# Patient Record
Sex: Male | Born: 1945 | Race: Black or African American | Hispanic: No | Marital: Single | Smoking: Light tobacco smoker
Health system: Southern US, Community
[De-identification: ages and names within clinical notes are randomized; demographics above are authoritative.]

## PROBLEM LIST (undated history)

## (undated) DIAGNOSIS — J449 Chronic obstructive pulmonary disease, unspecified: Secondary | ICD-10-CM

## (undated) DIAGNOSIS — I10 Essential (primary) hypertension: Secondary | ICD-10-CM

## (undated) DIAGNOSIS — N400 Enlarged prostate without lower urinary tract symptoms: Secondary | ICD-10-CM

## (undated) DIAGNOSIS — M1990 Unspecified osteoarthritis, unspecified site: Secondary | ICD-10-CM

## (undated) DIAGNOSIS — Z86010 Personal history of colonic polyps: Secondary | ICD-10-CM

## (undated) DIAGNOSIS — E119 Type 2 diabetes mellitus without complications: Secondary | ICD-10-CM

## (undated) DIAGNOSIS — E785 Hyperlipidemia, unspecified: Secondary | ICD-10-CM

## (undated) HISTORY — DX: Chronic obstructive pulmonary disease, unspecified: J44.9

## (undated) HISTORY — DX: Personal history of colonic polyps: Z86.010

## (undated) HISTORY — DX: Type 2 diabetes mellitus without complications: E11.9

## (undated) HISTORY — DX: Benign prostatic hyperplasia without lower urinary tract symptoms: N40.0

## (undated) HISTORY — DX: Unspecified osteoarthritis, unspecified site: M19.90

## (undated) HISTORY — DX: Hyperlipidemia, unspecified: E78.5

---

## 1994-08-13 HISTORY — PX: OTHER SURGICAL HISTORY: SHX169

## 2010-07-27 ENCOUNTER — Ambulatory Visit: Payer: Self-pay | Admitting: Internal Medicine

## 2010-07-27 DIAGNOSIS — I10 Essential (primary) hypertension: Secondary | ICD-10-CM | POA: Insufficient documentation

## 2010-07-27 DIAGNOSIS — J439 Emphysema, unspecified: Secondary | ICD-10-CM

## 2010-07-27 DIAGNOSIS — M1990 Unspecified osteoarthritis, unspecified site: Secondary | ICD-10-CM | POA: Insufficient documentation

## 2010-07-27 DIAGNOSIS — N400 Enlarged prostate without lower urinary tract symptoms: Secondary | ICD-10-CM

## 2010-07-27 DIAGNOSIS — E785 Hyperlipidemia, unspecified: Secondary | ICD-10-CM

## 2010-07-27 LAB — CONVERTED CEMR LAB
ALT: 15 units/L (ref 0–53)
BUN: 11 mg/dL (ref 6–23)
Basophils Absolute: 0.1 10*3/uL (ref 0.0–0.1)
Basophils Relative: 0.4 % (ref 0.0–3.0)
CO2: 28 meq/L (ref 19–32)
Calcium: 9.4 mg/dL (ref 8.4–10.5)
Cholesterol: 173 mg/dL (ref 0–200)
Creatinine, Ser: 1.1 mg/dL (ref 0.4–1.5)
Eosinophils Relative: 2.5 % (ref 0.0–5.0)
Glucose, Bld: 92 mg/dL (ref 70–99)
HDL: 34.8 mg/dL — ABNORMAL LOW (ref >39.00)
Lymphs Abs: 1.9 10*3/uL (ref 0.7–4.0)
MCV: 82.1 fL (ref 78.0–100.0)
Neutrophils Relative %: 75.5 % (ref 43.0–77.0)
RBC: 4.89 M/uL (ref 4.22–5.81)
Sodium: 141 meq/L (ref 135–145)
TSH: 0.63 microintl units/mL (ref 0.35–5.50)
Total Bilirubin: 0.6 mg/dL (ref 0.3–1.2)
Triglycerides: 66 mg/dL (ref 0.0–149.0)
WBC: 12.1 10*3/uL — ABNORMAL HIGH (ref 4.5–10.5)

## 2010-07-28 ENCOUNTER — Encounter: Payer: Self-pay | Admitting: Internal Medicine

## 2010-08-01 ENCOUNTER — Telehealth: Payer: Self-pay | Admitting: Internal Medicine

## 2010-08-01 ENCOUNTER — Ambulatory Visit: Payer: Self-pay | Admitting: Internal Medicine

## 2010-08-01 DIAGNOSIS — R911 Solitary pulmonary nodule: Secondary | ICD-10-CM | POA: Insufficient documentation

## 2010-08-24 ENCOUNTER — Ambulatory Visit
Admission: RE | Admit: 2010-08-24 | Discharge: 2010-08-24 | Payer: Self-pay | Source: Home / Self Care | Attending: Internal Medicine | Admitting: Internal Medicine

## 2010-08-24 DIAGNOSIS — F17200 Nicotine dependence, unspecified, uncomplicated: Secondary | ICD-10-CM | POA: Insufficient documentation

## 2010-08-29 ENCOUNTER — Ambulatory Visit: Payer: Self-pay | Admitting: Cardiology

## 2010-08-30 ENCOUNTER — Telehealth: Payer: Self-pay | Admitting: Internal Medicine

## 2010-09-06 ENCOUNTER — Other Ambulatory Visit: Payer: Self-pay | Admitting: Thoracic Surgery

## 2010-09-06 ENCOUNTER — Ambulatory Visit
Admission: RE | Admit: 2010-09-06 | Discharge: 2010-09-06 | Payer: Self-pay | Source: Home / Self Care | Attending: Thoracic Surgery | Admitting: Thoracic Surgery

## 2010-09-06 DIAGNOSIS — R918 Other nonspecific abnormal finding of lung field: Secondary | ICD-10-CM

## 2010-09-06 NOTE — Letter (Signed)
September 06, 2010  Sanda Linger, MD [address] [place] [address] [place] [address]  Re:  Jesse Garrison, KHAWAJA                  DOB:  Nov 16, 1945  Dear Dr. Yetta Barre:  I appreciate the opportunity of seeing the patient.  This 65 year old African American male has been a long time smoker who has recently just stopped smoking.  He had worked as a Naval architect.  He has had no hemoptysis, fever, chills, excessive sputum but his chest x-ray followed CT scan showed a right upper lobe lesion that was 18 x 15 cm in size. He also has severe emphysema on the right upper lobe as well as in the left upper lobe with a bullous type emphysema.  He gets short of breath when walking up a flight of stairs.  His medications include Ventolin HFA, terazosin, NicoDerm, Advair and Diovan HCTZ.  He is allergic to lisinopril.  He has hypertension, chronic obstructive pulmonary disease, osteoarthritis and BPH.  Family history is positive for diabetes.  SOCIAL HISTORY:  He is a Naval architect and is single.  He has four children.  He is retired.  Quit smoking 2 weeks ago, has 40-50 pack-year history.  Does not drink alcohol.  REVIEW OF SYSTEMS:  CONSTITUTIONAL:  He is 260 pounds.  He is 5 feet 9 inches. GENERAL:  His weight has been stable. CARDIAC:  He gets shortness of breath with exertion.  No angina or atrial fibrillation. PULMONARY:  No hemoptysis. GI:  No nausea, vomiting, constipation, diarrhea. GU:  No kidney disease, dysuria or frequent urination. VASCULAR:  No claudication, DVT, TIAs. NEUROLOGICAL:  No dizziness, headaches, blackouts, seizures. MUSCULOSKELETAL:  No arthritis. PSYCHIATRIC:  No depression or nervousness. EYES/ENT:  No changes in eyesight or hearing. HEMATOLOGICAL:  No problems with bleeding, clotting disorders, anemia.  PHYSICAL EXAMINATION:  He is a slightly obese African American male in no acute distress.  His blood pressure was 147/71, pulse 96, respirations 20, saturations were 93%.   Head, Eyes, Ears, Nose and Throat:  Unremarkable.  Neck:  Supple without thyromegaly.  There is no supraclavicular or axillary adenopathy.  Chest:  Distant breath sounds and bilateral wheezes.  Heart:  Regular sinus rhythm.  No murmurs. Abdomen:  Soft.  There is no hepatosplenomegaly.  Extremities:  Pulses are 2+.  There is no clubbing or edema.  Neurological:  He is oriented x3.  Sensory and motor intact.  Cranial nerves intact.  Hopefully, the patient has an early lung cancer.  We will plan to get PET scan on him, brain scan and pulmonary functions with DLCO.  At that time we will decide whether a biopsy is needed or not either by bronchoscopy or needle biopsy and then determine what his best treatment would be.  I appreciate the opportunity of seeing the patient.  Ines Bloomer, M.D. Electronically Signed  DPB/MEDQ  D:  09/06/2010  T:  09/06/2010  Job:  [id number]

## 2010-09-14 NOTE — Assessment & Plan Note (Signed)
Summary: New / [employer] / #./ cd   Vital Signs:  Patient profile:   65 year old male Height:      69 inches Weight:      251 pounds BMI:     37.20 O2 Sat:      93 % on Room air Temp:     98.5 degrees F oral Pulse rate:   78 / minute Pulse rhythm:   regular Resp:     16 per minute BP sitting:   166 / 80  (left arm) Cuff size:   large  Vitals Entered By: Rock Nephew CMA (July 27, 2010 3:53 PM)  Nutrition Counseling: Patient's BMI is greater than 25 and therefore counseled on weight management options.  O2 Flow:  Room air CC: New to establish  Does patient need assistance? Functional Status Self care Ambulation Normal   Primary Care Junella Domke:  Etta Grandchild MD  CC:  New to establish.  History of Present Illness: New to me he needs a new PCP, just moved to [place] fom [place]. He has a chronic NP cough and needs a nebulizer for albuterol/atrovent jet nebs.  He is having trouble with his right knee with chronic pain and now developing some instability.  Preventive Screening-Counseling & Management  Alcohol-Tobacco     Alcohol drinks/day: 0     Alcohol Counseling: not indicated; patient does not drink     Smoking Status: current     Smoking Cessation Counseling: yes     Smoke Cessation Stage: precontemplative     Packs/Day: 1.0     Year Started: 1961     Pack years: 68     Tobacco Counseling: to quit use of tobacco products  Caffeine-Diet-Exercise     Does Patient Exercise: no  Hep-HIV-STD-Contraception     Hepatitis Risk: no risk noted     HIV Risk: no risk noted     STD Risk: no risk noted      Sexual History:  currently monogamous.        Drug Use:  no.        Blood Transfusions:  no.    Medications Prior to Update: 1)  None  Current Medications (verified): 1)  Terazosin Hcl 5 Mg Caps (Terazosin Hcl) .... Take 1 Tablet By Mouth Once A Day 2)  Advair Diskus 250-50 Mcg/dose Aepb (Fluticasone-Salmeterol) .... One Puff Bid 3)  Diovan Hct 320-12.5 Mg Tabs  (Valsartan-Hydrochlorothiazide) .... One By Mouth Once Daily For High Blood Pressure 4)  Ventolin Hfa 108 (90 Base) Mcg/act Aers (Albuterol Sulfate) .Marland Kitchen.. 1-2 Puffs Qid As Needed For Wheezing  Allergies (verified): 1)  ! Lisinopril (Lisinopril)  Past History:  Past Medical History: COPD Hyperlipidemia Hypertension Osteoarthritis Benign prostatic hypertrophy  Past Surgical History: Denies surgical history  Family History: Family History Diabetes 1st degree relative  Social History: Occupation: truck Theme park manager Current Smoker Alcohol use-no Drug use-no Regular exercise-no Smoking Status:  current Drug Use:  no Does Patient Exercise:  no Packs/Day:  1.0 Hepatitis Risk:  no risk noted HIV Risk:  no risk noted STD Risk:  no risk noted Sexual History:  currently monogamous Blood Transfusions:  no  Review of Systems       The patient complains of weight gain, dyspnea on exertion, prolonged cough, and difficulty walking.  The patient denies anorexia, fever, weight loss, chest pain, syncope, peripheral edema, headaches, hemoptysis, abdominal pain, melena, hematochezia, severe indigestion/heartburn, hematuria, suspicious skin lesions, depression, unusual weight change, abnormal bleeding, enlarged lymph  nodes, and angioedema.   CV:  Complains of difficulty breathing at night and shortness of breath with exertion; denies chest pain or discomfort, difficulty breathing while lying down, fainting, fatigue, leg cramps with exertion, lightheadness, swelling of feet, swelling of hands, and weight gain. Resp:  Complains of cough, shortness of breath, and wheezing; denies chest discomfort, chest pain with inspiration, coughing up blood, excessive snoring, hypersomnolence, morning headaches, pleuritic, and sputum productive. GU:  Complains of urinary hesitancy; denies discharge, dysuria, hematuria, incontinence, nocturia, and urinary frequency.  Physical Exam  General:  alert,  well-developed, well-nourished, well-hydrated, appropriate dress, cooperative to examination, good hygiene, and overweight-appearing.   Head:  normocephalic, atraumatic, no abnormalities observed, and no abnormalities palpated.   Eyes:  vision grossly intact and pupils equal.   Mouth:  Oral mucosa and oropharynx without lesions or exudates.  Teeth in good repair. Neck:  supple, full ROM, no masses, no thyromegaly, no thyroid nodules or tenderness, no JVD, normal carotid upstroke, no carotid bruits, no cervical lymphadenopathy, and no neck tenderness.   Lungs:  He has diffuse bilateral exp rhonchi that clear with cough but good air movement, he is dyspneic with any activity. no intercostal retractions, no accessory muscle use, no dullness, no fremitus, no crackles, and no wheezes.   Heart:  normal rate, regular rhythm, no murmur, no gallop, no rub, and no JVD.   Abdomen:  soft, non-tender, normal bowel sounds, no distention, no masses, no guarding, no rigidity, no rebound tenderness, no abdominal hernia, no inguinal hernia, no hepatomegaly, and no splenomegaly.   Msk:  right knee has crepitance with ROM and obvious signs of DJD but no warmth or effusion and no instability Pulses:  R and L carotid,radial,femoral,dorsalis pedis and posterior tibial pulses are full and equal bilaterally Extremities:  No clubbing, cyanosis, edema, or deformity noted with normal full range of motion of all joints.   Neurologic:  alert & oriented X3, cranial nerves II-XII intact, strength normal in all extremities, sensation intact to light touch, sensation intact to pinprick, DTRs symmetrical and normal, finger-to-nose normal, heel-to-shin normal, and abnormal gait due to valgus deformity in right knee.   Skin:  turgor normal, color normal, no rashes, no suspicious lesions, no ecchymoses, no petechiae, no purpura, no ulcerations, and no edema.   Cervical Nodes:  no anterior cervical adenopathy and no posterior cervical  adenopathy.   Axillary Nodes:  no R axillary adenopathy and no L axillary adenopathy.   Inguinal Nodes:  no R inguinal adenopathy and no L inguinal adenopathy.   Psych:  Oriented X3, memory intact for recent and remote, normally interactive, good eye contact, not anxious appearing, not depressed appearing, not agitated, not suicidal, and not homicidal.     Impression & Recommendations:  Problem # 1:  COUGH (ICD-786.2) Assessment New stop the ACEI Orders: T-2 View CXR (71020TC) Venipuncture (40981) TLB-Lipid Panel (80061-LIPID) TLB-BMP (Basic Metabolic Panel-BMET) (80048-METABOL) TLB-Hepatic/Liver Function Pnl (80076-HEPATIC) TLB-TSH (Thyroid Stimulating Hormone) (84443-TSH) TLB-CBC Platelet - w/Differential (85025-CBCD) TLB-PSA (Prostate Specific Antigen) (84153-PSA)  Problem # 2:  OSTEOARTHRITIS (ICD-715.90) Assessment: New he may need right TKR Orders: Venipuncture (19147) TLB-Lipid Panel (80061-LIPID) TLB-BMP (Basic Metabolic Panel-BMET) (80048-METABOL) TLB-Hepatic/Liver Function Pnl (80076-HEPATIC) TLB-TSH (Thyroid Stimulating Hormone) (84443-TSH) TLB-CBC Platelet - w/Differential (85025-CBCD) TLB-PSA (Prostate Specific Antigen) (84153-PSA) Orthopedic Surgeon Referral (Ortho Surgeon)  Problem # 3:  BENIGN PROSTATIC HYPERTROPHY (ICD-600.00) Assessment: Unchanged  His updated medication list for this problem includes:    Terazosin Hcl 5 Mg Caps (Terazosin hcl) .Marland Kitchen... Take 1 tablet  by mouth once a day  Orders: Venipuncture (16109) TLB-Lipid Panel (80061-LIPID) TLB-BMP (Basic Metabolic Panel-BMET) (80048-METABOL) TLB-Hepatic/Liver Function Pnl (80076-HEPATIC) TLB-TSH (Thyroid Stimulating Hormone) (84443-TSH) TLB-CBC Platelet - w/Differential (85025-CBCD) TLB-PSA (Prostate Specific Antigen) (84153-PSA)  Problem # 4:  HYPERTENSION (ICD-401.9) Assessment: Deteriorated  The following medications were removed from the medication list:    Lisinopril 20 Mg Tabs  (Lisinopril) .Marland Kitchen... Take 1 tablet by mouth once a day    Hydrochlorothiazide 25 Mg Tabs (Hydrochlorothiazide) .Marland Kitchen... 1/2 once daily His updated medication list for this problem includes:    Terazosin Hcl 5 Mg Caps (Terazosin hcl) .Marland Kitchen... Take 1 tablet by mouth once a day    Diovan Hct 320-12.5 Mg Tabs (Valsartan-hydrochlorothiazide) ..... One by mouth once daily for high blood pressure  Orders: Venipuncture (60454) TLB-Lipid Panel (80061-LIPID) TLB-BMP (Basic Metabolic Panel-BMET) (80048-METABOL) TLB-Hepatic/Liver Function Pnl (80076-HEPATIC) TLB-TSH (Thyroid Stimulating Hormone) (84443-TSH) TLB-CBC Platelet - w/Differential (85025-CBCD) TLB-PSA (Prostate Specific Antigen) (84153-PSA)  BP today: 166/80  Problem # 5:  HYPERLIPIDEMIA (ICD-272.4) Assessment: Unchanged  Orders: Venipuncture (09811) TLB-Lipid Panel (80061-LIPID) TLB-BMP (Basic Metabolic Panel-BMET) (80048-METABOL) TLB-Hepatic/Liver Function Pnl (80076-HEPATIC) TLB-TSH (Thyroid Stimulating Hormone) (84443-TSH) TLB-CBC Platelet - w/Differential (85025-CBCD) TLB-PSA (Prostate Specific Antigen) (84153-PSA)  Complete Medication List: 1)  Terazosin Hcl 5 Mg Caps (Terazosin hcl) .... Take 1 tablet by mouth once a day 2)  Advair Diskus 250-50 Mcg/dose Aepb (Fluticasone-salmeterol) .... One puff bid 3)  Diovan Hct 320-12.5 Mg Tabs (Valsartan-hydrochlorothiazide) .... One by mouth once daily for high blood pressure 4)  Ventolin Hfa 108 (90 Base) Mcg/act Aers (Albuterol sulfate) .Marland Kitchen.. 1-2 puffs qid as needed for wheezing  Other Orders: Home Health Referral [facility]) Tdap => 21yrs IM (91478) Flu Vaccine 41yrs + MEDICARE PATIENTS (G9562) Administration Flu vaccine - MCR (Z3086)  Colorectal Screening:  Current Recommendations:    Colonoscopy recommended: patient refused  PSA Screening:    Reviewed PSA screening recommendations: PSA ordered  Immunization & Chemoprophylaxis:    Tetanus vaccine: Tdap  (07/27/2010)     Influenza vaccine: Fluvax MCR  (07/27/2010)  Patient Instructions: 1)  Please schedule a follow-up appointment in 1 month. 2)  Tobacco is very bad for your health and your loved ones! You Should stop smoking!. 3)  Stop Smoking Tips: Choose a Quit date. Cut down before the Quit date. decide what you will do as a substitute when you feel the urge to smoke(gum,toothpick,exercise). 4)  It is important that you exercise regularly at least 20 minutes 5 times a week. If you develop chest pain, have severe difficulty breathing, or feel very tired , stop exercising immediately and seek medical attention. 5)  You need to lose weight. Consider a lower calorie diet and regular exercise.  6)  Check your Blood Pressure regularly. If it is above 130/80: you should make an appointment. Prescriptions: ADVAIR DISKUS 250-50 MCG/DOSE AEPB (FLUTICASONE-SALMETEROL) One puff BID  #6 inhs x 0   Entered and Authorized by:   Etta Grandchild MD   Signed by:   Etta Grandchild MD on 07/27/2010   Method used:   Samples Given   RxID:   [id number] DIOVAN HCT 320-12.5 MG TABS (VALSARTAN-HYDROCHLOROTHIAZIDE) One by mouth once daily for high blood pressure  #112 x 0   Entered and Authorized by:   Etta Grandchild MD   Signed by:   Etta Grandchild MD on 07/27/2010   Method used:   Samples Given   RxID:   [id number]    Orders Added: 1)  Home Health  Referral [Home Health] 2)  T-2 View CXR [71020TC] 3)  Venipuncture [36415] 4)  TLB-Lipid Panel [80061-LIPID] 5)  TLB-BMP (Basic Metabolic Panel-BMET) [80048-METABOL] 6)  TLB-Hepatic/Liver Function Pnl [80076-HEPATIC] 7)  TLB-TSH (Thyroid Stimulating Hormone) [84443-TSH] 8)  TLB-CBC Platelet - w/Differential [85025-CBCD] 9)  TLB-PSA (Prostate Specific Antigen) [14782-NFA] 10)  Orthopedic Surgeon Referral [Ortho Surgeon] 11)  Tdap => 35yrs IM [90715] 12)  Flu Vaccine 25yrs + MEDICARE PATIENTS [Q2039] 13)  Administration Flu vaccine - MCR [G0008] 14)  New Patient  Level IV [21308]   Immunizations Administered:  Tetanus Vaccine:    Vaccine Type: Tdap    Site: right deltoid    Mfr: GlaxoSmithKline    Dose: 0.5 ml    Route: IM    Given by: Rock Nephew CMA    Exp. Date: 06/01/2012    Lot #: [id number]    VIS given: 06/30/08 version given July 27, 2010.   Immunizations Administered:  Tetanus Vaccine:    Vaccine Type: Tdap    Site: right deltoid    Mfr: GlaxoSmithKline    Dose: 0.5 ml    Route: IM    Given by: Rock Nephew CMA    Exp. Date: 06/01/2012    Lot #: [id number]    VIS given: 06/30/08 version given July 27, 2010.  Influenza Vaccine (to be given today)

## 2010-09-14 NOTE — Letter (Signed)
Summary: Results Follow-up Letter  [facility]  [address] [place], [place] [address]   Phone: [phone]  Fax: [phone]    07/28/2010  [address] [place], [place]  [address]  Dear Mr. JUSTEN,   The following are the results of your recent test(s):  Test     Result     CBC       WBC count is slightly elevated Liver/kidney   normal Prostate     normal Thyroid     normal   _________________________________________________________  Please call for an appointment soon _________________________________________________________ _________________________________________________________ _________________________________________________________  Sincerely,  Sanda Linger MD [facility]

## 2010-09-14 NOTE — Letter (Signed)
Summary: Lipid Letter  [facility]  [address] [place], [place] [address]   Phone: [phone]  Fax: [phone]    07/28/2010  Ka Flammer [address] [place], [place]  [address]  Dear Reuel Boom:  We have carefully reviewed your last lipid profile from 07/27/2010 and the results are noted below with a summary of recommendations for lipid management.    Cholesterol:       173     Goal: <200   HDL "good" Cholesterol:   01.02     Goal: >40   LDL "bad" Cholesterol:   125     Goal: <130   Triglycerides:       66.0     Goal: <150        TLC Diet (Therapeutic Lifestyle Change): Saturated Fats & Transfatty acids should be kept < 7% of total calories ***Reduce Saturated Fats Polyunstaurated Fat can be up to 10% of total calories Monounsaturated Fat Fat can be up to 20% of total calories Total Fat should be no greater than 25-35% of total calories Carbohydrates should be 50-60% of total calories Protein should be approximately 15% of total calories Fiber should be at least 20-30 grams a day ***Increased fiber may help lower LDL Total Cholesterol should be < 200mg /day Consider adding plant stanol/sterols to diet (example: Benacol spread) ***A higher intake of unsaturated fat may reduce Triglycerides and Increase HDL    Adjunctive Measures (may lower LIPIDS and reduce risk of Heart Attack) include: Aerobic Exercise (20-30 minutes 3-4 times a week) Limit Alcohol Consumption Weight Reduction Aspirin 75-81 mg a day by mouth (if not allergic or contraindicated) Dietary Fiber 20-30 grams a day by mouth     Current Medications: 1)    Terazosin Hcl 5 Mg Caps (Terazosin hcl) .... Take 1 tablet by mouth once a day 2)    Advair Diskus 250-50 Mcg/dose Aepb (Fluticasone-salmeterol) .... One puff bid 3)    Diovan Hct 320-12.5 Mg Tabs (Valsartan-hydrochlorothiazide) .... One by mouth once daily for high blood pressure 4)    Ventolin Hfa 108 (90 Base) Mcg/act Aers (Albuterol  sulfate) .Marland Kitchen.. 1-2 puffs qid as needed for wheezing  If you have any questions, please call. We appreciate being able to work with you.   Sincerely,    [facility] Etta Grandchild MD

## 2010-09-14 NOTE — Progress Notes (Signed)
  Phone Note Outgoing Call   Summary of Call: LA - please let him know that he needs to see a chest surgeon Initial call taken by: Etta Grandchild MD,  August 30, 2010 7:30 AM     Appended Document:  Patient notified per MD. I advised to come in for follow up to discuss however pt stated that he is starting a new job and doesnt have copay. I advised of report and pt will wait until they call with referral info.Alvy Beal A, CMA

## 2010-09-14 NOTE — Assessment & Plan Note (Signed)
Summary: 1 MOS F/U / #/ CD   Vital Signs:  Patient profile:   65 year old male Height:      69 inches Weight:      245 pounds O2 Sat:      94 % on Room air Temp:     98.7 degrees F oral Pulse rate:   80 / minute Pulse rhythm:   regular Resp:     16 per minute BP sitting:   120 / 58  (left arm)  O2 Flow:  Room air  Primary Care Provider:  Etta Grandchild MD   History of Present Illness: He returns for f/up and tells me that he is feeling at his baseline level. He has not received his nebulizer yet b/c [employer] did not approve the provider that we referred him to. His CT scan is scheduled for today.  Hypertension History:      He denies headache, chest pain, palpitations, dyspnea with exertion, orthopnea, PND, peripheral edema, visual symptoms, neurologic problems, syncope, and side effects from treatment.  He notes no problems with any antihypertensive medication side effects.        Positive major cardiovascular risk factors include male age 1 years old or older, hyperlipidemia, hypertension, and current tobacco user.  Negative major cardiovascular risk factors include no history of diabetes.        Further assessment for target organ damage reveals no history of ASHD, cardiac end-organ damage (CHF/LVH), stroke/TIA, peripheral vascular disease, renal insufficiency, or hypertensive retinopathy.     Preventive Screening-Counseling & Management  Alcohol-Tobacco     Smoking Cessation Counseling: yes  Allergies: 1)  ! Lisinopril (Lisinopril)  Past History:  Past Medical History: Last updated: 07/27/2010 COPD Hyperlipidemia Hypertension Osteoarthritis Benign prostatic hypertrophy  Past Surgical History: Last updated: 07/27/2010 Denies surgical history  Family History: Last updated: 07/27/2010 Family History Diabetes 1st degree relative  Social History: Last updated: 07/27/2010 Occupation: truck driver Single Current Smoker Alcohol use-no Drug use-no Regular  exercise-no  Risk Factors: Alcohol Use: 0 (07/27/2010) Exercise: no (07/27/2010)  Risk Factors: Smoking Status: current (07/27/2010) Packs/Day: 1.0 (07/27/2010)  Family History: Reviewed history from 07/27/2010 and no changes required. Family History Diabetes 1st degree relative  Social History: Reviewed history from 07/27/2010 and no changes required. 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  The patient denies anorexia, fever, weight loss, chest pain, syncope, peripheral edema, prolonged cough, headaches, hemoptysis, abdominal pain, hematuria, suspicious skin lesions, unusual weight change, enlarged lymph nodes, and angioedema.   Resp:  Complains of shortness of breath and wheezing; denies chest discomfort, chest pain with inspiration, cough, coughing up blood, excessive snoring, and sputum productive. GU:  Complains of urinary hesitancy; denies dysuria, hematuria, incontinence, nocturia, and urinary frequency.  Physical Exam  General:  alert, well-developed, well-nourished, well-hydrated, appropriate dress, cooperative to examination, good hygiene, and overweight-appearing.   Head:  normocephalic, atraumatic, no abnormalities observed, and no abnormalities palpated.   Neck:  supple, full ROM, no masses, no thyromegaly, no thyroid nodules or tenderness, no JVD, normal carotid upstroke, no carotid bruits, no cervical lymphadenopathy, and no neck tenderness.   Lungs:  He has diffuse bilateral exp rhonchi that clear with cough but good air movement, he is dyspneic with any activity. no intercostal retractions, no accessory muscle use, no dullness, no fremitus, no crackles, and no wheezes.   Heart:  normal rate, regular rhythm, no murmur, no gallop, no rub, and no JVD.   Abdomen:  soft, non-tender, normal bowel sounds, no distention, no masses,  no guarding, no rigidity, no rebound tenderness, no abdominal hernia, no inguinal hernia, no hepatomegaly, and no splenomegaly.   Msk:  right knee has crepitance with ROM and obvious signs of DJD but no warmth or effusion and no instability Pulses:  R and L carotid,radial,femoral,dorsalis pedis and posterior tibial pulses are full and equal bilaterally Extremities:  No clubbing, cyanosis, edema, or deformity noted with normal full range of motion of all joints.   Neurologic:  alert & oriented X3, cranial nerves II-XII intact, strength normal in all extremities, sensation intact to light touch, sensation intact to pinprick, DTRs symmetrical and normal, finger-to-nose normal, heel-to-shin normal, and abnormal gait due to valgus deformity in right knee.   Skin:  turgor normal, color normal, no rashes, no suspicious lesions, no ecchymoses, no petechiae, no purpura, no ulcerations, and no edema.   Cervical Nodes:  no anterior cervical adenopathy and no posterior cervical adenopathy.   Psych:  Oriented X3, memory intact for recent and remote, normally interactive, good eye contact, not anxious appearing, not depressed appearing, not agitated, not suicidal, and not homicidal.     Impression & Recommendations:  Problem # 1:  TOBACCO USE (ICD-305.1) Assessment New  His updated medication list for this problem includes:    Nicoderm Cq 21 Mg/24hr Pt24 (Nicotine) .Marland Kitchen... Apply one once daily for 2 weeks    Nicoderm Cq 14 Mg/24hr Pt24 (Nicotine) .Marland Kitchen... Apply one once daily for 14 days    Nicoderm Cq 7 Mg/24hr Pt24 (Nicotine) .Marland Kitchen... Apply one once daily for 14 days  Encouraged smoking cessation and discussed different methods for smoking cessation.   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 Take 1 tablet by mouth once a day  Problem # 4:  HYPERTENSION (ICD-401.9) Assessment: Improved  His updated medication list for this problem includes:    Terazosin Hcl 5 Mg Caps (Terazosin hcl) .Marland Kitchen... Take 1 tablet by mouth once a day    Diovan Hct 320-12.5 Mg Tabs (Valsartan-hydrochlorothiazide) ..... One by mouth once daily for high blood pressure  Orders: Tobacco use cessation intermediate 3-10 minutes (99406)  BP today: 120/58 Prior BP: 166/80 (07/27/2010)  10 Yr Risk Heart Disease: 33 %  Labs Reviewed: K+: 4.3 (07/27/2010) Creat: : 1.1 (07/27/2010)   Chol: 173 (07/27/2010)   HDL: 34.80 (07/27/2010)   LDL: 125 (07/27/2010)   TG: 66.0 (07/27/2010)  Problem # 5:  COPD (ICD-496) Assessment: Unchanged  His updated medication list for this problem includes:    Advair Diskus 250-50 Mcg/dose Aepb (Fluticasone-salmeterol) ..... One puff bid    Ventolin Hfa 108 (90 Base) Mcg/act Aers (Albuterol sulfate) .Marland Kitchen... 1-2 puffs qid as needed for wheezing  Orders: Tobacco use cessation intermediate 3-10 minutes (99406) Home Health Referral (Home Health)  Complete Medication List: 1)  Terazosin Hcl 5 Mg Caps (Terazosin hcl) .... Take 1 tablet by mouth once a day 2)  Advair Diskus 250-50 Mcg/dose Aepb (Fluticasone-salmeterol) .... One puff bid 3)  Diovan Hct 320-12.5 Mg Tabs (Valsartan-hydrochlorothiazide) .... One by mouth once daily for high blood pressure 4)  Ventolin Hfa 108 (  90 Base) Mcg/act Aers (Albuterol sulfate) .Marland Kitchen.. 1-2 puffs qid as needed for wheezing 5)  Nicoderm Cq 21 Mg/24hr Pt24 (Nicotine) .... Apply one once daily for 2 weeks 6)  Nicoderm Cq 14 Mg/24hr Pt24 (Nicotine) .... Apply one once daily for 14 days 7)  Nicoderm Cq 7 Mg/24hr Pt24 (Nicotine) .... Apply one once daily for 14 days  Hypertension Assessment/Plan:      The patient's hypertensive risk group is category B: At  least one risk factor (excluding diabetes) with no target organ damage.  His calculated 10 year risk of coronary heart disease is 33 %.  Today's blood pressure is 120/58.  His blood pressure goal is < 140/90.  Patient Instructions: 1)  Please schedule a follow-up appointment in 2 months. 2)  Tobacco is very bad for your health and your loved ones! You Should stop smoking!. 3)  Stop Smoking Tips: Choose a Quit date. Cut down before the Quit date. decide what you will do as a substitute when you feel the urge to smoke(gum,toothpick,exercise). 4)  It is important that you exercise regularly at least 20 minutes 5 times a week. If you develop chest pain, have severe difficulty breathing, or feel very tired , stop exercising immediately and seek medical attention. 5)  You need to lose weight. Consider a lower calorie diet and regular exercise.  6)  Check your Blood Pressure regularly. If it is above 140/90: you should make an appointment. Prescriptions: VENTOLIN HFA 108 (90 BASE) MCG/ACT AERS (ALBUTEROL SULFATE) 1-2 puffs QID as needed for wheezing  #1 inh x 11   Entered and Authorized by:   Etta Grandchild MD   Signed by:   Etta Grandchild MD on 08/24/2010   Method used:   Electronically to        [facility]  [address] [id number] (retail)       [address]       [place], [place]  [address]       Ph: [phone] or [phone]       Fax: [phone]   RxID:   [id number] TERAZOSIN HCL 5 MG CAPS (TERAZOSIN HCL) Take 1 tablet by mouth once a day  #30 x 11   Entered and Authorized by:   Etta Grandchild MD   Signed by:   Etta Grandchild MD on 08/24/2010   Method used:   Electronically to        [facility]  [address] [id number] (ret[facility])       [address]       [place], [place]  [address]       Ph: [phone] or [phone]       Fax: [phone]   RxID:   [id number] NICODERM CQ 7 MG/24HR PT24 (NICOTINE) Apply one once daily for 14 days  #14 x 0   Entered  and Authorized by:   Etta Grandchild MD   Signed by:   Etta Grandchild MD on 08/24/2010   Method used:   [facility]ronically to        [facility]  [address] [id number] ([facility]       [address]       [place], [place]  [address]       Ph: [phone] or [phone]       Fax: [phone]   RxID:   [id number] NICODERM CQ 14 MG/24HR  PT24 (NICOTINE) apply one once daily for 14 days  #14 x 0   Entered and Authorized by:   Etta Grandchild MD   Signed by:   Etta Grandchild MD on 08/24/2010   Method used:   [facility]ronically to        [facility]  [address] [id number] ([facility]       [address]       [place], [place]  [address]       Ph: [phone] or [phone]       Fax: [phone]   RxID:   [id number] NICODERM CQ 21 MG/24HR PT24 (NICOTINE) apply one once daily for 2 weeks  #14 x 0   Entered and Authorized by:   Etta Grandchild MD   Signed by:   Etta Grandchild MD on 08/24/2010   Method used:   [facility]ronically to        [facility]  [address] [id number]       [address]       [place], [place]  [address]       Ph: [phone] or [phone]       Fax: [phone]   RxID:   [id number]    Orders Added: 1)  Tobacco use cessation intermediate 3-10 minutes [99406] 2)  Home Health Referral [facility]] 3)  Radiology Referral [Radiology] 4)  Est. Patient Level IV [00867]

## 2010-09-14 NOTE — Progress Notes (Signed)
     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

## 2010-09-15 ENCOUNTER — Encounter (HOSPITAL_COMMUNITY): Payer: Self-pay

## 2010-09-15 ENCOUNTER — Encounter (HOSPITAL_COMMUNITY)
Admission: RE | Admit: 2010-09-15 | Discharge: 2010-09-15 | Disposition: A | Discharge status: Home / Self Care | Payer: Medicare PPO | Source: Ambulatory Visit | Attending: Thoracic Surgery | Admitting: Thoracic Surgery

## 2010-09-15 ENCOUNTER — Ambulatory Visit (HOSPITAL_COMMUNITY)
Admission: RE | Admit: 2010-09-15 | Discharge: 2010-09-15 | Disposition: A | Discharge status: Home / Self Care | Payer: Medicare PPO | Source: Ambulatory Visit | Attending: Thoracic Surgery | Admitting: Thoracic Surgery

## 2010-09-15 DIAGNOSIS — R222 Localized swelling, mass and lump, trunk: Secondary | ICD-10-CM | POA: Insufficient documentation

## 2010-09-15 DIAGNOSIS — J449 Chronic obstructive pulmonary disease, unspecified: Secondary | ICD-10-CM | POA: Insufficient documentation

## 2010-09-15 DIAGNOSIS — R0989 Other specified symptoms and signs involving the circulatory and respiratory systems: Secondary | ICD-10-CM | POA: Insufficient documentation

## 2010-09-15 DIAGNOSIS — J988 Other specified respiratory disorders: Secondary | ICD-10-CM | POA: Insufficient documentation

## 2010-09-15 DIAGNOSIS — I10 Essential (primary) hypertension: Secondary | ICD-10-CM | POA: Insufficient documentation

## 2010-09-15 DIAGNOSIS — R0609 Other forms of dyspnea: Secondary | ICD-10-CM | POA: Insufficient documentation

## 2010-09-15 DIAGNOSIS — J984 Other disorders of lung: Secondary | ICD-10-CM | POA: Insufficient documentation

## 2010-09-15 DIAGNOSIS — D381 Neoplasm of uncertain behavior of trachea, bronchus and lung: Secondary | ICD-10-CM | POA: Insufficient documentation

## 2010-09-15 DIAGNOSIS — Z79899 Other long term (current) drug therapy: Secondary | ICD-10-CM | POA: Insufficient documentation

## 2010-09-15 DIAGNOSIS — R062 Wheezing: Secondary | ICD-10-CM | POA: Insufficient documentation

## 2010-09-15 DIAGNOSIS — F17200 Nicotine dependence, unspecified, uncomplicated: Secondary | ICD-10-CM | POA: Insufficient documentation

## 2010-09-15 DIAGNOSIS — R918 Other nonspecific abnormal finding of lung field: Secondary | ICD-10-CM

## 2010-09-15 DIAGNOSIS — J4489 Other specified chronic obstructive pulmonary disease: Secondary | ICD-10-CM | POA: Insufficient documentation

## 2010-09-15 DIAGNOSIS — E7800 Pure hypercholesterolemia, unspecified: Secondary | ICD-10-CM | POA: Insufficient documentation

## 2010-09-15 HISTORY — DX: Essential (primary) hypertension: I10

## 2010-09-15 MED ORDER — IOHEXOL 300 MG/ML  SOLN
100.0000 mL | Freq: Once | INTRAMUSCULAR | Status: AC | PRN
  Administered 2010-09-15: 100 mL via INTRAVENOUS

## 2010-09-15 MED ORDER — FLUDEOXYGLUCOSE F - 18 (FDG) INJECTION: 17.1000 | Freq: Once | INTRAVENOUS | Status: DC | PRN

## 2010-09-19 ENCOUNTER — Ambulatory Visit (INDEPENDENT_AMBULATORY_CARE_PROVIDER_SITE_OTHER): Payer: Medicare PPO | Admitting: Thoracic Surgery

## 2010-09-19 ENCOUNTER — Encounter: Payer: Self-pay | Admitting: Internal Medicine

## 2010-09-19 DIAGNOSIS — D381 Neoplasm of uncertain behavior of trachea, bronchus and lung: Secondary | ICD-10-CM

## 2010-09-22 NOTE — Letter (Signed)
September 19, 2010  Sanda Linger, MD [address] [place] [address] [place] [address]  Re:  SHAARAV, RIPPLE                  DOB:  Feb 20, 1946  Dear Dr. Yetta Barre:  I saw the patient back after his PET scan, brain scan and pulmonary function tests.  His brain scan was negative.  His pulmonary function tests showed an FEV-1 of 1.07 which is 37% of predicted with a diffusion capacity of 36% of predicted, but he has obviously severe obstructive pulmonary disease with decreased diffusion capacity.  His PET scan, however, did not show any uptake in the lesion which could go along, this could be some type of either a carcinoid or a hamartoma or possibly a low-grade cancer.  Given his poor pulmonary function tests and that he has only quit smoking for 1 month, I think it would be safe to watch him and I will see him back in 3 months and at that time if there is any change in this nodule, then we will proceed with some type of biopsy probably we will use electromagnetic navigation bronchoscopy.  His blood pressure was 141/69, pulse 80, respirations 18, sats were 91%.  Ines Bloomer, M.D. Electronically Signed  DPB/MEDQ  D:  09/19/2010  T:  09/20/2010  Job:  [id number]

## 2010-10-10 NOTE — Letter (Signed)
Summary: [facility]  [facility]   Imported By: Lennie Odor 10/04/2010 12:00:39  _____________________________________________________________________  External Attachment:    Type:   Image     Comment:   External Document

## 2010-11-17 ENCOUNTER — Other Ambulatory Visit: Payer: Self-pay | Admitting: Thoracic Surgery

## 2010-11-17 DIAGNOSIS — R911 Solitary pulmonary nodule: Secondary | ICD-10-CM

## 2010-12-06 ENCOUNTER — Other Ambulatory Visit: Payer: Self-pay | Admitting: *Deleted

## 2010-12-06 MED ORDER — VALSARTAN-HYDROCHLOROTHIAZIDE 320-12.5 MG PO TABS: 1.0000 | ORAL_TABLET | Freq: Every day | ORAL | Status: DC

## 2010-12-20 ENCOUNTER — Ambulatory Visit: Payer: Medicare PPO | Admitting: Thoracic Surgery

## 2010-12-20 ENCOUNTER — Other Ambulatory Visit: Payer: Medicare PPO

## 2011-04-12 ENCOUNTER — Other Ambulatory Visit (INDEPENDENT_AMBULATORY_CARE_PROVIDER_SITE_OTHER): Payer: Medicare PPO

## 2011-04-12 ENCOUNTER — Encounter: Payer: Self-pay | Admitting: Internal Medicine

## 2011-04-12 ENCOUNTER — Ambulatory Visit (INDEPENDENT_AMBULATORY_CARE_PROVIDER_SITE_OTHER)
Admission: RE | Admit: 2011-04-12 | Discharge: 2011-04-12 | Disposition: A | Discharge status: Home / Self Care | Payer: Medicare PPO | Source: Ambulatory Visit | Attending: Internal Medicine | Admitting: Internal Medicine

## 2011-04-12 ENCOUNTER — Ambulatory Visit (INDEPENDENT_AMBULATORY_CARE_PROVIDER_SITE_OTHER): Payer: Medicare PPO | Admitting: Internal Medicine

## 2011-04-12 VITALS — BP 124/60 | HR 72 | Temp 98.2°F | Resp 16 | Wt 250.0 lb

## 2011-04-12 DIAGNOSIS — I10 Essential (primary) hypertension: Secondary | ICD-10-CM

## 2011-04-12 DIAGNOSIS — R222 Localized swelling, mass and lump, trunk: Secondary | ICD-10-CM

## 2011-04-12 DIAGNOSIS — J4489 Other specified chronic obstructive pulmonary disease: Secondary | ICD-10-CM

## 2011-04-12 DIAGNOSIS — R05 Cough: Secondary | ICD-10-CM

## 2011-04-12 DIAGNOSIS — J449 Chronic obstructive pulmonary disease, unspecified: Secondary | ICD-10-CM

## 2011-04-12 DIAGNOSIS — R059 Cough, unspecified: Secondary | ICD-10-CM

## 2011-04-12 LAB — COMPREHENSIVE METABOLIC PANEL
AST: 19 U/L (ref 0–37)
Alkaline Phosphatase: 56 U/L (ref 39–117)
BUN: 20 mg/dL (ref 6–23)
Creatinine, Ser: 1.4 mg/dL (ref 0.4–1.5)
Potassium: 3.9 mEq/L (ref 3.5–5.1)
Total Bilirubin: 0.7 mg/dL (ref 0.3–1.2)

## 2011-04-12 LAB — CBC WITH DIFFERENTIAL/PLATELET
Basophils Relative: 0.3 % (ref 0.0–3.0)
Eosinophils Absolute: 0.3 10*3/uL (ref 0.0–0.7)
MCHC: 32.5 g/dL (ref 30.0–36.0)
MCV: 80.9 fl (ref 78.0–100.0)
Monocytes Absolute: 0.7 10*3/uL (ref 0.1–1.0)
Neutrophils Relative %: 74.8 % (ref 43.0–77.0)
RBC: 5.27 Mil/uL (ref 4.22–5.81)

## 2011-04-12 MED ORDER — ROFLUMILAST 500 MCG PO TABS: 500.0000 ug | ORAL_TABLET | Freq: Every day | ORAL | Status: DC

## 2011-04-12 MED ORDER — ALBUTEROL SULFATE HFA 108 (90 BASE) MCG/ACT IN AERS: 2.0000 | INHALATION_SPRAY | Freq: Four times a day (QID) | RESPIRATORY_TRACT | Status: DC

## 2011-04-12 NOTE — Progress Notes (Signed)
Subjective:    Patient ID: Jesse Garrison, male    DOB: Jun 02, 1946, 65 y.o.   MRN: [id number]  Cough This is a recurrent problem. The current episode started more than 1 year ago. The problem has been gradually worsening. The problem occurs every few hours. The cough is productive of sputum. Pertinent negatives include no chest pain, chills, ear congestion, ear pain, fever, headaches, heartburn, hemoptysis, myalgias, nasal congestion, postnasal drip, rash, rhinorrhea, sore throat, shortness of breath, sweats, weight loss or wheezing. The symptoms are aggravated by fumes. Risk factors for lung disease include smoking/tobacco exposure. He has tried steroid inhaler and a beta-agonist inhaler for the symptoms. The treatment provided mild relief. His past medical history is significant for COPD and emphysema. There is no history of asthma, bronchiectasis, bronchitis, environmental allergies or pneumonia.  Hypertension This is a chronic problem. The current episode started more than 1 year ago. The problem has been gradually improving since onset. The problem is controlled. Pertinent negatives include no anxiety, blurred vision, chest pain, headaches, malaise/fatigue, neck pain, orthopnea, palpitations, peripheral edema, PND, shortness of breath or sweats. There are no associated agents to hypertension. Past treatments include diuretics and angiotensin blockers. The current treatment provides significant improvement. Compliance problems include exercise and diet.       Review of Systems  Constitutional: Negative for fever, chills, weight loss, malaise/fatigue, diaphoresis, activity change, appetite change, fatigue and unexpected weight change.  HENT: Negative for ear pain, sore throat, rhinorrhea, trouble swallowing, neck pain, voice change and postnasal drip.   Eyes: Negative.  Negative for blurred vision.  Respiratory: Positive for cough. Negative for apnea, hemoptysis, choking, chest tightness, shortness  of breath, wheezing and stridor.   Cardiovascular: Negative for chest pain, palpitations, orthopnea, leg swelling and PND.  Gastrointestinal: Negative for heartburn, nausea, vomiting, abdominal pain, diarrhea, constipation, blood in stool, abdominal distention, anal bleeding and rectal pain.  Genitourinary: Negative for dysuria, urgency, frequency, hematuria, flank pain, decreased urine volume, enuresis and difficulty urinating.  Musculoskeletal: Negative for myalgias, back pain, joint swelling, arthralgias and gait problem.  Skin: Negative for color change, pallor, rash and wound.  Neurological: Negative for dizziness, tremors, seizures, syncope, facial asymmetry, speech difficulty, weakness, light-headedness, numbness and headaches.  Hematological: Negative for environmental allergies and adenopathy. Does not bruise/bleed easily.  Psychiatric/Behavioral: Negative.        Objective:   Physical Exam  Vitals reviewed. Constitutional: He is oriented to person, place, and time. He appears well-developed and well-nourished. No distress.  HENT:  Right Ear: External ear normal.  Mouth/Throat: Oropharynx is clear and moist. No oropharyngeal exudate.  Eyes: Conjunctivae are normal. Right eye exhibits no discharge. Left eye exhibits no discharge. No scleral icterus.  Neck: Normal range of motion. Neck supple. No JVD present. No tracheal deviation present. No thyromegaly present.  Cardiovascular: Normal rate, regular rhythm, normal heart sounds and intact distal pulses.  Exam reveals no gallop and no friction rub.   No murmur heard. Pulmonary/Chest: Effort normal. No accessory muscle usage or stridor. Not tachypneic. No respiratory distress. He has no decreased breath sounds. He has wheezes in the right middle field and the left middle field. He has rhonchi in the right middle field and the left middle field. He has no rales. He exhibits no tenderness.  Abdominal: Soft. Bowel sounds are normal. He  exhibits no distension. There is no tenderness. There is no rebound and no guarding.  Musculoskeletal: Normal range of motion. He exhibits no edema and no tenderness.  Lymphadenopathy:  He has no cervical adenopathy.  Neurological: He is alert and oriented to person, place, and time. He has normal reflexes. He displays normal reflexes. He exhibits normal muscle tone.  Skin: Skin is warm and dry. No rash noted. He is not diaphoretic. No erythema. No pallor.  Psychiatric: He has a normal mood and affect. His behavior is normal. Judgment and thought content normal.      Lab Results  Component Value Date   WBC 12.1* 07/27/2010   HGB 13.4 07/27/2010   HCT 40.1 07/27/2010   PLT 180.0 07/27/2010   CHOL 173 07/27/2010   TRIG 66.0 07/27/2010   HDL 34.80* 07/27/2010   ALT 15 07/27/2010   AST 18 07/27/2010   NA 141 07/27/2010   K 4.3 07/27/2010   CL 106 07/27/2010   CREATININE 1.1 07/27/2010   BUN 11 07/27/2010   CO2 28 07/27/2010   TSH 0.63 07/27/2010   PSA 2.97 07/27/2010      Assessment & Plan:

## 2011-04-12 NOTE — Assessment & Plan Note (Signed)
His BP is well controlled, I will check his lytes and renal function today 

## 2011-04-12 NOTE — Assessment & Plan Note (Signed)
I will check a cxr today to look for pna

## 2011-04-12 NOTE — Assessment & Plan Note (Signed)
His symptoms continue to worsen in part due to non-compliance with the advair to due the cost and in part due to the continued abuse of cigarettes. I gave him samples of advair diskus today and asked him to stop smoking

## 2011-04-12 NOTE — Patient Instructions (Signed)
Chronic Obstructive Pulmonary Disease (COPD) Chronic obstructive pulmonary disease (COPD) is a condition in which airflow from the lungs is restricted. The lungs can never return to normal, but there are measures you can take which will improve them and make you feel better. CAUSES  Smoking.   Breathing in irritants (pollution, cigarette smoke, strong odors, aerosol sprays, paint fumes).   History of lung infections.  TREATMENT  Treatment focuses on making you comfortable (supportive care).  HOME CARE INSTRUCTIONS  If you smoke, stop smoking. The carbon monoxide buildup in the blood robs you of your already short oxygen supply.   Take medicines (antibiotics) that kill germs as directed.   Avoid antihistamines and cough syrups. They dry up your system and slow down the elimination of secretions. This decreases respiratory capacity and may lead to infections.   Drink enough water and fluids to keep your urine clear or pale yellow. This loosens secretions.   Use humidifiers at home and at your bedside if they do not make breathing difficult.   Receive all protective vaccines your caregiver suggests, especially pneumococcal and influenza.   Use home oxygen as suggested.  SEEK MEDICAL CARE IF:  You develop pus-like mucus (sputum).   You have an oral temperature above 100.5.   Breathing is more labored or exercise becomes difficult to do.   You are running out of the medicine you take for your breathing.  SEEK IMMEDIATE MEDICAL CARE IF:  You have a rapid heart rate.   You have agitation, confusion, tremors, or are in a stupor (family members may need to observe this).   It becomes difficult to breathe.   You develop chest pain.  You have an oral temperature above 100.5Hypertension (High Blood Pressure) As your heart beats, it forces blood through your arteries. This force is your blood pressure. If the pressure is too high, it is called hypertension (HTN) or high blood  pressure. HTN is dangerous because you may have it and not know it. High blood pressure may mean that your heart has to work harder to pump blood. Your arteries may be narrow or stiff. The extra work puts you at risk for heart disease, stroke, and other problems.  Blood pressure consists of two numbers, a higher number over a lower, 110/72, for example. It is stated as "110 over 72." The ideal is below 120 for the top number (systolic) and under 80 for the bottom (diastolic). Write down your blood pressure today. You should pay close attention to your blood pressure if you have certain conditions such as: Heart failure. Prior heart attack.  Diabetes  Chronic kidney disease.  Prior stroke.  Multiple risk factors for heart disease.   To see if you have HTN, your blood pressure should be measured while you are seated with your arm held at the level of the heart. It should be measured at least twice. A one-time elevated blood pressure reading (especially in the Emergency Department) does not mean that you need treatment. There may be conditions in which the blood pressure is different between your right and left arms. It is important to see your caregiver soon for a recheck. Most people have essential hypertension which means that there is not a specific cause. This type of high blood pressure may be lowered by changing lifestyle factors such as: Stress. Smoking.  Lack of exercise.  Excessive weight. Drug/tobacco/alcohol use.  Eating less salt.   Most people do not have symptoms from high blood pressure until it  has caused damage to the body. Effective treatment can often prevent, delay or reduce that damage. TREATMENT Treatment for high blood pressure, when a cause has been identified, is directed at the cause. There are a large number of medications to treat HTN. These fall into several categories, and your caregiver will help you select the medicines that are best for you. Medications may have side  effects. You should review side effects with your caregiver. If your blood pressure stays high after you have made lifestyle changes or started on medicines,  Your medication(s) may need to be changed.  Other problems may need to be addressed.  Be certain you understand your prescriptions, and know how and when to take your medicine.  Be sure to follow up with your caregiver within the time frame advised (usually within two weeks) to have your blood pressure rechecked and to review your medications.  If you are taking more than one medicine to lower your blood pressure, make sure you know how and at what times they should be taken. Taking two medicines at the same time can result in blood pressure that is too low.  SEEK IMMEDIATE MEDICAL CARE IF YOU DEVELOP: A severe headache, blurred or changing vision, or confusion.  Unusual weakness or numbness, or a faint feeling.  Severe chest or abdominal pain, vomiting, or breathing problems.  MAKE SURE YOU:  Understand these instructions.  Will watch your condition.  Will get help right away if you are not doing well or get worse.  Document Released: 07/30/2005 Document Re-Released: 01/17/2010  [facility] Patient Information 2011 [place], [place]., not controlled by medicine.  MAKE SURE YOU:   Understand these instructions.   Will watch your condition.   Will get help right away if you are not doing well or get worse.  Document Released: 05/09/2005 Document Re-Released: 10/24/2009 [facility] Patient Information 2011 [place], [place].

## 2011-04-12 NOTE — Assessment & Plan Note (Signed)
He is due for a f/up cxr and CT scan

## 2011-06-19 ENCOUNTER — Telehealth: Payer: Self-pay

## 2011-06-19 MED ORDER — VALSARTAN-HYDROCHLOROTHIAZIDE 320-12.5 MG PO TABS: 1.0000 | ORAL_TABLET | Freq: Every day | ORAL | Status: DC

## 2011-06-19 NOTE — Telephone Encounter (Signed)
refill 

## 2011-07-19 ENCOUNTER — Ambulatory Visit (INDEPENDENT_AMBULATORY_CARE_PROVIDER_SITE_OTHER)
Admission: RE | Admit: 2011-07-19 | Discharge: 2011-07-19 | Disposition: A | Discharge status: Home / Self Care | Payer: Medicare PPO | Source: Ambulatory Visit | Attending: Internal Medicine | Admitting: Internal Medicine

## 2011-07-19 ENCOUNTER — Encounter: Payer: Self-pay | Admitting: Internal Medicine

## 2011-07-19 ENCOUNTER — Other Ambulatory Visit (INDEPENDENT_AMBULATORY_CARE_PROVIDER_SITE_OTHER): Payer: Medicare PPO

## 2011-07-19 ENCOUNTER — Ambulatory Visit (INDEPENDENT_AMBULATORY_CARE_PROVIDER_SITE_OTHER): Payer: Medicare PPO | Admitting: Internal Medicine

## 2011-07-19 VITALS — BP 130/64 | HR 76 | Temp 97.1°F | Resp 16 | Wt 248.0 lb

## 2011-07-19 DIAGNOSIS — E785 Hyperlipidemia, unspecified: Secondary | ICD-10-CM

## 2011-07-19 DIAGNOSIS — E118 Type 2 diabetes mellitus with unspecified complications: Secondary | ICD-10-CM | POA: Insufficient documentation

## 2011-07-19 DIAGNOSIS — R7309 Other abnormal glucose: Secondary | ICD-10-CM

## 2011-07-19 DIAGNOSIS — J449 Chronic obstructive pulmonary disease, unspecified: Secondary | ICD-10-CM

## 2011-07-19 DIAGNOSIS — R222 Localized swelling, mass and lump, trunk: Secondary | ICD-10-CM

## 2011-07-19 DIAGNOSIS — R739 Hyperglycemia, unspecified: Secondary | ICD-10-CM

## 2011-07-19 DIAGNOSIS — I10 Essential (primary) hypertension: Secondary | ICD-10-CM

## 2011-07-19 DIAGNOSIS — B353 Tinea pedis: Secondary | ICD-10-CM | POA: Insufficient documentation

## 2011-07-19 DIAGNOSIS — Z23 Encounter for immunization: Secondary | ICD-10-CM

## 2011-07-19 LAB — LIPID PANEL
Cholesterol: 160 mg/dL (ref 0–200)
HDL: 42.7 mg/dL (ref >39.00)
LDL Cholesterol: 109 mg/dL — ABNORMAL HIGH (ref 0–99)
Triglycerides: 43 mg/dL (ref 0.0–149.0)
VLDL: 8.6 mg/dL (ref 0.0–40.0)

## 2011-07-19 LAB — COMPREHENSIVE METABOLIC PANEL
AST: 19 U/L (ref 0–37)
Albumin: 3.7 g/dL (ref 3.5–5.2)
Alkaline Phosphatase: 56 U/L (ref 39–117)
BUN: 15 mg/dL (ref 6–23)
Potassium: 4.4 mEq/L (ref 3.5–5.1)
Sodium: 139 mEq/L (ref 135–145)
Total Bilirubin: 0.5 mg/dL (ref 0.3–1.2)
Total Protein: 6.9 g/dL (ref 6.0–8.3)

## 2011-07-19 LAB — CBC WITH DIFFERENTIAL/PLATELET
Basophils Absolute: 0 10*3/uL (ref 0.0–0.1)
Eosinophils Absolute: 0.3 10*3/uL (ref 0.0–0.7)
Lymphocytes Relative: 18.2 % (ref 12.0–46.0)
MCHC: 32.7 g/dL (ref 30.0–36.0)
Monocytes Absolute: 0.7 10*3/uL (ref 0.1–1.0)
Neutrophils Relative %: 71.1 % (ref 43.0–77.0)
RDW: 15.9 % — ABNORMAL HIGH (ref 11.5–14.6)

## 2011-07-19 MED ORDER — CICLOPIROX 0.77 % EX GEL: 1.0000 | Freq: Two times a day (BID) | CUTANEOUS | Status: AC

## 2011-07-19 MED ORDER — ROFLUMILAST 500 MCG PO TABS: 500.0000 ug | ORAL_TABLET | Freq: Every day | ORAL | Status: DC

## 2011-07-19 NOTE — Progress Notes (Signed)
Subjective:    Patient ID: Jesse Garrison, male    DOB: August 27, 1945, 65 y.o.   MRN: [id number]  Hypertension This is a chronic problem. The current episode started more than 1 year ago. The problem has been gradually improving since onset. The problem is controlled. Pertinent negatives include no anxiety, blurred vision, chest pain, headaches, malaise/fatigue, neck pain, orthopnea, palpitations, peripheral edema, PND, shortness of breath or sweats. There are no associated agents to hypertension. Past treatments include alpha 1 blockers, angiotensin blockers and diuretics. The current treatment provides significant improvement. Compliance problems include exercise and diet.       Review of Systems  Constitutional: Negative for fever, chills, malaise/fatigue, diaphoresis, activity change, appetite change, fatigue and unexpected weight change.  HENT: Negative for sore throat, facial swelling, trouble swallowing, neck pain and voice change.   Eyes: Negative.  Negative for blurred vision.  Respiratory: Negative for apnea, cough, choking, chest tightness, shortness of breath, wheezing and stridor.   Cardiovascular: Negative for chest pain, palpitations, orthopnea, leg swelling and PND.  Gastrointestinal: Negative for nausea, vomiting, abdominal pain, diarrhea and constipation.  Genitourinary: Negative for dysuria, urgency, frequency, hematuria, flank pain, decreased urine volume, enuresis and difficulty urinating.  Musculoskeletal: Negative for myalgias, back pain, joint swelling, arthralgias and gait problem.  Skin: Positive for rash (scaling and itching on feet). Negative for color change, pallor and wound.  Neurological: Negative for dizziness, tremors, seizures, syncope, facial asymmetry, speech difficulty, weakness, light-headedness, numbness and headaches.  Hematological: Negative for adenopathy. Does not bruise/bleed easily.  Psychiatric/Behavioral: Negative.        Objective:   Physical  Exam  Vitals reviewed. Constitutional: He is oriented to person, place, and time. He appears well-developed and well-nourished. No distress.  HENT:  Head: Normocephalic and atraumatic.  Mouth/Throat: Oropharynx is clear and moist. No oropharyngeal exudate.  Eyes: Conjunctivae are normal. Right eye exhibits no discharge. Left eye exhibits no discharge. No scleral icterus.  Neck: Normal range of motion. Neck supple. No JVD present. No tracheal deviation present. No thyromegaly present.  Cardiovascular: Normal rate, regular rhythm, normal heart sounds and intact distal pulses.  Exam reveals no gallop and no friction rub.   No murmur heard. Pulmonary/Chest: Effort normal and breath sounds normal. No stridor. No respiratory distress. He has no wheezes. He has no rales. He exhibits no tenderness.  Abdominal: Soft. Bowel sounds are normal. He exhibits no distension and no mass. There is no tenderness. There is no rebound and no guarding.  Musculoskeletal: Normal range of motion. He exhibits no edema and no tenderness.  Lymphadenopathy:    He has no cervical adenopathy.  Neurological: He is oriented to person, place, and time.  Skin: Skin is warm and dry. Rash noted. He is not diaphoretic. No erythema. No pallor.       On the plantar side and in the web spaces of both feet there is scale and slight erythema but no fissuring, vesicles, or pustules  Psychiatric: His behavior is normal. Judgment and thought content normal.      Lab Results  Component Value Date   WBC 10.1 04/12/2011   HGB 13.9 04/12/2011   HCT 42.6 04/12/2011   PLT 171.0 04/12/2011   GLUCOSE 149* 04/12/2011   CHOL 173 07/27/2010   TRIG 66.0 07/27/2010   HDL 34.80* 07/27/2010   LDLCALC 125* 07/27/2010   ALT 16 04/12/2011   AST 19 04/12/2011   NA 140 04/12/2011   K 3.9 04/12/2011   CL 106 04/12/2011  CREATININE 1.4 04/12/2011   BUN 20 04/12/2011   CO2 26 04/12/2011   TSH 0.58 04/12/2011   PSA 2.97 07/27/2010      Assessment &  Plan:

## 2011-07-19 NOTE — Patient Instructions (Signed)

## 2011-07-19 NOTE — Assessment & Plan Note (Signed)
I will check his lipids again to see if they have improved

## 2011-07-19 NOTE — Assessment & Plan Note (Signed)
I will check his blood sugar again and will get an a1c done on him

## 2011-07-19 NOTE — Assessment & Plan Note (Signed)
His cough and SOB have improved since he quit smoking one month ago but he has not been compliant with daliresp so I have asked him to restart the daliresp to prevent any COPD flares this winter

## 2011-07-19 NOTE — Assessment & Plan Note (Signed)
Start loprox 

## 2011-07-19 NOTE — Progress Notes (Signed)
Addended by: Etta Grandchild on: 07/19/2011 12:44 PM   Modules accepted: Orders

## 2011-07-19 NOTE — Assessment & Plan Note (Signed)
His BP is well controlled, I will check his lytes and renal function today 

## 2011-07-19 NOTE — Assessment & Plan Note (Addendum)
He needs a f/up CXR.  Late note: this shows slight progression and it has been nearly one year since his last CT so I have asked him to see his CT surgeon again

## 2011-07-23 ENCOUNTER — Other Ambulatory Visit: Payer: Self-pay | Admitting: Internal Medicine

## 2011-07-23 DIAGNOSIS — R222 Localized swelling, mass and lump, trunk: Secondary | ICD-10-CM

## 2011-07-25 ENCOUNTER — Inpatient Hospital Stay: Admission: RE | Admit: 2011-07-25 | Payer: Medicare PPO | Source: Ambulatory Visit

## 2011-07-31 ENCOUNTER — Inpatient Hospital Stay: Admission: RE | Admit: 2011-07-31 | Payer: Medicare PPO | Source: Ambulatory Visit

## 2011-08-02 ENCOUNTER — Ambulatory Visit (INDEPENDENT_AMBULATORY_CARE_PROVIDER_SITE_OTHER)
Admission: RE | Admit: 2011-08-02 | Discharge: 2011-08-02 | Disposition: A | Discharge status: Home / Self Care | Payer: Medicare PPO | Source: Ambulatory Visit | Attending: Internal Medicine | Admitting: Internal Medicine

## 2011-08-02 DIAGNOSIS — R222 Localized swelling, mass and lump, trunk: Secondary | ICD-10-CM

## 2011-08-02 MED ORDER — IOHEXOL 300 MG/ML  SOLN
80.0000 mL | Freq: Once | INTRAMUSCULAR | Status: AC | PRN
  Administered 2011-08-02: 80 mL via INTRAVENOUS

## 2011-08-10 ENCOUNTER — Ambulatory Visit (INDEPENDENT_AMBULATORY_CARE_PROVIDER_SITE_OTHER): Payer: Medicare PPO | Admitting: Thoracic Surgery

## 2011-08-10 ENCOUNTER — Encounter: Payer: Self-pay | Admitting: Thoracic Surgery

## 2011-08-10 VITALS — BP 153/77 | HR 86 | Resp 20 | Ht 69.0 in | Wt 260.0 lb

## 2011-08-10 DIAGNOSIS — R222 Localized swelling, mass and lump, trunk: Secondary | ICD-10-CM

## 2011-08-10 NOTE — Progress Notes (Signed)
HPI patient returns today for followup of a right upper lobe nodule P. CT scan shows that the nodule is unchanged it measures 1.7 x 1.4. She's he continues to have severe emphysema. This since the PET scan was negative we will continue to follow this with a CT scan in 6 months. He says it is use an electronic cigarettes now   Current Outpatient Prescriptions  Medication Sig Dispense Refill  . albuterol (VENTOLIN HFA) 108 (90 BASE) MCG/ACT inhaler Inhale 2 puffs into the lungs 4 (four) times daily.  1 Inhaler  11  . Ciclopirox 0.77 % gel Apply 1 Act topically 2 (two) times daily.  100 g  2  . Fluticasone-Salmeterol (ADVAIR DISKUS) 250-50 MCG/DOSE AEPB Inhale 1 puff into the lungs 2 (two) times daily.        . roflumilast (DALIRESP) 500 MCG TABS tablet Take 1 tablet (500 mcg total) by mouth daily.  30 tablet  11  . terazosin (HYTRIN) 5 MG capsule Take 5 mg by mouth at bedtime.        . valsartan-hydrochlorothiazide (DIOVAN HCT) 320-12.5 MG per tablet Take 1 tablet by mouth daily.  90 tablet  3     Review of Systems: Unchanged Physical Exam lungs have distant breath sounds but are clear   Diagnostic Tests: CT scan shows the right upper lobe nodule is unchanged  Impression: Probable benign right upper lobe nodule   Plan: Return in 6 months with CT

## 2011-10-25 ENCOUNTER — Other Ambulatory Visit (INDEPENDENT_AMBULATORY_CARE_PROVIDER_SITE_OTHER): Payer: Medicare PPO

## 2011-10-25 ENCOUNTER — Ambulatory Visit (INDEPENDENT_AMBULATORY_CARE_PROVIDER_SITE_OTHER): Payer: Medicare PPO | Admitting: Internal Medicine

## 2011-10-25 ENCOUNTER — Ambulatory Visit (INDEPENDENT_AMBULATORY_CARE_PROVIDER_SITE_OTHER)
Admission: RE | Admit: 2011-10-25 | Discharge: 2011-10-25 | Disposition: A | Discharge status: Home / Self Care | Payer: Medicare PPO | Source: Ambulatory Visit | Attending: Internal Medicine | Admitting: Internal Medicine

## 2011-10-25 ENCOUNTER — Encounter: Payer: Self-pay | Admitting: Internal Medicine

## 2011-10-25 VITALS — BP 128/82 | HR 83 | Temp 97.8°F | Resp 16 | Wt 250.0 lb

## 2011-10-25 DIAGNOSIS — N400 Enlarged prostate without lower urinary tract symptoms: Secondary | ICD-10-CM

## 2011-10-25 DIAGNOSIS — E785 Hyperlipidemia, unspecified: Secondary | ICD-10-CM

## 2011-10-25 DIAGNOSIS — L259 Unspecified contact dermatitis, unspecified cause: Secondary | ICD-10-CM

## 2011-10-25 DIAGNOSIS — R222 Localized swelling, mass and lump, trunk: Secondary | ICD-10-CM

## 2011-10-25 DIAGNOSIS — M7989 Other specified soft tissue disorders: Secondary | ICD-10-CM

## 2011-10-25 DIAGNOSIS — B353 Tinea pedis: Secondary | ICD-10-CM

## 2011-10-25 DIAGNOSIS — I10 Essential (primary) hypertension: Secondary | ICD-10-CM

## 2011-10-25 DIAGNOSIS — L309 Dermatitis, unspecified: Secondary | ICD-10-CM

## 2011-10-25 DIAGNOSIS — IMO0001 Reserved for inherently not codable concepts without codable children: Secondary | ICD-10-CM

## 2011-10-25 LAB — CBC WITH DIFFERENTIAL/PLATELET
Basophils Absolute: 0 10*3/uL (ref 0.0–0.1)
Basophils Relative: 0.4 % (ref 0.0–3.0)
Eosinophils Absolute: 0.2 10*3/uL (ref 0.0–0.7)
Lymphocytes Relative: 17.4 % (ref 12.0–46.0)
MCHC: 31.6 g/dL (ref 30.0–36.0)
Monocytes Absolute: 0.6 10*3/uL (ref 0.1–1.0)
Neutrophils Relative %: 73.2 % (ref 43.0–77.0)
Platelets: 163 10*3/uL (ref 150.0–400.0)
RBC: 5.16 Mil/uL (ref 4.22–5.81)

## 2011-10-25 LAB — URINALYSIS, ROUTINE W REFLEX MICROSCOPIC
Bilirubin Urine: NEGATIVE
Hgb urine dipstick: NEGATIVE
Total Protein, Urine: NEGATIVE
Urine Glucose: NEGATIVE
pH: 5.5 (ref 5.0–8.0)

## 2011-10-25 LAB — HM DIABETES FOOT EXAM

## 2011-10-25 MED ORDER — CETIRIZINE HCL 10 MG PO TABS: 10.0000 mg | ORAL_TABLET | Freq: Every day | ORAL | Status: AC

## 2011-10-25 MED ORDER — TRIAMCINOLONE ACETONIDE 0.5 % EX CREA: TOPICAL_CREAM | Freq: Three times a day (TID) | CUTANEOUS | Status: DC

## 2011-10-25 MED ORDER — KETOCONAZOLE 2 % EX CREA: TOPICAL_CREAM | Freq: Two times a day (BID) | CUTANEOUS | Status: DC

## 2011-10-25 NOTE — Assessment & Plan Note (Signed)
PSA today

## 2011-10-25 NOTE — Assessment & Plan Note (Signed)
Recheck his CXR today

## 2011-10-25 NOTE — Assessment & Plan Note (Signed)
Start tac cream and zyrtec

## 2011-10-25 NOTE — Progress Notes (Signed)
Subjective:    Patient ID: Jesse Garrison, male    DOB: 23-Dec-1945, 66 y.o.   MRN: [id number]  Diabetes He presents for his follow-up diabetic visit. He has type 2 diabetes mellitus. His disease course has been stable. There are no hypoglycemic associated symptoms. Pertinent negatives for hypoglycemia include no dizziness, headaches, pallor, seizures, speech difficulty or tremors. Pertinent negatives for diabetes include no blurred vision, no chest pain, no fatigue, no foot paresthesias, no foot ulcerations, no polydipsia, no polyphagia, no polyuria, no visual change, no weakness and no weight loss. There are no hypoglycemic complications. Symptoms are stable. There are no diabetic complications. Current diabetic treatment includes diet. He is compliant with treatment all of the time. His weight is stable. He is following a generally healthy diet. Meal planning includes avoidance of concentrated sweets. He has not had a previous visit with a dietician. He never participates in exercise. There is no change in his home blood glucose trend. An ACE inhibitor/angiotensin II receptor blocker is being taken. He does not see a podiatrist.Eye exam is not current.  Hypertension Pertinent negatives include no blurred vision, chest pain, headaches or shortness of breath.  Hyperlipidemia Pertinent negatives include no chest pain, myalgias or shortness of breath.      Review of Systems  Constitutional: Negative for fever, chills, weight loss, diaphoresis, activity change, appetite change, fatigue and unexpected weight change.  HENT: Negative.   Eyes: Negative.  Negative for blurred vision.  Respiratory: Positive for cough (chronic, unchanged, NP cough). Negative for apnea, choking, chest tightness, shortness of breath, wheezing and stridor.   Cardiovascular: Negative for chest pain.  Gastrointestinal: Negative for nausea, vomiting, abdominal pain, diarrhea, constipation, blood in stool, abdominal distention and  anal bleeding.  Genitourinary: Negative for dysuria, urgency, polyuria, frequency, hematuria, flank pain, decreased urine volume, enuresis, difficulty urinating and genital sores.  Musculoskeletal: Positive for joint swelling (he feels like both arms are swelling over the lower biceps area). Negative for myalgias, back pain, arthralgias and gait problem.  Skin: Positive for rash (and itching over both biceps, rash and scaling on both feet). Negative for color change, pallor and wound.  Neurological: Negative for dizziness, tremors, seizures, syncope, facial asymmetry, speech difficulty, weakness, light-headedness, numbness and headaches.  Hematological: Negative for polydipsia, polyphagia and adenopathy. Does not bruise/bleed easily.  Psychiatric/Behavioral: Negative.        Objective:   Physical Exam  Vitals reviewed. Constitutional: He appears well-developed and well-nourished. No distress.  HENT:  Head: Normocephalic and atraumatic.  Mouth/Throat: Oropharynx is clear and moist. No oropharyngeal exudate.  Eyes: Conjunctivae are normal. Right eye exhibits no discharge. Left eye exhibits no discharge. No scleral icterus.  Neck: Normal range of motion. Neck supple. No JVD present. No tracheal deviation present. No thyromegaly present.  Cardiovascular: Normal rate, regular rhythm, normal heart sounds and intact distal pulses.  Exam reveals no gallop, no friction rub and no decreased pulses.   No murmur heard. Pulses:      Carotid pulses are 2+ on the right side, and 2+ on the left side.      Radial pulses are 2+ on the right side, and 2+ on the left side.       Femoral pulses are 2+ on the right side, and 2+ on the left side.      Popliteal pulses are 2+ on the right side, and 2+ on the left side.       Dorsalis pedis pulses are 2+ on the right side, and 2+  on the left side.       Posterior tibial pulses are 2+ on the right side, and 2+ on the left side.  Pulmonary/Chest: Effort normal and  breath sounds normal. No stridor. No respiratory distress. He has no wheezes. He has no rales. He exhibits no tenderness.  Abdominal: Soft. Bowel sounds are normal. He exhibits no distension and no mass. There is no tenderness. There is no rebound and no guarding.  Musculoskeletal: Normal range of motion. He exhibits no edema and no tenderness.       Right upper arm: Normal. He exhibits no tenderness, no bony tenderness, no swelling, no edema and no deformity.       Left upper arm: Normal. He exhibits no tenderness, no bony tenderness, no swelling, no edema and no deformity.  Lymphadenopathy:    He has no cervical adenopathy.  Skin: Skin is warm and dry. Abrasion (excoriated areas with dermagraphism) and rash noted. No bruising, no burn, no ecchymosis, no laceration, no lesion, no petechiae and no purpura noted. Rash is not macular, not papular, not maculopapular, not nodular, not pustular, not vesicular and not urticarial. He is not diaphoretic. No cyanosis or erythema. No pallor. Nails show no clubbing.          He has dermagraphism , xersosis, and scaling over both upper arms  Psychiatric: He has a normal mood and affect. His behavior is normal. Judgment and thought content normal.          Assessment & Plan:

## 2011-10-25 NOTE — Assessment & Plan Note (Signed)
Try ketoconazole cream 

## 2011-10-25 NOTE — Assessment & Plan Note (Signed)
With his history of lung mass I will check an u/s of both arms to look for SVC syndrome, dvt, compression of the blood flow

## 2011-10-25 NOTE — Assessment & Plan Note (Signed)
FLP today 

## 2011-10-25 NOTE — Assessment & Plan Note (Signed)
Check his a1c today 

## 2011-10-25 NOTE — Patient Instructions (Signed)
Eczema Atopic dermatitis, or eczema, is an inherited type of sensitive skin. Often people with eczema have a family history of allergies, asthma, or hay fever. It causes a red itchy rash and dry scaly skin. The itchiness may occur before the skin rash and may be very intense. It is not contagious. Eczema is generally worse during the cooler winter months and often improves with the warmth of summer. Eczema usually starts showing signs in infancy. Some children outgrow eczema, but it may last through adulthood. Flare-ups may be caused by:  Eating something or contact with something you are sensitive or allergic to.   Stress.  DIAGNOSIS  The diagnosis of eczema is usually based upon symptoms and medical history. TREATMENT  Eczema cannot be cured, but symptoms usually can be controlled with treatment or avoidance of allergens (things to which you are sensitive or allergic to).  Controlling the itching and scratching.   Use over-the-counter antihistamines as directed for itching. It is especially useful at night when the itching tends to be worse.   Use over-the-counter steroid creams as directed for itching.   Scratching makes the rash and itching worse and may cause impetigo (a skin infection) if fingernails are contaminated (dirty).   Keeping the skin well moisturized with creams every day. This will seal in moisture and help prevent dryness. Lotions containing alcohol and water can dry the skin and are not recommended.   Limiting exposure to allergens.   Recognizing situations that cause stress.   Developing a plan to manage stress.  HOME CARE INSTRUCTIONS   Take prescription and over-the-counter medicines as directed by your caregiver.   Do not use anything on the skin without checking with your caregiver.   Keep baths or showers short (5 minutes) in warm (not hot) water. Use mild cleansers for bathing. You may add non-perfumed bath oil to the bath water. It is best to avoid soap and  bubble bath.   Immediately after a bath or shower, when the skin is still damp, apply a moisturizing ointment to the entire body. This ointment should be a petroleum ointment. This will seal in moisture and help prevent dryness. The thicker the ointment the better. These should be unscented.   Keep fingernails cut short and wash hands often. If your child has eczema, it may be necessary to put soft gloves or mittens on your child at night.   Dress in clothes made of cotton or cotton blends. Dress lightly, as heat increases itching.   Avoid foods that may cause flare-ups. Common foods include cow's milk, peanut butter, eggs and wheat.   Keep a child with eczema away from anyone with fever blisters. The virus that causes fever blisters (herpes simplex) can cause a serious skin infection in children with eczema.  SEEK MEDICAL CARE IF:   Itching interferes with sleep.   The rash gets worse or is not better within one week following treatment.   The rash looks infected (pus or soft yellow scabs).   You or your child has an oral temperature above 102 F (38.9 C).   Your baby is older than 3 months with a rectal temperature of 100.5 F (38.1 C) or higher for more than 1 day.   The rash flares up after contact with someone who has fever blisters.  SEEK IMMEDIATE MEDICAL CARE IF:   Your baby is older than 3 months with a rectal temperature of 102 F (38.9 C) or higher.   Your baby is older than  3 months or younger with a rectal temperature of 100.4 F (38 C) or higher.  Document Released: 07/27/2000 Document Revised: 07/19/2011 Document Reviewed: 06/01/2009 [facility] Patient Information 2012 [place], [place].Diabetes, Type 2 Diabetes is a long-lasting (chronic) disease. In type 2 diabetes, the pancreas does not make enough insulin (a hormone), and the body does not respond normally to the insulin that is made. This type of diabetes was also previously called adult-onset diabetes. It usually  occurs after the age of 69, but it can occur at any age.  CAUSES  Type 2 diabetes happens because the pancreasis not making enough insulin or your body has trouble using the insulin that your pancreas does make properly. SYMPTOMS   Drinking more than usual.   Urinating more than usual.   Blurred vision.   Dry, itchy skin.   Frequent infections.   Feeling more tired than usual (fatigue).  DIAGNOSIS The diagnosis of type 2 diabetes is usually made by one of the following tests:  Fasting blood glucose test. You will not eat for at least 8 hours and then take a blood test.   Random blood glucose test. Your blood glucose (sugar) is checked at any time of the day regardless of when you ate.   Oral glucose tolerance test (OGTT). Your blood glucose is measured after you have not eaten (fasted) and then after you drink a glucose containing beverage.  TREATMENT   Healthy eating.   Exercise.   Medicine, if needed.   Monitoring blood glucose.   Seeing your caregiver regularly.  HOME CARE INSTRUCTIONS   Check your blood glucose at least once a day. More frequent monitoring may be necessary, depending on your medicines and on how well your diabetes is controlled. Your caregiver will advise you.   Take your medicine as directed by your caregiver.   Do not smoke.   Make wise food choices. Ask your caregiver for information. Weight loss can improve your diabetes.   Learn about low blood glucose (hypoglycemia) and how to treat it.   Get your eyes checked regularly.   Have a yearly physical exam. Have your blood pressure checked and your blood and urine tested.   Wear a pendant or bracelet saying that you have diabetes.   Check your feet every night for cuts, sores, blisters, and redness. Let your caregiver know if you have any problems.  SEEK MEDICAL CARE IF:   You have problems keeping your blood glucose in target range.   You have problems with your medicines.   You have  symptoms of an illness that do not improve after 24 hours.   You have a sore or wound that is not healing.   You notice a change in vision or a new problem with your vision.   You have a fever.  MAKE SURE YOU:  Understand these instructions.   Will watch your condition.   Will get help right away if you are not doing well or get worse.  Document Released: 07/30/2005 Document Revised: 07/19/2011 Document Reviewed: 01/15/2011 [facility] Patient Information 2012 [place], [place].

## 2011-10-26 ENCOUNTER — Encounter: Payer: Self-pay | Admitting: Internal Medicine

## 2011-10-26 LAB — COMPREHENSIVE METABOLIC PANEL
ALT: 17 U/L (ref 0–53)
AST: 20 U/L (ref 0–37)
Albumin: 3.9 g/dL (ref 3.5–5.2)
Alkaline Phosphatase: 55 U/L (ref 39–117)
BUN: 16 mg/dL (ref 6–23)
Calcium: 9.7 mg/dL (ref 8.4–10.5)
Chloride: 109 mEq/L (ref 96–112)
Potassium: 4.3 mEq/L (ref 3.5–5.1)
Sodium: 141 mEq/L (ref 135–145)
Total Protein: 6.8 g/dL (ref 6.0–8.3)

## 2011-10-26 LAB — LIPID PANEL
Cholesterol: 163 mg/dL (ref 0–200)
LDL Cholesterol: 110 mg/dL — ABNORMAL HIGH (ref 0–99)
Triglycerides: 61 mg/dL (ref 0.0–149.0)
VLDL: 12.2 mg/dL (ref 0.0–40.0)

## 2011-10-26 LAB — TSH: TSH: 0.83 u[IU]/mL (ref 0.35–5.50)

## 2011-11-02 ENCOUNTER — Encounter (INDEPENDENT_AMBULATORY_CARE_PROVIDER_SITE_OTHER): Payer: Medicare PPO

## 2011-11-02 DIAGNOSIS — M7989 Other specified soft tissue disorders: Secondary | ICD-10-CM

## 2011-12-18 ENCOUNTER — Ambulatory Visit (INDEPENDENT_AMBULATORY_CARE_PROVIDER_SITE_OTHER): Payer: Medicare PPO | Admitting: Internal Medicine

## 2011-12-18 ENCOUNTER — Encounter: Payer: Self-pay | Admitting: Internal Medicine

## 2011-12-18 VITALS — BP 138/64 | HR 86 | Temp 97.8°F | Resp 16

## 2011-12-18 DIAGNOSIS — R0683 Snoring: Secondary | ICD-10-CM

## 2011-12-18 DIAGNOSIS — J4489 Other specified chronic obstructive pulmonary disease: Secondary | ICD-10-CM

## 2011-12-18 DIAGNOSIS — R0609 Other forms of dyspnea: Secondary | ICD-10-CM

## 2011-12-18 DIAGNOSIS — J449 Chronic obstructive pulmonary disease, unspecified: Secondary | ICD-10-CM

## 2011-12-18 DIAGNOSIS — I10 Essential (primary) hypertension: Secondary | ICD-10-CM

## 2011-12-18 DIAGNOSIS — H269 Unspecified cataract: Secondary | ICD-10-CM

## 2011-12-18 DIAGNOSIS — IMO0001 Reserved for inherently not codable concepts without codable children: Secondary | ICD-10-CM

## 2011-12-18 NOTE — Assessment & Plan Note (Signed)
He needs to be evaluated by pulm

## 2011-12-18 NOTE — Assessment & Plan Note (Signed)
His recent a1c looks good 

## 2011-12-18 NOTE — Assessment & Plan Note (Signed)
His ophth can address this

## 2011-12-18 NOTE — Assessment & Plan Note (Signed)
He was referred for a sleep evaluation 

## 2011-12-18 NOTE — Patient Instructions (Signed)
Diabetes, Type 2 Diabetes is a long-lasting (chronic) disease. In type 2 diabetes, the pancreas does not make enough insulin (a hormone), and the body does not respond normally to the insulin that is made. This type of diabetes was also previously called adult-onset diabetes. It usually occurs after the age of 27, but it can occur at any age.  CAUSES  Type 2 diabetes happens because the pancreasis not making enough insulin or your body has trouble using the insulin that your pancreas does make properly. SYMPTOMS   Drinking more than usual.   Urinating more than usual.   Blurred vision.   Dry, itchy skin.   Frequent infections.   Feeling more tired than usual (fatigue).  DIAGNOSIS The diagnosis of type 2 diabetes is usually made by one of the following tests:  Fasting blood glucose test. You will not eat for at least 8 hours and then take a blood test.   Random blood glucose test. Your blood glucose (sugar) is checked at any time of the day regardless of when you ate.   Oral glucose tolerance test (OGTT). Your blood glucose is measured after you have not eaten (fasted) and then after you drink a glucose containing beverage.  TREATMENT   Healthy eating.   Exercise.   Medicine, if needed.   Monitoring blood glucose.   Seeing your caregiver regularly.  HOME CARE INSTRUCTIONS   Check your blood glucose at least once a day. More frequent monitoring may be necessary, depending on your medicines and on how well your diabetes is controlled. Your caregiver will advise you.   Take your medicine as directed by your caregiver.   Do not smoke.   Make wise food choices. Ask your caregiver for information. Weight loss can improve your diabetes.   Learn about low blood glucose (hypoglycemia) and how to treat it.   Get your eyes checked regularly.   Have a yearly physical exam. Have your blood pressure checked and your blood and urine tested.   Wear a pendant or bracelet saying  that you have diabetes.   Check your feet every night for cuts, sores, blisters, and redness. Let your caregiver know if you have any problems.  SEEK MEDICAL CARE IF:   You have problems keeping your blood glucose in target range.   You have problems with your medicines.   You have symptoms of an illness that do not improve after 24 hours.   You have a sore or wound that is not healing.   You notice a change in vision or a new problem with your vision.   You have a fever.  MAKE SURE YOU:  Understand these instructions.   Will watch your condition.   Will get help right away if you are not doing well or get worse.  Document Released: 07/30/2005 Document Revised: 07/19/2011 Document Reviewed: 01/15/2011 [facility] Patient Information 2012 [place], [place].Chronic Obstructive Pulmonary Disease Chronic obstructive pulmonary disease (COPD) is a condition in which airflow from the lungs is restricted. The lungs can never return to normal, but there are measures you can take which will improve them and make you feel better. CAUSES   Smoking.   Exposure to secondhand smoke.   Breathing in irritants (pollution, cigarette smoke, strong smells, aerosol sprays, paint fumes).   History of lung infections.  TREATMENT  Treatment focuses on making you comfortable (supportive care). Your caregiver may prescribe medications (inhaled or pills) to help improve your breathing. HOME CARE INSTRUCTIONS   If you smoke,  stop smoking.   Avoid exposure to smoke, chemicals, and fumes that aggravate your breathing.   Take antibiotic medicines as directed by your caregiver.   Avoid medicines that dry up your system and slow down the elimination of secretions (antihistamines and cough syrups). This decreases respiratory capacity and may lead to infections.   Drink enough water and fluids to keep your urine clear or pale yellow. This loosens secretions.   Use humidifiers at home and at your bedside if  they do not make breathing difficult.   Receive all protective vaccines your caregiver suggests, especially pneumococcal and influenza.   Use home oxygen as suggested.   Stay active. Exercise and physical activity will help maintain your ability to do things you want to do.   Eat a healthy diet.  SEEK MEDICAL CARE IF:   You develop pus-like mucus (sputum).   Breathing is more labored or exercise becomes difficult to do.   You are running out of the medicine you take for your breathing.  SEEK IMMEDIATE MEDICAL CARE IF:   You have a rapid heart rate.   You have agitation, confusion, tremors, or are in a stupor (family members may need to observe this).   It becomes difficult to breathe.   You develop chest pain.   You have a fever.  MAKE SURE YOU:   Understand these instructions.   Will watch your condition.   Will get help right away if you are not doing well or get worse.  Document Released: 05/09/2005 Document Revised: 07/19/2011 Document Reviewed: 09/29/2010 [facility] Patient Information 2012 [place], [place].

## 2011-12-18 NOTE — Progress Notes (Signed)
  Subjective:    Patient ID: Jesse Garrison, male    DOB: 03-29-1946, 66 y.o.   MRN: [id number]  HPI He recently had a DOT physical and comes in today with a form to be the completed. The examiner thinks the pt has a cataract and wants that addressed. He also thinks the pt is high risk for OSA and wants that to be evaluated as well as improving control of his COPD, specifically he has asked that the pt consider starting spiriva.   Review of Systems  Constitutional: Negative for fever, chills, diaphoresis, activity change, appetite change, fatigue and unexpected weight change.  HENT: Negative.   Eyes: Negative.   Respiratory: Positive for apnea (and snoring) and shortness of breath. Negative for cough, choking, chest tightness, wheezing and stridor.   Cardiovascular: Negative for chest pain, palpitations and leg swelling.  Gastrointestinal: Negative for nausea, vomiting, abdominal pain, diarrhea, constipation and anal bleeding.  Genitourinary: Negative.   Musculoskeletal: Negative for myalgias, back pain, joint swelling, arthralgias and gait problem.  Skin: Negative for color change, pallor, rash and wound.  Neurological: Negative for dizziness, tremors, seizures, syncope, facial asymmetry, speech difficulty, weakness, light-headedness, numbness and headaches.  Hematological: Negative for adenopathy. Does not bruise/bleed easily.  Psychiatric/Behavioral: Negative.        Objective:   Physical Exam  Vitals reviewed. Constitutional: He is oriented to person, place, and time. He appears well-developed and well-nourished. No distress.  HENT:  Head: Normocephalic and atraumatic.  Mouth/Throat: Oropharynx is clear and moist. No oropharyngeal exudate.  Eyes: Conjunctivae are normal. Right eye exhibits no discharge. Left eye exhibits no discharge. No scleral icterus.  Neck: Normal range of motion. Neck supple. No JVD present. No tracheal deviation present. No thyromegaly present.  Cardiovascular:  Normal rate, regular rhythm, normal heart sounds and intact distal pulses.  Exam reveals no gallop and no friction rub.   No murmur heard. Pulmonary/Chest: Effort normal. No accessory muscle usage or stridor. Not tachypneic. No respiratory distress. He has no decreased breath sounds. He has wheezes in the right middle field and the left middle field. He has rhonchi in the right middle field and the left middle field. He has no rales.  Abdominal: Soft. Bowel sounds are normal. He exhibits no distension and no mass. There is no tenderness. There is no rebound and no guarding.  Musculoskeletal: Normal range of motion. He exhibits no edema and no tenderness.  Lymphadenopathy:    He has no cervical adenopathy.  Neurological: He is oriented to person, place, and time.  Skin: Skin is warm and dry. No rash noted. He is not diaphoretic. No erythema. No pallor.  Psychiatric: He has a normal mood and affect. His behavior is normal. Judgment and thought content normal.      Lab Results  Component Value Date   WBC 9.6 10/25/2011   HGB 13.4 10/25/2011   HCT 42.2 10/25/2011   PLT 163.0 10/25/2011   GLUCOSE 90 10/25/2011   CHOL 163 10/25/2011   TRIG 61.0 10/25/2011   HDL 41.10 10/25/2011   LDLCALC 110* 10/25/2011   ALT 17 10/25/2011   AST 20 10/25/2011   NA 141 10/25/2011   K 4.3 10/25/2011   CL 109 10/25/2011   CREATININE 1.3 10/25/2011   BUN 16 10/25/2011   CO2 25 10/25/2011   TSH 0.83 10/25/2011   PSA 3.22 10/25/2011   HGBA1C 7.2* 10/25/2011      Assessment & Plan:

## 2011-12-18 NOTE — Assessment & Plan Note (Signed)
His BP is well controlled 

## 2011-12-19 ENCOUNTER — Other Ambulatory Visit: Payer: Self-pay | Admitting: Thoracic Surgery

## 2011-12-19 DIAGNOSIS — R222 Localized swelling, mass and lump, trunk: Secondary | ICD-10-CM

## 2011-12-21 ENCOUNTER — Institutional Professional Consult (permissible substitution): Payer: Medicare PPO | Admitting: Pulmonary Disease

## 2012-01-23 ENCOUNTER — Other Ambulatory Visit: Payer: Medicare PPO

## 2012-01-23 ENCOUNTER — Ambulatory Visit: Payer: Medicare PPO | Admitting: Thoracic Surgery

## 2012-04-29 ENCOUNTER — Other Ambulatory Visit: Payer: Medicare PPO

## 2012-04-29 ENCOUNTER — Ambulatory Visit: Payer: Medicare PPO | Admitting: Thoracic Surgery (Cardiothoracic Vascular Surgery)

## 2012-06-03 ENCOUNTER — Other Ambulatory Visit: Payer: Medicare PPO

## 2012-06-03 ENCOUNTER — Ambulatory Visit: Payer: Medicare PPO | Admitting: Thoracic Surgery (Cardiothoracic Vascular Surgery)

## 2012-06-11 ENCOUNTER — Encounter: Payer: Self-pay | Admitting: Internal Medicine

## 2012-06-11 ENCOUNTER — Ambulatory Visit (INDEPENDENT_AMBULATORY_CARE_PROVIDER_SITE_OTHER): Payer: Medicare PPO | Admitting: Internal Medicine

## 2012-06-11 ENCOUNTER — Other Ambulatory Visit (INDEPENDENT_AMBULATORY_CARE_PROVIDER_SITE_OTHER): Payer: Medicare PPO

## 2012-06-11 VITALS — BP 140/78 | HR 77 | Temp 97.4°F | Resp 16 | Wt 251.5 lb

## 2012-06-11 DIAGNOSIS — E785 Hyperlipidemia, unspecified: Secondary | ICD-10-CM

## 2012-06-11 DIAGNOSIS — IMO0001 Reserved for inherently not codable concepts without codable children: Secondary | ICD-10-CM

## 2012-06-11 DIAGNOSIS — Z23 Encounter for immunization: Secondary | ICD-10-CM

## 2012-06-11 DIAGNOSIS — J449 Chronic obstructive pulmonary disease, unspecified: Secondary | ICD-10-CM

## 2012-06-11 DIAGNOSIS — N400 Enlarged prostate without lower urinary tract symptoms: Secondary | ICD-10-CM

## 2012-06-11 DIAGNOSIS — Z0000 Encounter for general adult medical examination without abnormal findings: Secondary | ICD-10-CM

## 2012-06-11 DIAGNOSIS — I10 Essential (primary) hypertension: Secondary | ICD-10-CM

## 2012-06-11 DIAGNOSIS — H269 Unspecified cataract: Secondary | ICD-10-CM

## 2012-06-11 DIAGNOSIS — R222 Localized swelling, mass and lump, trunk: Secondary | ICD-10-CM

## 2012-06-11 LAB — HEMOGLOBIN A1C: Hgb A1c MFr Bld: 7.4 % — ABNORMAL HIGH (ref 4.6–6.5)

## 2012-06-11 LAB — COMPREHENSIVE METABOLIC PANEL
ALT: 21 U/L (ref 0–53)
BUN: 18 mg/dL (ref 6–23)
CO2: 26 mEq/L (ref 19–32)
Calcium: 9.7 mg/dL (ref 8.4–10.5)
Chloride: 104 mEq/L (ref 96–112)
Creatinine, Ser: 1.5 mg/dL (ref 0.4–1.5)
GFR: 51.58 mL/min — ABNORMAL LOW (ref >60.00)

## 2012-06-11 LAB — LIPID PANEL
Total CHOL/HDL Ratio: 5
Triglycerides: 91 mg/dL (ref 0.0–149.0)

## 2012-06-11 MED ORDER — AMLODIPINE-OLMESARTAN 5-40 MG PO TABS: 1.0000 | ORAL_TABLET | Freq: Every day | ORAL | Status: DC

## 2012-06-11 NOTE — Assessment & Plan Note (Signed)
I will check his a1c today and will treat if needed 

## 2012-06-11 NOTE — Assessment & Plan Note (Signed)
CT scan is scheduled for next week

## 2012-06-11 NOTE — Assessment & Plan Note (Signed)
Continue Advair Diskus

## 2012-06-11 NOTE — Assessment & Plan Note (Signed)
His BP is not well controlled due to non-compliance, I gave him samples of Azor today

## 2012-06-11 NOTE — Progress Notes (Signed)
Subjective:    Patient ID: Jesse Garrison, male    DOB: 12-06-1945, 66 y.o.   MRN: [id number]  Diabetes He presents for his follow-up diabetic visit. He has type 2 diabetes mellitus. His disease course has been stable. There are no hypoglycemic associated symptoms. Associated symptoms include polyuria. Pertinent negatives for diabetes include no blurred vision, no chest pain, no fatigue, no foot paresthesias, no foot ulcerations, no polydipsia, no polyphagia, no visual change, no weakness and no weight loss. There are no hypoglycemic complications. Symptoms are stable. There are no diabetic complications. Current diabetic treatment includes diet. He is compliant with treatment some of the time. His weight is stable. He is following a generally healthy diet. Meal planning includes avoidance of concentrated sweets. He has not had a previous visit with a dietician. He never participates in exercise. There is no change in his home blood glucose trend. An ACE inhibitor/angiotensin II receptor blocker is not being taken. He does not see a podiatrist.Eye exam is not current.      Review of Systems  Constitutional: Negative for fever, chills, weight loss, diaphoresis, activity change, appetite change, fatigue and unexpected weight change.  HENT: Negative.   Eyes: Negative.  Negative for blurred vision.  Respiratory: Positive for wheezing. Negative for apnea, cough, choking, chest tightness, shortness of breath and stridor.   Cardiovascular: Negative for chest pain, palpitations and leg swelling.  Gastrointestinal: Negative for nausea, vomiting, abdominal pain, diarrhea, constipation and blood in stool.  Genitourinary: Positive for polyuria and frequency. Negative for dysuria, urgency, hematuria, flank pain, decreased urine volume, discharge, penile swelling, scrotal swelling, enuresis, difficulty urinating, genital sores, penile pain and testicular pain.  Musculoskeletal: Negative for myalgias, back pain,  joint swelling, arthralgias and gait problem.  Skin: Negative.   Neurological: Negative.  Negative for weakness.  Hematological: Negative for polydipsia, polyphagia and adenopathy. Does not bruise/bleed easily.  Psychiatric/Behavioral: Negative.        Objective:   Physical Exam  Vitals reviewed. Constitutional: He is oriented to person, place, and time. He appears well-developed and well-nourished.  Non-toxic appearance. He does not have a sickly appearance. He does not appear ill. No distress.  HENT:  Head: Normocephalic and atraumatic.  Mouth/Throat: Oropharynx is clear and moist. No oropharyngeal exudate.  Eyes: Conjunctivae normal are normal. Right eye exhibits no discharge. Left eye exhibits no discharge. No scleral icterus.  Neck: Normal range of motion. Neck supple. No JVD present. No tracheal deviation present. No thyromegaly present.  Cardiovascular: Normal rate, regular rhythm, normal heart sounds and intact distal pulses.  Exam reveals no gallop.   No murmur heard. Pulmonary/Chest: Effort normal. No accessory muscle usage or stridor. Not tachypneic. No respiratory distress. He has no decreased breath sounds. He has no wheezes. He has rhonchi in the right middle field and the left middle field. He has no rales. He exhibits no tenderness.  Abdominal: Soft. Bowel sounds are normal. He exhibits no distension and no mass. There is no tenderness. There is no rebound and no guarding. Hernia confirmed negative in the right inguinal area and confirmed negative in the left inguinal area.  Genitourinary: Rectum normal, testes normal and penis normal. Rectal exam shows no external hemorrhoid, no internal hemorrhoid, no fissure, no mass, no tenderness and anal tone normal. Guaiac negative stool. Prostate is enlarged (1+ smooth symm BPH). Prostate is not tender. Right testis shows no mass, no swelling and no tenderness. Right testis is descended. Left testis shows no mass, no swelling and no  tenderness. Left testis is descended. Circumcised. No penile erythema or penile tenderness. No discharge found.  Musculoskeletal: Normal range of motion. He exhibits no edema and no tenderness.  Lymphadenopathy:    He has no cervical adenopathy.       Right: No inguinal adenopathy present.       Left: No inguinal adenopathy present.  Neurological: He is oriented to person, place, and time.  Skin: Skin is warm and dry. No rash noted. He is not diaphoretic. No erythema. No pallor.  Psychiatric: He has a normal mood and affect. His behavior is normal. Judgment and thought content normal.      Lab Results  Component Value Date   WBC 9.6 10/25/2011   HGB 13.4 10/25/2011   HCT 42.2 10/25/2011   PLT 163.0 10/25/2011   GLUCOSE 90 10/25/2011   CHOL 163 10/25/2011   TRIG 61.0 10/25/2011   HDL 41.10 10/25/2011   LDLCALC 110* 10/25/2011   ALT 17 10/25/2011   AST 20 10/25/2011   NA 141 10/25/2011   K 4.3 10/25/2011   CL 109 10/25/2011   CREATININE 1.3 10/25/2011   BUN 16 10/25/2011   CO2 25 10/25/2011   TSH 0.83 10/25/2011   PSA 3.22 10/25/2011   HGBA1C 7.2* 10/25/2011      Assessment & Plan:

## 2012-06-11 NOTE — Patient Instructions (Signed)
Health Maintenance, Males A healthy lifestyle and preventative care can promote health and wellness.  Maintain regular health, dental, and eye exams.  Eat a healthy diet. Foods like vegetables, fruits, whole grains, low-fat dairy products, and lean protein foods contain the nutrients you need without too many calories. Decrease your intake of foods high in solid fats, added sugars, and salt. Get information about a proper diet from your caregiver, if necessary.  Regular physical exercise is one of the most important things you can do for your health. Most adults should get at least 150 minutes of moderate-intensity exercise (any activity that increases your heart rate and causes you to sweat) each week. In addition, most adults need muscle-strengthening exercises on 2 or more days a week.   Maintain a healthy weight. The body mass index (BMI) is a screening tool to identify possible weight problems. It provides an estimate of body fat based on height and weight. Your caregiver can help determine your BMI, and can help you achieve or maintain a healthy weight. For adults 20 years and older:  A BMI below 18.5 is considered underweight.  A BMI of 18.5 to 24.9 is normal.  A BMI of 25 to 29.9 is considered overweight.  A BMI of 30 and above is considered obese.  Maintain normal blood lipids and cholesterol by exercising and minimizing your intake of saturated fat. Eat a balanced diet with plenty of fruits and vegetables. Blood tests for lipids and cholesterol should begin at age 20 and be repeated every 5 years. If your lipid or cholesterol levels are high, you are over 50, or you are a high risk for heart disease, you may need your cholesterol levels checked more frequently.Ongoing high lipid and cholesterol levels should be treated with medicines, if diet and exercise are not effective.  If you smoke, find out from your caregiver how to quit. If you do not use tobacco, do not start.  If you  choose to drink alcohol, do not exceed 2 drinks per day. One drink is considered to be 12 ounces (355 mL) of beer, 5 ounces (148 mL) of wine, or 1.5 ounces (44 mL) of liquor.  Avoid use of street drugs. Do not share needles with anyone. Ask for help if you need support or instructions about stopping the use of drugs.  High blood pressure causes heart disease and increases the risk of stroke. Blood pressure should be checked at least every 1 to 2 years. Ongoing high blood pressure should be treated with medicines if weight loss and exercise are not effective.  If you are 45 to 67 years old, ask your caregiver if you should take aspirin to prevent heart disease.  Diabetes screening involves taking a blood sample to check your fasting blood sugar level. This should be done once every 3 years, after age 45, if you are within normal weight and without risk factors for diabetes. Testing should be considered at a younger age or be carried out more frequently if you are overweight and have at least 1 risk factor for diabetes.  Colorectal cancer can be detected and often prevented. Most routine colorectal cancer screening begins at the age of 50 and continues through age 75. However, your caregiver may recommend screening at an earlier age if you have risk factors for colon cancer. On a yearly basis, your caregiver may provide home test kits to check for hidden blood in the stool. Use of a small camera at the end of a tube,   to directly examine the colon (sigmoidoscopy or colonoscopy), can detect the earliest forms of colorectal cancer. Talk to your caregiver about this at age 50, when routine screening begins. Direct examination of the colon should be repeated every 5 to 10 years through age 75, unless early forms of pre-cancerous polyps or small growths are found.  Hepatitis C blood testing is recommended for all people born from 1945 through 1965 and any individual with known risks for hepatitis C.  Healthy  men should no longer receive prostate-specific antigen (PSA) blood tests as part of routine cancer screening. Consult with your caregiver about prostate cancer screening.  Testicular cancer screening is not recommended for adolescents or adult males who have no symptoms. Screening includes self-exam, caregiver exam, and other screening tests. Consult with your caregiver about any symptoms you have or any concerns you have about testicular cancer.  Practice safe sex. Use condoms and avoid high-risk sexual practices to reduce the spread of sexually transmitted infections (STIs).  Use sunscreen with a sun protection factor (SPF) of 30 or greater. Apply sunscreen liberally and repeatedly throughout the day. You should seek shade when your shadow is shorter than you. Protect yourself by wearing long sleeves, pants, a wide-brimmed hat, and sunglasses year round, whenever you are outdoors.  Notify your caregiver of new moles or changes in moles, especially if there is a change in shape or color. Also notify your caregiver if a mole is larger than the size of a pencil eraser.  A one-time screening for abdominal aortic aneurysm (AAA) and surgical repair of large AAAs by sound wave imaging (ultrasonography) is recommended for ages 65 to 75 years who are current or former smokers.  Stay current with your immunizations. Document Released: 01/26/2008 Document Revised: 10/22/2011 Document Reviewed: 12/25/2010 [employer] Patient Information 2013 [employer]. Diabetes, Type 2 Diabetes is a long-lasting (chronic) disease. In type 2 diabetes, the pancreas does not make enough insulin (a hormone), and the body does not respond normally to the insulin that is made. This type of diabetes was also previously called adult-onset diabetes. It usually occurs after the age of 40, but it can occur at any age.  CAUSES  Type 2 diabetes happens because the pancreasis not making enough insulin or your body has trouble using  the insulin that your pancreas does make properly. SYMPTOMS   Drinking more than usual.  Urinating more than usual.  Blurred vision.  Dry, itchy skin.  Frequent infections.  Feeling more tired than usual (fatigue). DIAGNOSIS The diagnosis of type 2 diabetes is usually made by one of the following tests:  Fasting blood glucose test. You will not eat for at least 8 hours and then take a blood test.  Random blood glucose test. Your blood glucose (sugar) is checked at any time of the day regardless of when you ate.  Oral glucose tolerance test (OGTT). Your blood glucose is measured after you have not eaten (fasted) and then after you drink a glucose containing beverage. TREATMENT   Healthy eating.  Exercise.  Medicine, if needed.  Monitoring blood glucose.  Seeing your caregiver regularly. HOME CARE INSTRUCTIONS   Check your blood glucose at least once a day. More frequent monitoring may be necessary, depending on your medicines and on how well your diabetes is controlled. Your caregiver will advise you.  Take your medicine as directed by your caregiver.  Do not smoke.  Make wise food choices. Ask your caregiver for information. Weight loss can improve your diabetes.    Learn about low blood glucose (hypoglycemia) and how to treat it.  Get your eyes checked regularly.  Have a yearly physical exam. Have your blood pressure checked and your blood and urine tested.  Wear a pendant or bracelet saying that you have diabetes.  Check your feet every night for cuts, sores, blisters, and redness. Let your caregiver know if you have any problems. SEEK MEDICAL CARE IF:   You have problems keeping your blood glucose in target range.  You have problems with your medicines.  You have symptoms of an illness that do not improve after 24 hours.  You have a sore or wound that is not healing.  You notice a change in vision or a new problem with your vision.  You have a  fever. MAKE SURE YOU:  Understand these instructions.  Will watch your condition.  Will get help right away if you are not doing well or get worse. Document Released: 07/30/2005 Document Revised: 10/22/2011 Document Reviewed: 01/15/2011 [employer] Patient Information 2013 [employer].  

## 2012-06-11 NOTE — Assessment & Plan Note (Signed)
FLP CMP TSH today 

## 2012-06-11 NOTE — Assessment & Plan Note (Signed)

## 2012-06-17 ENCOUNTER — Ambulatory Visit
Admission: RE | Admit: 2012-06-17 | Discharge: 2012-06-17 | Disposition: A | Discharge status: Home / Self Care | Payer: Medicare PPO | Source: Ambulatory Visit | Attending: Thoracic Surgery | Admitting: Thoracic Surgery

## 2012-06-17 ENCOUNTER — Encounter: Payer: Self-pay | Admitting: Thoracic Surgery (Cardiothoracic Vascular Surgery)

## 2012-06-17 ENCOUNTER — Ambulatory Visit (INDEPENDENT_AMBULATORY_CARE_PROVIDER_SITE_OTHER): Payer: Medicare PPO | Admitting: Thoracic Surgery (Cardiothoracic Vascular Surgery)

## 2012-06-17 VITALS — BP 155/83 | HR 75 | Resp 20 | Ht 69.0 in | Wt 260.0 lb

## 2012-06-17 DIAGNOSIS — R222 Localized swelling, mass and lump, trunk: Secondary | ICD-10-CM

## 2012-06-17 DIAGNOSIS — R911 Solitary pulmonary nodule: Secondary | ICD-10-CM

## 2012-06-17 NOTE — Progress Notes (Signed)
  HPI:  Mr. Celona is a 66 year old gentleman with a history of tobacco abuse and COPD. He has been followed by Dr. Edwyna Shell for a right upper lobe nodule since January of 2011. He last saw Dr. Edwyna Shell in March which time the lesion was stable by chest x-ray. He now presents with a CT of the chest for followup.  He says he quit smoking last Thanksgiving, so it has been about  a year now. He does use electronic cigarettes, which helped with his cravings. He does get short of breath with exertion, that is essentially unchanged. His weight has been stable.  Past Medical History  Diagnosis Date  . Hypertension   . Hyperlipidemia   . COPD (chronic obstructive pulmonary disease)   . BPH (benign prostatic hyperplasia)   . Osteoarthritis       Current Outpatient Prescriptions  Medication Sig Dispense Refill  . albuterol (VENTOLIN HFA) 108 (90 BASE) MCG/ACT inhaler Inhale 2 puffs into the lungs 4 (four) times daily.  1 Inhaler  11  . amLODipine-olmesartan (AZOR) 5-40 MG per tablet Take 1 tablet by mouth daily.  140 tablet  0  . cetirizine (ZYRTEC) 10 MG tablet Take 1 tablet (10 mg total) by mouth daily.  30 tablet  11  . Fluticasone-Salmeterol (ADVAIR DISKUS) 250-50 MCG/DOSE AEPB Inhale 1 puff into the lungs 2 (two) times daily.        . roflumilast (DALIRESP) 500 MCG TABS tablet Take 1 tablet (500 mcg total) by mouth daily.  30 tablet  11  . terazosin (HYTRIN) 5 MG capsule Take 5 mg by mouth at bedtime.          Physical Exam BP 155/83  Pulse 75  Resp 20  Ht 5\' 9"  (1.753 m)  Wt 260 lb (117.935 kg)  BMI 38.40 kg/m2  SpO54 30% 66 year old male in no acute distress Gen. overweight but otherwise well-developed well-nourished Lungs diminished breath sounds bilaterally Cardiac regular rate and rhythm normal S1 and S2 No cervical or supraclavicular adenopathy  Diagnostic Tests: CT chest 06/17/12 CT CHEST WITHOUT CONTRAST  Technique: Multidetector CT imaging of the chest was performed    following the standard protocol without IV contrast.  Comparison: 08/02/2011  Findings: Lobular nodule in the anterior right upper lobe again  noted, measuring 1.8 x 1.2 cm. This is unchanged. No additional  pulmonary nodules are noted. Severe emphysematous changes in the  lungs. Large bullae noted in the right upper lobe.  Heart is normal size. Aorta is calcified, non-aneurysmal. No  mediastinal, hilar, or axillary adenopathy. Visualized thyroid and  chest wall soft tissues unremarkable. Imaging into the upper  abdomen shows no acute findings.  No acute bony abnormality. Degenerative changes in the thoracic  spine.  IMPRESSION:  Stable right upper lobe nodule, 1.8 cm maximally.  Severe COPD.   Impression: 66 year old gentleman with a 1.8 cm right upper lobe nodule that is been stable for almost 2 years now. It is highly unlikely this represents a bronchogenic carcinoma. It most likely represents a benign tumor, although granulomatous disease cannot be ruled out. There does exist the possibility that this could be a slow-growing atypical tumor.  I recommended to him that we repeat a CT scan in 6 months, that will take his past the 2 year point. After that he can probably be followed with an annual low dose CT scan.  Plan: Return in 6 months with CT of chest.

## 2012-08-26 IMAGING — CR DG CHEST 2V
2 series · 2 of 2 positions shown · non-contrast
Comparison: 07/19/2011 and 08/01/2010

CLINICAL DATA: Cough.  Hypertension.  Recent ex-smoker with COPD.

CHEST - 2 VIEW

[view not recorded (1 of 2)]
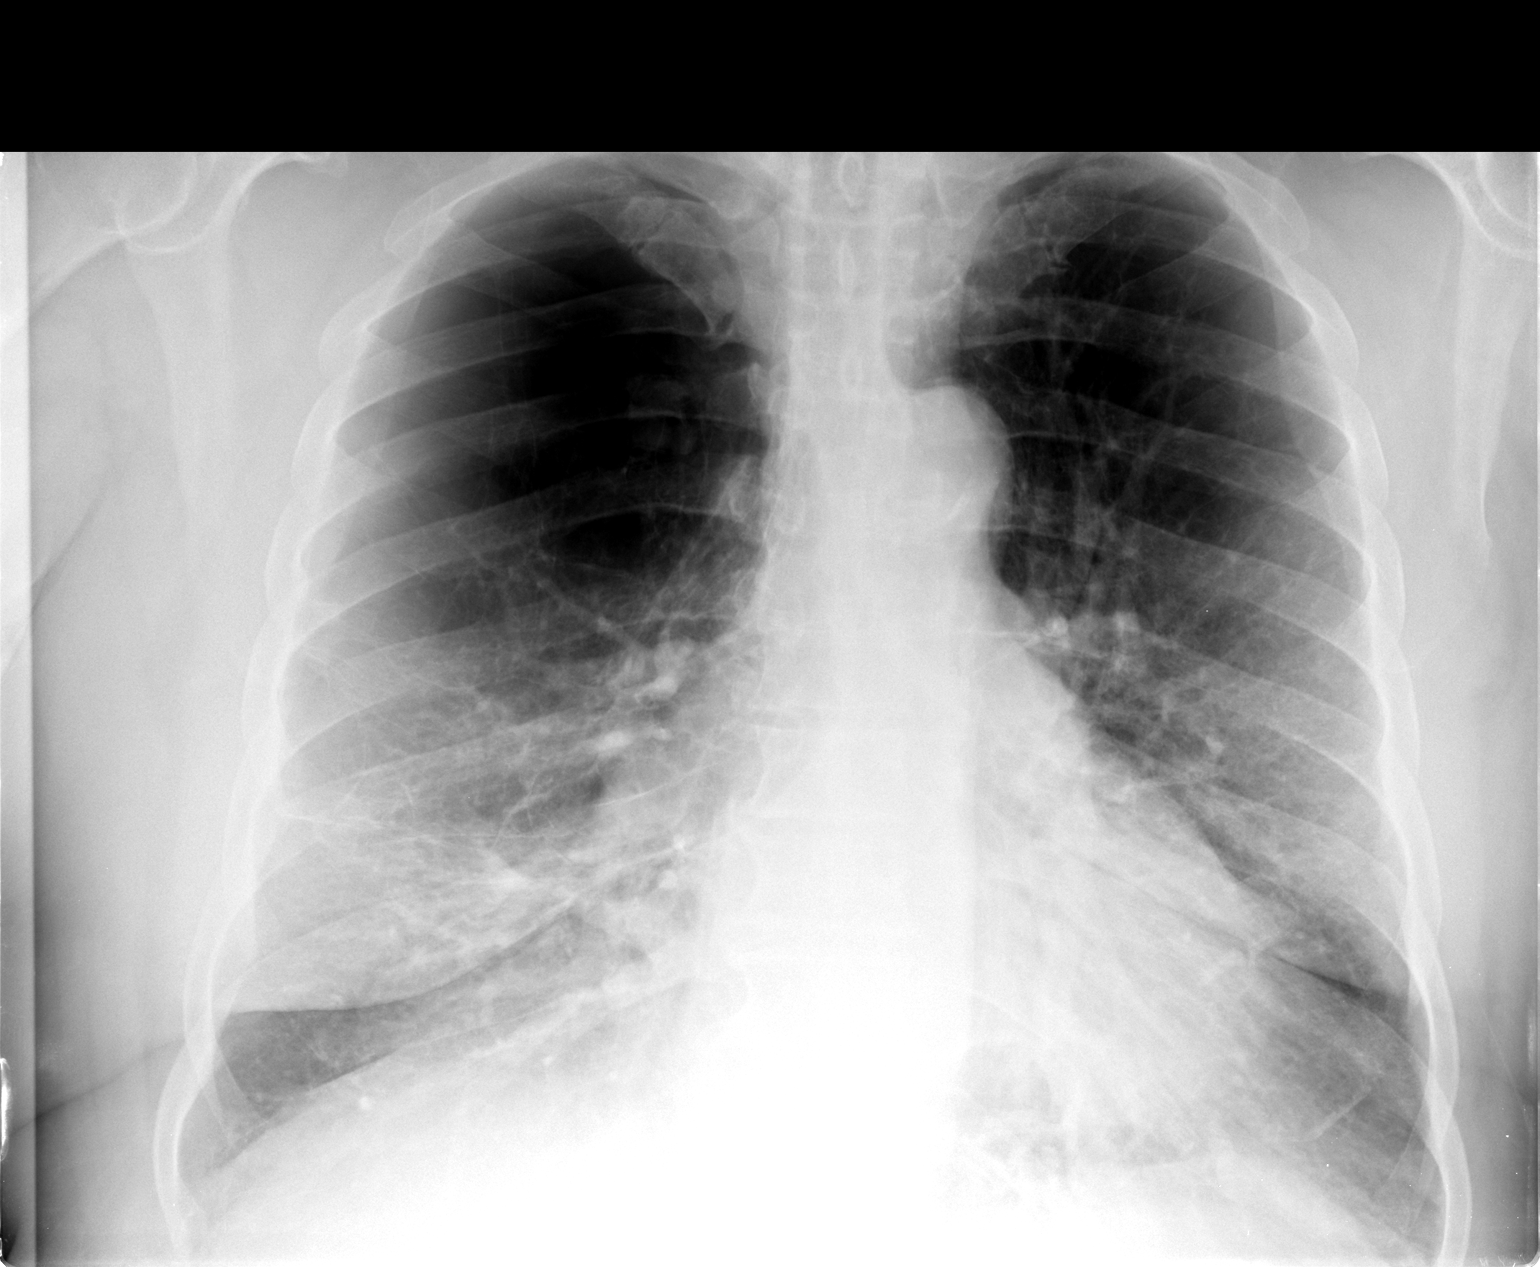

[view not recorded (2 of 2)]
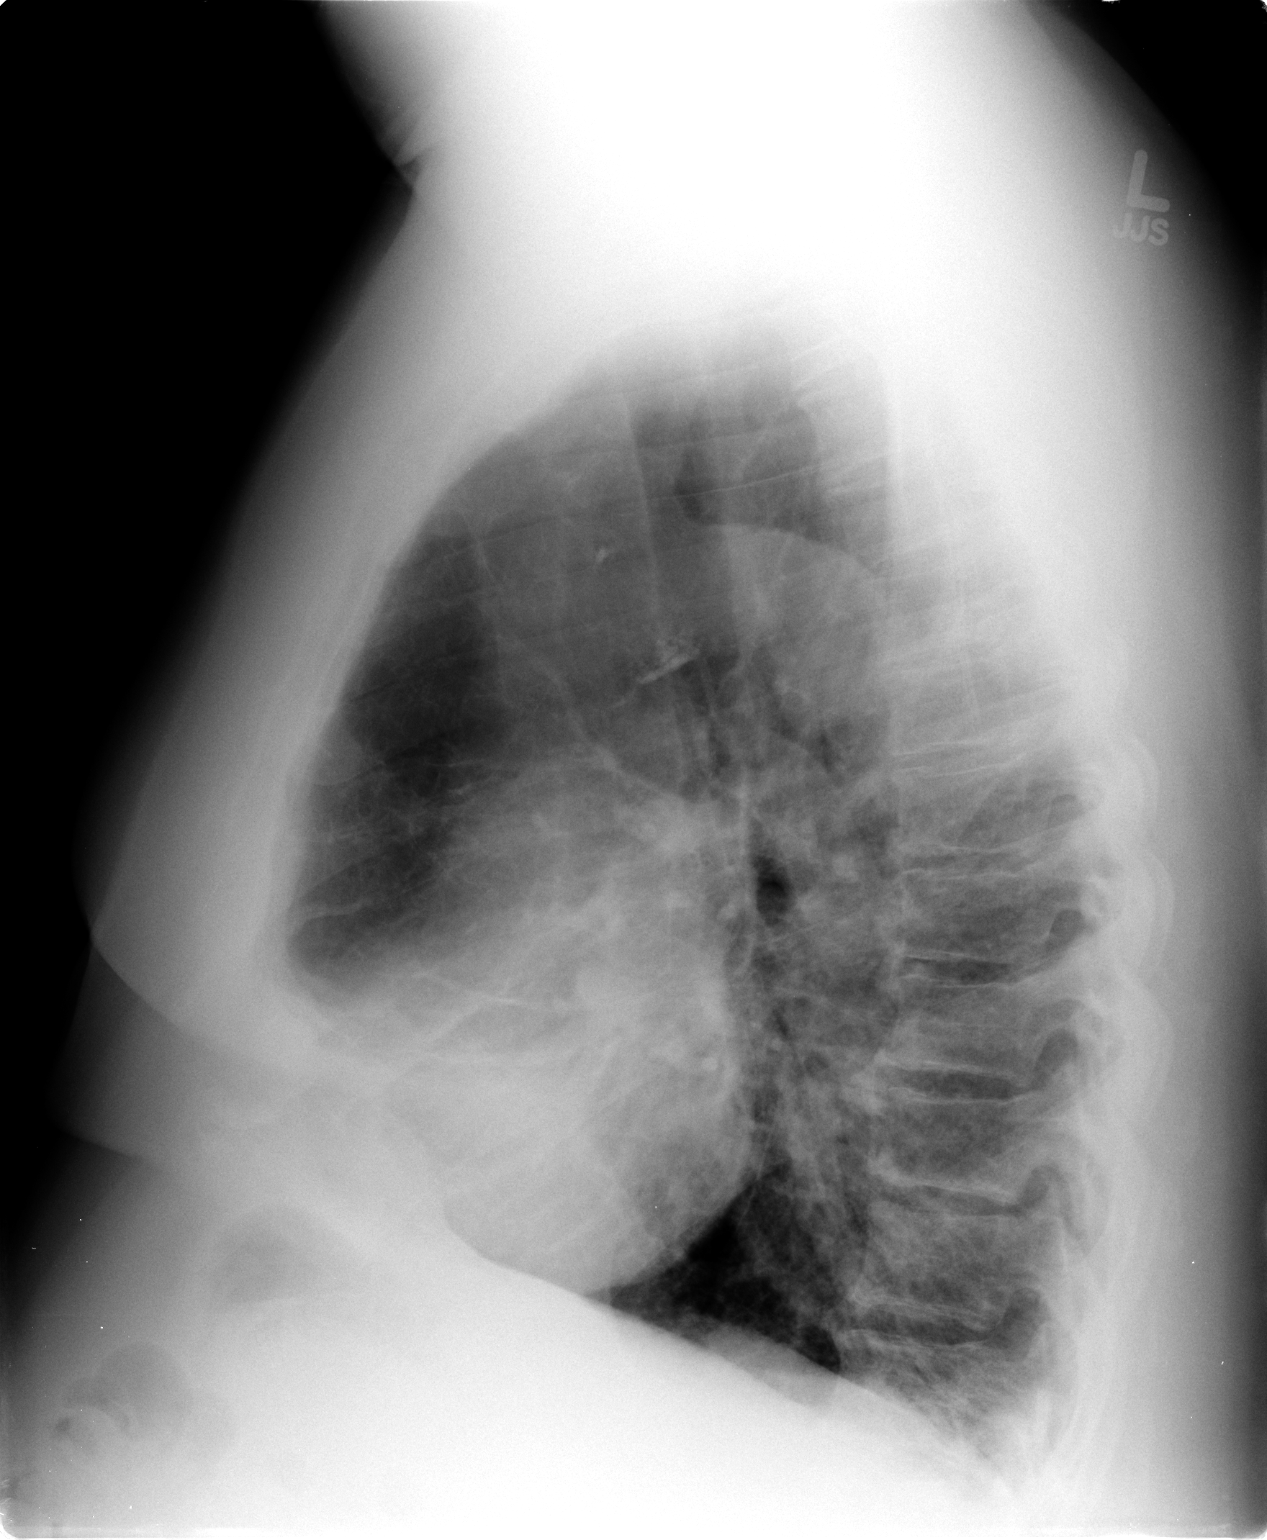

[2 of 2 positions shown; findings below may reference images not displayed]

FINDINGS: Underlying emphysematous changes are seen with coarse
interstitial markings at both lung bases and bullous changes at the
lung apices.  Findings are compatible with underlying COPD and
chronic interstitial and bronchitic change.  A stable lobulated
nodule is seen in the medial aspect of the right upper lobe.  No
new focal infiltrates or signs of congestive failure are
identified.  No pleural fluid or significant increase in
peribronchial cuffing is noted.

Bony structures appear intact.
IMPRESSION: COPD with stable chronic change and stable right upper lobe nodule.
Stable cardiomegaly.  No new focal or acute abnormality suggested.

## 2012-12-19 ENCOUNTER — Other Ambulatory Visit: Payer: Self-pay | Admitting: *Deleted

## 2012-12-19 DIAGNOSIS — R911 Solitary pulmonary nodule: Secondary | ICD-10-CM

## 2012-12-30 ENCOUNTER — Ambulatory Visit: Payer: Medicare PPO | Admitting: Thoracic Surgery (Cardiothoracic Vascular Surgery)

## 2012-12-30 ENCOUNTER — Other Ambulatory Visit: Payer: Medicare PPO

## 2013-02-12 ENCOUNTER — Ambulatory Visit: Payer: Self-pay | Admitting: Family Medicine

## 2013-02-12 VITALS — BP 130/76 | HR 88 | Temp 98.3°F | Resp 16 | Ht 68.5 in | Wt 250.0 lb

## 2013-02-12 DIAGNOSIS — J449 Chronic obstructive pulmonary disease, unspecified: Secondary | ICD-10-CM

## 2013-02-12 DIAGNOSIS — E119 Type 2 diabetes mellitus without complications: Secondary | ICD-10-CM

## 2013-02-12 DIAGNOSIS — Z0000 Encounter for general adult medical examination without abnormal findings: Secondary | ICD-10-CM

## 2013-02-12 DIAGNOSIS — I10 Essential (primary) hypertension: Secondary | ICD-10-CM

## 2013-02-12 DIAGNOSIS — Z0289 Encounter for other administrative examinations: Secondary | ICD-10-CM

## 2013-02-12 NOTE — Progress Notes (Signed)
[facility] [address] [place] [address] [phone]  Date:  02/12/2013   Name:  Jesse Garrison   DOB:  Jan 07, 1946   MRN:  [id number]  PCP:  Sanda Linger, MD    Chief Complaint: Annual Exam   History of Present Illness:  Jesse Garrison is a 67 y.o. very pleasant male patient who presents with the following:  He is here today for a DOT exam.  His PCP is Dr. Yetta Barre with [facility].  However, he has not followed- up in some time and knows that he needs an appt.  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  He does not take any medications for his DM.  He is supposed to use 3 different inhalers and uses a medication for HTN.    He states he does not have OSA.  He does tend to get SOB with walking but he does not have to walk much.  He drives a local route.   He has not had an eye exam in some time.  Admits that he has been having increased COPD symptoms as of late.    Patient Active Problem List   Diagnosis Date Noted  . Routine general medical examination at a health care facility 06/11/2012  . Snoring 12/18/2011  . Cataract 12/18/2011  . Type II or unspecified type diabetes mellitus without mention of complication, uncontrolled 07/19/2011  . MASS, LUNG 08/01/2010  . HYPERLIPIDEMIA 07/27/2010  . HYPERTENSION 07/27/2010  . COPD 07/27/2010  . BENIGN PROSTATIC HYPERTROPHY 07/27/2010  . OSTEOARTHRITIS 07/27/2010    Past Medical History  Diagnosis Date  . Hypertension   . Hyperlipidemia   . COPD (chronic obstructive pulmonary disease)   . BPH (benign prostatic hyperplasia)   . Osteoarthritis     No past surgical history on file.  History  Substance Use Topics  . Smoking status: Former Smoker -- 1.00 packs/day for 42 years    Types: Cigarettes    Quit date: 06/19/2011  . Smokeless tobacco: Never Used  . Alcohol Use: No    Family History  Problem Relation Age of Onset  .  Diabetes Other     No Known Allergies  Medication list has been reviewed and updated.  Current Outpatient Prescriptions on File Prior to Visit  Medication Sig Dispense Refill  . albuterol (VENTOLIN HFA) 108 (90 BASE) MCG/ACT inhaler Inhale 2 puffs into the lungs 4 (four) times daily.  1 Inhaler  11  . Fluticasone-Salmeterol (ADVAIR DISKUS) 250-50 MCG/DOSE AEPB Inhale 1 puff into the lungs 2 (two) times daily.        Marland Kitchen amLODipine-olmesartan (AZOR) 5-40 MG per tablet Take 1 tablet by mouth daily.  140 tablet  0  . roflumilast (DALIRESP) 500 MCG TABS tablet Take 1 tablet (500 mcg total) by mouth daily.  30 tablet  11  . terazosin (HYTRIN) 5 MG capsule Take 5 mg by mouth at bedtime.         No current facility-administered medications on file prior to visit.    Review of Systems:  As per HPI- otherwise negative.   Physical Examination: Filed Vitals:   02/12/13 1429  BP: 164/88  Pulse: 88  Temp: 98.3 F (36.8 C)  Resp: 16   Filed Vitals:   02/12/13 1429  Height: 5' 8.5" (1.74 m)  Weight: 250 lb (113.399 kg)   Body mass index  is 37.46 kg/(m^2). Ideal Body Weight: Weight in (lb) to have BMI = 25: 166.5  GEN: WDWN, NAD, Non-toxic, A & O x 3, obese HEENT: Atraumatic, Normocephalic. Neck supple. No masses, No LAD.  Bilateral TM wnl, oropharynx normal.  PEERL,EOMI.   Ears and Nose: No external deformity. CV: RRR, No M/G/R. No JVD. No thrill. No extra heart sounds. PULM: CTA B, no wheezes, crackles, rhonchi. No retractions. No distress but does get winded easily.   ABD: S, NT, ND, +BS. No rebound. No HSM. EXTR: No c/c/e NEURO Normal gait. Normal strength and ROM all extremities, normal DTR all extremities PSYCH: Normally interactive. Conversant. Not depressed or anxious appearing.  Calm demeanor.  No inguinal hernia  He has been out of his advair and daliresp for some time- more than a month  Performed PFTs- he did not meet DOT requirements  Assessment and Plan: Physical  exam  COPD (chronic obstructive pulmonary disease)  Accelerated hypertension  Type II or unspecified type diabetes mellitus without mention of complication, not stated as uncontrolled  Jesse Garrison is here today for a DOT exam.  He does not pass today due to poorly controlled COPD with poor PFT results.  He has been off his medications for a while which likely contributes to his symptoms HTN is controlled Last A1c in 05/2012 was 7.4, and he does not have any glucosuria.    Jesse Garrison will follow- up with his PCP and get back on his medications.  He will ask Dr. Yetta Barre to repeat his PFTs once he is feeling better in hopes of qualifying for a DOT card.  Given information about PFT requirement for him to share with Dr. Yetta Barre.    Did not charge [facility] for the PFTs today as finances are tight for him and he has to pay for his DOT exam out of pocket.    Signed Abbe Amsterdam, MD

## 2013-02-12 NOTE — Patient Instructions (Addendum)
Please schedule an appt to see Dr. Yetta Barre as soon as you can for a check- up.   You need "pulmonary function testing" to proceed with your DOT certification. This can likely be done through [facility]- ask when you call for your appt. Any problems please let me know! Some offices are able to do pulmonary function testing right in the office.  Your pulmonary function testing here was not good enough to qualify for a DOT card.  I expect you will do better once you are back on your medications.    As soon as you have this information please drop it off for me.

## 2013-02-16 ENCOUNTER — Other Ambulatory Visit (INDEPENDENT_AMBULATORY_CARE_PROVIDER_SITE_OTHER): Payer: Medicare PPO

## 2013-02-16 ENCOUNTER — Encounter: Payer: Self-pay | Admitting: Internal Medicine

## 2013-02-16 ENCOUNTER — Ambulatory Visit (INDEPENDENT_AMBULATORY_CARE_PROVIDER_SITE_OTHER): Payer: Medicare PPO | Admitting: Internal Medicine

## 2013-02-16 VITALS — BP 158/76 | HR 93 | Temp 98.2°F | Resp 16 | Ht 68.0 in | Wt 257.0 lb

## 2013-02-16 DIAGNOSIS — Z0000 Encounter for general adult medical examination without abnormal findings: Secondary | ICD-10-CM

## 2013-02-16 DIAGNOSIS — R222 Localized swelling, mass and lump, trunk: Secondary | ICD-10-CM

## 2013-02-16 DIAGNOSIS — J4489 Other specified chronic obstructive pulmonary disease: Secondary | ICD-10-CM

## 2013-02-16 DIAGNOSIS — N400 Enlarged prostate without lower urinary tract symptoms: Secondary | ICD-10-CM

## 2013-02-16 DIAGNOSIS — IMO0001 Reserved for inherently not codable concepts without codable children: Secondary | ICD-10-CM

## 2013-02-16 DIAGNOSIS — J449 Chronic obstructive pulmonary disease, unspecified: Secondary | ICD-10-CM

## 2013-02-16 DIAGNOSIS — H269 Unspecified cataract: Secondary | ICD-10-CM

## 2013-02-16 DIAGNOSIS — I10 Essential (primary) hypertension: Secondary | ICD-10-CM

## 2013-02-16 DIAGNOSIS — E785 Hyperlipidemia, unspecified: Secondary | ICD-10-CM

## 2013-02-16 LAB — CBC WITH DIFFERENTIAL/PLATELET
Basophils Absolute: 0 10*3/uL (ref 0.0–0.1)
Basophils Relative: 0.3 % (ref 0.0–3.0)
Eosinophils Relative: 3.2 % (ref 0.0–5.0)
HCT: 42.6 % (ref 39.0–52.0)
Hemoglobin: 14 g/dL (ref 13.0–17.0)
Lymphocytes Relative: 19 % (ref 12.0–46.0)
Lymphs Abs: 2.1 10*3/uL (ref 0.7–4.0)
Monocytes Relative: 6 % (ref 3.0–12.0)
Neutro Abs: 8 10*3/uL — ABNORMAL HIGH (ref 1.4–7.7)
RBC: 5.14 Mil/uL (ref 4.22–5.81)
WBC: 11.2 10*3/uL — ABNORMAL HIGH (ref 4.5–10.5)

## 2013-02-16 LAB — COMPREHENSIVE METABOLIC PANEL
ALT: 20 U/L (ref 0–53)
BUN: 16 mg/dL (ref 6–23)
CO2: 27 mEq/L (ref 19–32)
Calcium: 9.4 mg/dL (ref 8.4–10.5)
Chloride: 105 mEq/L (ref 96–112)
Creatinine, Ser: 1.2 mg/dL (ref 0.4–1.5)
GFR: 77.49 mL/min (ref >60.00)
Glucose, Bld: 183 mg/dL — ABNORMAL HIGH (ref 70–99)
Total Bilirubin: 0.6 mg/dL (ref 0.3–1.2)

## 2013-02-16 LAB — LIPID PANEL
Total CHOL/HDL Ratio: 5
Triglycerides: 90 mg/dL (ref 0.0–149.0)

## 2013-02-16 LAB — TSH: TSH: 0.55 u[IU]/mL (ref 0.35–5.50)

## 2013-02-16 LAB — MICROALBUMIN / CREATININE URINE RATIO
Creatinine,U: 248.1 mg/dL
Microalb, Ur: 1.2 mg/dL (ref 0.0–1.9)

## 2013-02-16 LAB — HEPATITIS C ANTIBODY: HCV Ab: NEGATIVE

## 2013-02-16 LAB — HEMOGLOBIN A1C: Hgb A1c MFr Bld: 7.7 % — ABNORMAL HIGH (ref 4.6–6.5)

## 2013-02-16 MED ORDER — AMLODIPINE-OLMESARTAN 5-40 MG PO TABS: 1.0000 | ORAL_TABLET | Freq: Every day | ORAL | Status: DC

## 2013-02-16 MED ORDER — ROFLUMILAST 500 MCG PO TABS: 500.0000 ug | ORAL_TABLET | Freq: Every day | ORAL | Status: DC

## 2013-02-16 MED ORDER — SITAGLIP PHOS-METFORMIN HCL ER 50-1000 MG PO TB24: 1.0000 | ORAL_TABLET | Freq: Every day | ORAL | Status: DC

## 2013-02-16 MED ORDER — PITAVASTATIN CALCIUM 4 MG PO TABS: 1.0000 | ORAL_TABLET | Freq: Every day | ORAL | Status: DC

## 2013-02-16 MED ORDER — FLUTICASONE-SALMETEROL 250-50 MCG/DOSE IN AEPB: 1.0000 | INHALATION_SPRAY | Freq: Two times a day (BID) | RESPIRATORY_TRACT | Status: DC

## 2013-02-16 NOTE — Assessment & Plan Note (Signed)
No testing ordered on this at his request

## 2013-02-16 NOTE — Assessment & Plan Note (Addendum)

## 2013-02-16 NOTE — Assessment & Plan Note (Signed)
Continue hytrin I will check his PSA today

## 2013-02-16 NOTE — Assessment & Plan Note (Signed)
I needs to have PFT's done for his [facility] Pulm referral

## 2013-02-16 NOTE — Progress Notes (Signed)
Subjective:    Patient ID: Jesse Garrison, male    DOB: [birth year], 67 y.o.   MRN: [id number]  Hypertension This is a chronic problem. The current episode started more than 1 year ago. The problem is unchanged. The problem is uncontrolled. Associated symptoms include shortness of breath. Pertinent negatives include no anxiety, blurred vision, chest pain, headaches, malaise/fatigue, neck pain, orthopnea, palpitations, peripheral edema, PND or sweats. Past treatments include angiotensin blockers and calcium channel blockers. The current treatment provides moderate improvement. Compliance problems include exercise, diet, medication cost and psychosocial issues (he has not been taking azor, "I ran out").  Identifiable causes of hypertension include sleep apnea.      Review of Systems  Constitutional: Negative.  Negative for malaise/fatigue.  HENT: Negative.  Negative for neck pain.   Eyes: Negative.  Negative for blurred vision.  Respiratory: Positive for apnea, shortness of breath and wheezing. Negative for cough, choking, chest tightness and stridor.   Cardiovascular: Negative.  Negative for chest pain, palpitations, orthopnea, leg swelling and PND.  Gastrointestinal: Negative.  Negative for nausea, vomiting, abdominal pain, diarrhea and constipation.  Endocrine: Negative.  Negative for polydipsia, polyphagia and polyuria.  Genitourinary: Negative.  Negative for dysuria, frequency, hematuria, flank pain, enuresis and difficulty urinating.  Musculoskeletal: Negative.   Skin: Negative.   Allergic/Immunologic: Negative.   Neurological: Negative.  Negative for weakness and headaches.  Hematological: Negative.   Psychiatric/Behavioral: Negative.        Objective:   Physical Exam  Vitals reviewed. Constitutional: He appears well-developed and well-nourished. No distress.  HENT:  Head: Normocephalic and atraumatic.  Mouth/Throat: Oropharynx is clear and moist. No oropharyngeal exudate.  Eyes:  Conjunctivae are normal. Right eye exhibits no discharge. Left eye exhibits no discharge. No scleral icterus.  Neck: Normal range of motion. Neck supple. No JVD present. No tracheal deviation present. No thyromegaly present.  Cardiovascular: Normal rate, regular rhythm, normal heart sounds and intact distal pulses.  Exam reveals no gallop and no friction rub.   No murmur heard. Pulmonary/Chest: Effort normal and breath sounds normal. No stridor. No respiratory distress. He has no wheezes. He has no rales. He exhibits no tenderness.  Abdominal: Soft. Bowel sounds are normal. He exhibits no distension and no mass. There is no tenderness. There is no rebound and no guarding. Hernia confirmed negative in the right inguinal area and confirmed negative in the left inguinal area.  Genitourinary: Rectum normal, testes normal and penis normal. Rectal exam shows no external hemorrhoid, no internal hemorrhoid, no fissure, no mass, no tenderness and anal tone normal. Guaiac negative stool. Prostate is enlarged (1+ smooth symm BPH). Prostate is not tender. Right testis shows no mass, no swelling and no tenderness. Right testis is descended. Left testis shows no mass, no swelling and no tenderness. Left testis is descended. Circumcised. No penile erythema or penile tenderness. No discharge found.  Musculoskeletal: Normal range of motion. He exhibits no edema and no tenderness.  Lymphadenopathy:    He has no cervical adenopathy.       Right: No inguinal adenopathy present.       Left: No inguinal adenopathy present.  Skin: He is not diaphoretic.     Lab Results  Component Value Date   WBC 9.6 10/25/2011   HGB 13.4 10/25/2011   HCT 42.2 10/25/2011   PLT 163.0 10/25/2011   GLUCOSE 130* 06/11/2012   CHOL 199 06/11/2012   TRIG 91.0 06/11/2012   HDL 36.80* 06/11/2012   LDLCALC 144* 06/11/2012  ALT 21 06/11/2012   AST 21 06/11/2012   NA 138 06/11/2012   K 4.4 06/11/2012   CL 104 06/11/2012   CREATININE 1.5  06/11/2012   BUN 18 06/11/2012   CO2 26 06/11/2012   TSH 0.88 06/11/2012   PSA 3.22 10/25/2011   HGBA1C 7.4* 06/11/2012       Assessment & Plan:

## 2013-02-16 NOTE — Assessment & Plan Note (Signed)
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

## 2013-02-16 NOTE — Assessment & Plan Note (Signed)
Start livalo 

## 2013-02-16 NOTE — Assessment & Plan Note (Signed)
I will recheck his A1C and renal function today He was referred for an eye exam I have asked him to start taking Janumet-XR

## 2013-02-16 NOTE — Patient Instructions (Signed)
Type 2 Diabetes Mellitus, Adult Type 2 diabetes mellitus, often simply referred to as type 2 diabetes, is a long-lasting (chronic) disease. In type 2 diabetes, the pancreas does not make enough insulin (a hormone), the cells are less responsive to the insulin that is made (insulin resistance), or both. Normally, insulin moves sugars from food into the tissue cells. The tissue cells use the sugars for energy. The lack of insulin or the lack of normal response to insulin causes excess sugars to build up in the blood instead of going into the tissue cells. As a result, high blood sugar (hyperglycemia) develops. The effect of high sugar (glucose) levels can cause many complications. Type 2 diabetes was also previously called adult-onset diabetes but it can occur at any age.  RISK FACTORS  A person is predisposed to developing type 2 diabetes if someone in the family has the disease and also has one or more of the following primary risk factors:  Overweight.  An inactive lifestyle.  A history of consistently eating high-calorie foods. Maintaining a normal weight and regular physical activity can reduce the chance of developing type 2 diabetes. SYMPTOMS  A person with type 2 diabetes may not show symptoms initially. The symptoms of type 2 diabetes appear slowly. The symptoms include:  Increased thirst (polydipsia).  Increased urination (polyuria).  Increased urination during the night (nocturia).  Weight loss. This weight loss may be rapid.  Frequent, recurring infections.  Tiredness (fatigue).  Weakness.  Vision changes, such as blurred vision.  Fruity smell to your breath.  Abdominal pain.  Nausea or vomiting.  Cuts or bruises which are slow to heal.  Tingling or numbness in the hands or feet. DIAGNOSIS Type 2 diabetes is frequently not diagnosed until complications of diabetes are present. Type 2 diabetes is diagnosed when symptoms or complications are present and when blood  glucose levels are increased. Your blood glucose level may be checked by one or more of the following blood tests:  A fasting blood glucose test. You will not be allowed to eat for at least 8 hours before a blood sample is taken.  A random blood glucose test. Your blood glucose is checked at any time of the day regardless of when you ate.  A hemoglobin A1c blood glucose test. A hemoglobin A1c test provides information about blood glucose control over the previous 3 months.  An oral glucose tolerance test (OGTT). Your blood glucose is measured after you have not eaten (fasted) for 2 hours and then after you drink a glucose-containing beverage. TREATMENT   You may need to take insulin or diabetes medicine daily to keep blood glucose levels in the desired range.  You will need to match insulin dosing with exercise and healthy food choices. The treatment goal is to maintain the before meal blood sugar (preprandial glucose) level at 70 130 mg/dL. HOME CARE INSTRUCTIONS   Have your hemoglobin A1c level checked twice a year.  Perform daily blood glucose monitoring as directed by your caregiver.  Monitor urine ketones when you are ill and as directed by your caregiver.  Take your diabetes medicine or insulin as directed by your caregiver to maintain your blood glucose levels in the desired range.  Never run out of diabetes medicine or insulin. It is needed every day.  Adjust insulin based on your intake of carbohydrates. Carbohydrates can raise blood glucose levels but need to be included in your diet. Carbohydrates provide vitamins, minerals, and fiber which are an essential part of   a healthy diet. Carbohydrates are found in fruits, vegetables, whole grains, dairy products, legumes, and foods containing added sugars.    Eat healthy foods. Alternate 3 meals with 3 snacks.  Lose weight if overweight.  Carry a medical alert card or wear your medical alert jewelry.  Carry a 15 gram  carbohydrate snack with you at all times to treat low blood glucose (hypoglycemia). Some examples of 15 gram carbohydrate snacks include:  Glucose tablets, 3 or 4   Glucose gel, 15 gram tube  Raisins, 2 tablespoons (24 grams)  Jelly beans, 6  Animal crackers, 8  Regular pop, 4 ounces (120 mL)  Gummy treats, 9  Recognize hypoglycemia. Hypoglycemia occurs with blood glucose levels of 70 mg/dL and below. The risk for hypoglycemia increases when fasting or skipping meals, during or after intense exercise, and during sleep. Hypoglycemia symptoms can include:  Tremors or shakes.  Decreased ability to concentrate.  Sweating.  Increased heart rate.  Headache.  Dry mouth.  Hunger.  Irritability.  Anxiety.  Restless sleep.  Altered speech or coordination.  Confusion.  Treat hypoglycemia promptly. If you are alert and able to safely swallow, follow the 15:15 rule:  Take 15 20 grams of rapid-acting glucose or carbohydrate. Rapid-acting options include glucose gel, glucose tablets, or 4 ounces (120 mL) of fruit juice, regular soda, or low fat milk.  Check your blood glucose level 15 minutes after taking the glucose.  Take 15 20 grams more of glucose if the repeat blood glucose level is still 70 mg/dL or below.  Eat a meal or snack within 1 hour once blood glucose levels return to normal.    Be alert to polyuria and polydipsia which are early signs of hyperglycemia. An early awareness of hyperglycemia allows for prompt treatment. Treat hyperglycemia as directed by your caregiver.  Engage in at least 150 minutes of moderate-intensity physical activity a week, spread over at least 3 days of the week or as directed by your caregiver. In addition, you should engage in resistance exercise at least 2 times a week or as directed by your caregiver.  Adjust your medicine and food intake as needed if you start a new exercise or sport.  Follow your sick day plan at any time you  are unable to eat or drink as usual.  Avoid tobacco use.  Limit alcohol intake to no more than 1 drink per day for nonpregnant women and 2 drinks per day for men. You should drink alcohol only when you are also eating food. Talk with your caregiver whether alcohol is safe for you. Tell your caregiver if you drink alcohol several times a week.  Follow up with your caregiver regularly.  Schedule an eye exam soon after the diagnosis of type 2 diabetes and then annually.  Perform daily skin and foot care. Examine your skin and feet daily for cuts, bruises, redness, nail problems, bleeding, blisters, or sores. A foot exam by a caregiver should be done annually.  Brush your teeth and gums at least twice a day and floss at least once a day. Follow up with your dentist regularly.  Share your diabetes management plan with your workplace or school.  Stay up-to-date with immunizations.  Learn to manage stress.  Obtain ongoing diabetes education and support as needed.  Participate in, or seek rehabilitation as needed to maintain or improve independence and quality of life. Request a physical or occupational therapy referral if you are having foot or hand numbness or difficulties with grooming,   dressing, eating, or physical activity. SEEK MEDICAL CARE IF:   You are unable to eat food or drink fluids for more than 6 hours.  You have nausea and vomiting for more than 6 hours.  Your blood glucose level is over 240 mg/dL.  There is a change in mental status.  You develop an additional serious illness.  You have diarrhea for more than 6 hours.  You have been sick or have had a fever for a couple of days and are not getting better.  You have pain during any physical activity.  SEEK IMMEDIATE MEDICAL CARE IF:  You have difficulty breathing.  You have moderate to large ketone levels. MAKE SURE YOU:  Understand these instructions.  Will watch your condition.  Will get help right away if  you are not doing well or get worse. Document Released: 07/30/2005 Document Revised: 04/23/2012 Document Reviewed: 02/26/2012 Exit[employer] Patient Information 2014 [employer]. Health Maintenance, Males A healthy lifestyle and preventative care can promote health and wellness.  Maintain regular health, dental, and eye exams.  Eat a healthy diet. Foods like vegetables, fruits, whole grains, low-fat dairy products, and lean protein foods contain the nutrients you need without too many calories. Decrease your intake of foods high in solid fats, added sugars, and salt. Get information about a proper diet from your caregiver, if necessary.  Regular physical exercise is one of the most important things you can do for your health. Most adults should get at least 150 minutes of moderate-intensity exercise (any activity that increases your heart rate and causes you to sweat) each week. In addition, most adults need muscle-strengthening exercises on 2 or more days a week.   Maintain a healthy weight. The body mass index (BMI) is a screening tool to identify possible weight problems. It provides an estimate of body fat based on height and weight. Your caregiver can help determine your BMI, and can help you achieve or maintain a healthy weight. For adults 20 years and older:  A BMI below 18.5 is considered underweight.  A BMI of 18.5 to 24.9 is normal.  A BMI of 25 to 29.9 is considered overweight.  A BMI of 30 and above is considered obese.  Maintain normal blood lipids and cholesterol by exercising and minimizing your intake of saturated fat. Eat a balanced diet with plenty of fruits and vegetables. Blood tests for lipids and cholesterol should begin at age 20 and be repeated every 5 years. If your lipid or cholesterol levels are high, you are over 50, or you are a high risk for heart disease, you may need your cholesterol levels checked more frequently.Ongoing high lipid and cholesterol levels should  be treated with medicines, if diet and exercise are not effective.  If you smoke, find out from your caregiver how to quit. If you do not use tobacco, do not start.  If you choose to drink alcohol, do not exceed 2 drinks per day. One drink is considered to be 12 ounces (355 mL) of beer, 5 ounces (148 mL) of wine, or 1.5 ounces (44 mL) of liquor.  Avoid use of street drugs. Do not share needles with anyone. Ask for help if you need support or instructions about stopping the use of drugs.  High blood pressure causes heart disease and increases the risk of stroke. Blood pressure should be checked at least every 1 to 2 years. Ongoing high blood pressure should be treated with medicines if weight loss and exercise are not effective.    If you are 45 to 68 years old, ask your caregiver if you should take aspirin to prevent heart disease.  Diabetes screening involves taking a blood sample to check your fasting blood sugar level. This should be done once every 3 years, after age 45, if you are within normal weight and without risk factors for diabetes. Testing should be considered at a younger age or be carried out more frequently if you are overweight and have at least 1 risk factor for diabetes.  Colorectal cancer can be detected and often prevented. Most routine colorectal cancer screening begins at the age of 50 and continues through age 75. However, your caregiver may recommend screening at an earlier age if you have risk factors for colon cancer. On a yearly basis, your caregiver may provide home test kits to check for hidden blood in the stool. Use of a small camera at the end of a tube, to directly examine the colon (sigmoidoscopy or colonoscopy), can detect the earliest forms of colorectal cancer. Talk to your caregiver about this at age 50, when routine screening begins. Direct examination of the colon should be repeated every 5 to 10 years through age 75, unless early forms of pre-cancerous polyps or  small growths are found.  Hepatitis C blood testing is recommended for all people born from 1945 through 1965 and any individual with known risks for hepatitis C.  Healthy men should no longer receive prostate-specific antigen (PSA) blood tests as part of routine cancer screening. Consult with your caregiver about prostate cancer screening.  Testicular cancer screening is not recommended for adolescents or adult males who have no symptoms. Screening includes self-exam, caregiver exam, and other screening tests. Consult with your caregiver about any symptoms you have or any concerns you have about testicular cancer.  Practice safe sex. Use condoms and avoid high-risk sexual practices to reduce the spread of sexually transmitted infections (STIs).  Use sunscreen with a sun protection factor (SPF) of 30 or greater. Apply sunscreen liberally and repeatedly throughout the day. You should seek shade when your shadow is shorter than you. Protect yourself by wearing long sleeves, pants, a wide-brimmed hat, and sunglasses year round, whenever you are outdoors.  Notify your caregiver of new moles or changes in moles, especially if there is a change in shape or color. Also notify your caregiver if a mole is larger than the size of a pencil eraser.  A one-time screening for abdominal aortic aneurysm (AAA) and surgical repair of large AAAs by sound wave imaging (ultrasonography) is recommended for ages 65 to 75 years who are current or former smokers.  Stay current with your immunizations. Document Released: 01/26/2008 Document Revised: 10/22/2011 Document Reviewed: 12/25/2010 [employer] Patient Information 2014 [employer].  

## 2013-02-17 ENCOUNTER — Encounter: Payer: Self-pay | Admitting: Internal Medicine

## 2013-02-20 ENCOUNTER — Encounter: Payer: Self-pay | Admitting: Internal Medicine

## 2013-02-20 ENCOUNTER — Ambulatory Visit (INDEPENDENT_AMBULATORY_CARE_PROVIDER_SITE_OTHER): Payer: Medicare PPO | Admitting: Internal Medicine

## 2013-02-20 VITALS — BP 130/72 | HR 85 | Temp 98.2°F | Ht 68.0 in | Wt 253.4 lb

## 2013-02-20 DIAGNOSIS — J449 Chronic obstructive pulmonary disease, unspecified: Secondary | ICD-10-CM

## 2013-02-20 MED ORDER — FLUTICASONE FUROATE-VILANTEROL 100-25 MCG/INH IN AEPB: 1.0000 | INHALATION_SPRAY | Freq: Every morning | RESPIRATORY_TRACT | Status: DC

## 2013-02-20 NOTE — Progress Notes (Signed)
  Subjective:    Patient ID: Jesse Garrison, male    DOB: [birth year]  MRN: [id number]  HPI  34 yobm quit 2012 p disability for lungs in 2009 with wt about same with doe x exertion referred to pulmonary clinic 02/20/13  by Dr Leitha Schuller for abn pft's and DOT concerns.   02/20/2013 1st pulmonary eval / Sherene Sires cc  Indolent onset progressive x 10 y to point where doe x 100 ft at nl pace, no sob at rest, not sob at hs some better on alb twice daily  but not on advair ( not taking regularly) . Since stopped smoking symptoms have leveled off.   No obvious daytime variabilty or assoc chronic cough or cp or chest tightness, subjective wheeze overt sinus or hb symptoms. No unusual exp hx or h/o childhood pna/ asthma or knowledge of premature birth.   Sleeping ok without hypersomnolence and not aware of nocturnal  or early am exacerbation  of respiratory  c/o's or need for noct saba. Also denies any obvious fluctuation of symptoms with weather or environmental changes or other aggravating or alleviating factors except as outlined above   Review of Systems  Constitutional: Negative for fever, chills, activity change, appetite change and unexpected weight change.  HENT: Positive for congestion. Negative for sore throat, rhinorrhea, sneezing, trouble swallowing, dental problem, voice change and postnasal drip.   Eyes: Negative for visual disturbance.  Respiratory: Positive for shortness of breath. Negative for cough and choking.   Cardiovascular: Negative for chest pain and leg swelling.  Gastrointestinal: Negative for nausea, vomiting and abdominal pain.  Genitourinary: Negative for difficulty urinating.  Musculoskeletal: Negative for arthralgias.  Skin: Negative for rash.  Psychiatric/Behavioral: Negative for behavioral problems and confusion.       Objective:   Physical Exam   amb slt hoarse wm nad Wt Readings from Last 3 Encounters:  02/20/13 253 lb 6.4 oz (114.941 kg)  02/16/13 257 lb (116.574 kg)   02/12/13 250 lb (113.399 kg)     HEENT mild turbinate edema.  Oropharynx top dentures no thrush or excess pnd or cobblestoning.  No JVD or cervical adenopathy. Mild accessory muscle hypertrophy. Trachea midline, nl thryroid. Chest was hyperinflated by percussion with diminished breath sounds and moderate increased exp time without wheeze. Hoover sign positive at mid inspiration. Regular rate and rhythm without murmur gallop or rub or increase P2 or edema.  Abd: no hsm, nl excursion. Ext warm without cyanosis or clubbing.   No recent cxr on file      Assessment & Plan:

## 2013-02-20 NOTE — Patient Instructions (Addendum)
Start Breo one puff each am  Stop advair   Only use your albuterol as a rescue medication to be used if you can't catch your breath by resting or doing a relaxed purse lip breathing pattern. The less you use it, the better it will work when you need it.   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

## 2013-02-23 ENCOUNTER — Ambulatory Visit (INDEPENDENT_AMBULATORY_CARE_PROVIDER_SITE_OTHER): Payer: Self-pay | Admitting: Family Medicine

## 2013-02-23 VITALS — BP 158/78 | HR 82 | Temp 97.6°F | Resp 18 | Ht 68.5 in | Wt 254.0 lb

## 2013-02-23 DIAGNOSIS — Z024 Encounter for examination for driving license: Secondary | ICD-10-CM

## 2013-02-23 DIAGNOSIS — Z0289 Encounter for other administrative examinations: Secondary | ICD-10-CM

## 2013-02-23 NOTE — Progress Notes (Signed)
[facility] [address], [place] [address] [phone]  Date:  02/23/2013   Name:  Jesse Garrison   DOB:  1946/05/07   MRN:  [id number]  PCP:  Sanda Linger, MD    Chief Complaint: Follow-up   History of Present Illness:  Jesse Garrison is a 67 y.o. 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  He was here on 7/3 and was not qualified due to untreated COPD.  He has since seen Dr. Yetta Barre, his PCP for a CPE, and Dr. Sherene Sires to follow- up his COPD.   Dr. Sherene Sires put him on a new inhaler.  He is feeling better, less SOB.  He has not had any syncope or pre- syncope.  Dr. 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   He was also noted to have 20/50 vision in his left eye at his last visit.  He will see his eye doctor toward the end of August for a recheck.  We will recheck his vision today    Patient Active Problem List   Diagnosis Date Noted  . Hyperlipidemia LDL goal < 100 02/16/2013  . Routine general medical examination at a health care facility 06/11/2012  . Snoring 12/18/2011  . Cataract 12/18/2011  . Type II or unspecified type diabetes mellitus without mention of complication, uncontrolled 07/19/2011  . MASS, LUNG 08/01/2010  . HYPERTENSION 07/27/2010  . COPD GOLD III 07/27/2010  . BENIGN PROSTATIC HYPERTROPHY 07/27/2010  . OSTEOARTHRITIS 07/27/2010    Past Medical History  Diagnosis Date  . Hypertension   . Hyperlipidemia   . COPD (chronic obstructive pulmonary disease)   . BPH (benign prostatic hyperplasia)   . Osteoarthritis     No past surgical history on file.  History  Substance Use Topics  . Smoking status: Former Smoker -- 1.00 packs/day for 50 years    Types: Cigarettes    Quit date: 06/19/2011  . Smokeless tobacco: Never Used  . Alcohol Use: No    Family History  Problem Relation Age of Onset  . Diabetes Other   . Emphysema Mother     smoked     No Known Allergies  Medication list has been reviewed and updated.  Current Outpatient Prescriptions on File Prior to Visit  Medication Sig Dispense Refill  . albuterol (PROVENTIL HFA;VENTOLIN HFA) 108 (90 BASE) MCG/ACT inhaler Inhale 2 puffs into the lungs every 6 (six) hours as needed.      Marland Kitchen amLODipine-olmesartan (AZOR) 5-40 MG per tablet Take 1 tablet by mouth daily.  90 tablet  3  . Fluticasone Furoate-Vilanterol (BREO ELLIPTA) 100-25 MCG/INH AEPB Inhale 1 Inhaler into the lungs every morning.      . Multiple Vitamins-Minerals (CENTRUM SILVER PO) Take 1 tablet by mouth daily.      . Pitavastatin Calcium (LIVALO) 4 MG TABS Take 1 tablet (4 mg total) by mouth daily.  140 tablet  0  . SitaGLIPtin-MetFORMIN HCl (JANUMET XR) 50-1000 MG TB24 Take 1 tablet by mouth daily.  90 tablet  3  . terazosin (HYTRIN) 5 MG capsule Take 5 mg by mouth at bedtime.         No current facility-administered medications on file prior to visit.    Review of Systems:  As per HPI- otherwise negative.   Physical Examination: Filed Vitals:   02/23/13 1058  BP: 158/78  Pulse: 82  Temp:  97.6 F (36.4 C)  Resp: 18   Filed Vitals:   02/23/13 1058  Height: 5' 8.5" (1.74 m)  Weight: 254 lb (115.214 kg)   Body mass index is 38.05 kg/(m^2). Ideal Body Weight: Weight in (lb) to have BMI = 25: 166.5  GEN: WDWN, NAD, Non-toxic, A & O x 3, obese, does not appear SOB today HEENT: Atraumatic, Normocephalic. Neck supple. No masses, No LAD. Ears and Nose: No external deformity. CV: RRR, No M/G/R. No JVD. No thrill. No extra heart sounds. PULM: CTA B, no wheezes, crackles, rhonchi. No retractions. No resp. distress. No accessory muscle use. EXTR: No c/c/e NEURO Normal gait.  PSYCH: Normally interactive. Conversant. Not depressed or anxious appearing.  Calm demeanor.    Assessment and Plan: Encounter for CDL (commercial driving license) exam  Cleared for driving today per Dr. Thurston Hole notes and pt's  imrpoved sx.  1 year card due to DM.  Retested his vision today and he did pass.   Signed Abbe Amsterdam, MD

## 2013-02-23 NOTE — Assessment & Plan Note (Addendum)
-   spirometry 02/12/13  FEV1 0.93 (34%) ratio 32   - hfa 25% 02/20/2013   - Try breo 02/20/2013   The proper method of use, as well as anticipated side effects, of a metered-dose inhaler are discussed and demonstrated to the patient. Improved effectiveness after extensive coaching during this visit to a level of approximately  25% so really not a good candidate for hfa and not using advair consistently - will try breo on puff daily then return for f/u in 6 weeks when his samples will run out    Each maintenance medication was reviewed in detail including most importantly the difference between maintenance and as needed and under what circumstances the prns are to be used.  Please see instructions for details which were reviewed in writing and the patient given a copy.    I see no restriction on driving based on level of FEV1 reduction in his DOT paperwork and his symptoms are relatively moderate, occuring only with exertion, without tendency to aecopd, so ok to drive and may be most cost effective to use spiriva or tudorza in this pt who would be a GROUP C under the new GOLD guidelines

## 2013-02-26 ENCOUNTER — Encounter: Payer: Self-pay | Admitting: Family Medicine

## 2013-04-03 ENCOUNTER — Ambulatory Visit (INDEPENDENT_AMBULATORY_CARE_PROVIDER_SITE_OTHER): Payer: Medicare PPO | Admitting: Internal Medicine

## 2013-04-03 ENCOUNTER — Encounter: Payer: Self-pay | Admitting: Internal Medicine

## 2013-04-03 VITALS — BP 130/76 | HR 78 | Temp 98.8°F | Ht 68.0 in | Wt 252.0 lb

## 2013-04-03 DIAGNOSIS — R222 Localized swelling, mass and lump, trunk: Secondary | ICD-10-CM

## 2013-04-03 DIAGNOSIS — J449 Chronic obstructive pulmonary disease, unspecified: Secondary | ICD-10-CM

## 2013-04-03 LAB — PULMONARY FUNCTION TEST

## 2013-04-03 MED ORDER — FLUTICASONE FUROATE-VILANTEROL 100-25 MCG/INH IN AEPB: 1.0000 | INHALATION_SPRAY | Freq: Every morning | RESPIRATORY_TRACT | Status: DC

## 2013-04-03 NOTE — Progress Notes (Signed)
PFT done today. 

## 2013-04-03 NOTE — Patient Instructions (Addendum)
Continue Breo one daily  If your insurance formulary restricts the use of Breo, ok to change back to advair 250 / 50 one twice daily but if not satisfied then return with a copy of your formulary in hand and we'll pick another one for you to try   If you are satisfied with your treatment plan let your doctor know and he/she can either refill your medications or you can return here when your prescription runs out.     If in any way you are not 100% satisfied,  please tell us.  If 100% better, tell your friends!

## 2013-04-03 NOTE — Progress Notes (Signed)
  Subjective:    Patient ID: Jesse Garrison, male    DOB: [birth year]  MRN: [id number]   Brief patient profile:  59 yobm quit 2012 p disability for lungs in 2009 with wt about same with doe x exertion referred to pulmonary clinic 02/20/13  by Dr Leitha Schuller for abn pft's and DOT concerns.  HPI 02/20/2013 1st pulmonary eval / Jesse Garrison cc  Indolent onset progressive x 10 y to point where doe x 100 ft at nl pace, no sob at rest, not sob at hs some better on alb twice daily  but not on advair ( not taking regularly) . Since stopped smoking symptoms have leveled off. rec Start Breo one puff each am Stop advair  Only use your albuterol as a rescue medication    04/03/2013 f/u ov/Jesse Garrison re COPD GOLD II Chief Complaint  Patient presents with  . COPD    Breathing is improved. Denies SOB, chest tightness/pain, coughing or wheezing.   not limited by breathing at this point from desired activities    No obvious daytime variabilty or assoc chronic cough or cp or chest tightness, subjective wheeze overt sinus or hb symptoms. No unusual exp hx or h/o childhood pna/ asthma or knowledge of premature birth.   Sleeping ok without hypersomnolence and not aware of nocturnal  or early am exacerbation  of respiratory  c/o's or need for noct saba. Also denies any obvious fluctuation of symptoms with weather or environmental changes or other aggravating or alleviating factors except as outlined above   Current Medications, Allergies, Past Medical History, Past Surgical History, Family History, and Social History were reviewed in [employer] record.  ROS  The following are not active complaints unless bolded sore throat, dysphagia, dental problems, itching, sneezing,  nasal congestion or excess/ purulent secretions, ear ache,   fever, chills, sweats, unintended wt loss, pleuritic or exertional cp, hemoptysis,  orthopnea pnd or leg swelling, presyncope, palpitations, heartburn, abdominal pain, anorexia,  nausea, vomiting, diarrhea  or change in bowel or urinary habits, change in stools or urine, dysuria,hematuria,  rash, arthralgias, visual complaints, headache, numbness weakness or ataxia or problems with walking or coordination,  change in mood/affect or memory.            Objective:   Physical Exam   amb slt hoarse wm nad 04/03/2013       252  Wt Readings from Last 3 Encounters:  02/20/13 253 lb 6.4 oz (114.941 kg)  02/16/13 257 lb (116.574 kg)  02/12/13 250 lb (113.399 kg)     HEENT mild turbinate edema.  Oropharynx top dentures no thrush or excess pnd or cobblestoning.  No JVD or cervical adenopathy. Mild accessory muscle hypertrophy. Trachea midline, nl thryroid. Chest was hyperinflated by percussion with diminished breath sounds and moderate increased exp time without wheeze. Hoover sign positive at mid inspiration. Regular rate and rhythm without murmur gallop or rub or increase P2 or edema.  Abd: no hsm, nl excursion. Ext warm without cyanosis or clubbing.   No recent cxr on file      Assessment & Plan:

## 2013-04-05 NOTE — Assessment & Plan Note (Signed)
Discussed need to keep appts at [facility]

## 2013-04-05 NOTE — Assessment & Plan Note (Signed)
-   spirometry 02/12/13  FEV1 0.93 (34%) ratio 32 - PFT's 04/03/2013   FEV1 1.42 (52%) raio 49 and no change p B2,  DLCO 44 and corrects 58%   - hfa 25% 02/20/2013   - Try breo 02/20/2013   Not really able to do hfa but doing great on breo so should continue and if insurance not covering ok for advair equivalent = 250/50 bid    Each maintenance medication was reviewed in detail including most importantly the difference between maintenance and as needed and under what circumstances the prns are to be used.  Please see instructions for details which were reviewed in writing and the patient given a copy.

## 2013-04-14 ENCOUNTER — Encounter: Payer: Self-pay | Admitting: Internal Medicine

## 2013-04-17 ENCOUNTER — Ambulatory Visit (AMBULATORY_SURGERY_CENTER): Payer: Commercial Managed Care - HMO | Admitting: *Deleted

## 2013-04-17 VITALS — Ht 69.0 in | Wt 252.0 lb

## 2013-04-17 DIAGNOSIS — Z1211 Encounter for screening for malignant neoplasm of colon: Secondary | ICD-10-CM

## 2013-04-17 NOTE — Progress Notes (Signed)
Denies allergies to eggs or soy products. Denies complications with sedation or anesthesia. 

## 2013-04-20 ENCOUNTER — Encounter: Payer: Self-pay | Admitting: Internal Medicine

## 2013-05-01 ENCOUNTER — Ambulatory Visit (AMBULATORY_SURGERY_CENTER): Payer: Medicare PPO | Admitting: Internal Medicine

## 2013-05-01 ENCOUNTER — Encounter: Payer: Self-pay | Admitting: Internal Medicine

## 2013-05-01 VITALS — BP 142/77 | HR 74 | Temp 98.7°F | Resp 19 | Ht 69.0 in | Wt 252.0 lb

## 2013-05-01 DIAGNOSIS — D126 Benign neoplasm of colon, unspecified: Secondary | ICD-10-CM

## 2013-05-01 DIAGNOSIS — Z1211 Encounter for screening for malignant neoplasm of colon: Secondary | ICD-10-CM

## 2013-05-01 DIAGNOSIS — K5730 Diverticulosis of large intestine without perforation or abscess without bleeding: Secondary | ICD-10-CM

## 2013-05-01 MED ORDER — SODIUM CHLORIDE 0.9 % IV SOLN: 500.0000 mL | INTRAVENOUS | Status: DC

## 2013-05-01 NOTE — Op Note (Signed)
[facility] [address] [place], [address]   COLONOSCOPY PROCEDURE REPORT  PATIENT: Jesse Garrison, Jesse Garrison  MR#: [id number] BIRTHDATE: Oct 28, 1945 , 66  yrs. old GENDER: Male ENDOSCOPIST: Iva Boop, MD, [facility] REFERRED AO:[id number] Karsten Ro, M.D. PROCEDURE DATE:  05/01/2013 PROCEDURE:   Colonoscopy with snare polypectomy First Screening Colonoscopy - Avg.  risk and is 50 yrs.  old or older Yes.  Prior Negative Screening - Now for repeat screening. N/A  History of Adenoma - Now for follow-up colonoscopy & has been > or = to 3 yrs.  N/A  Polyps Removed Today? Yes. ASA CLASS:   Class II INDICATIONS:average risk screening and first colonoscopy. MEDICATIONS: Propofol (Diprivan) 330 mg IV  DESCRIPTION OF PROCEDURE:   After the risks benefits and alternatives of the procedure were thoroughly explained, informed consent was obtained.  A digital rectal exam revealed no abnormalities of the rectum, A digital rectal exam revealed no prostatic nodules, and A digital rectal exam revealed the prostate was not enlarged.   The [id number]  endoscope was introduced through the anus and advanced to the cecum, which was identified by both the appendix and ileocecal valve. No adverse events experienced.   The quality of the prep was excellent using Suprep  The instrument was then slowly withdrawn as the colon was fully examined.    COLON FINDINGS: Three sessile polyps measuring 7-10 mm in size were found at the hepatic flexure, in the transverse colon, and descending colon.  A polypectomy was performed with a cold snare and using snare cautery.  The resection was complete and the polyp tissue was completely retrieved.   Severe diverticulosis was noted The finding was in the left colon.   The colon mucosa was otherwise normal.   A right colon retroflexion was performed.  Retroflexed views revealed no abnormalities. The time to cecum=1 minutes 45 seconds.  Withdrawal time=13  minutes 03 seconds.  The scope was withdrawn and the procedure completed. COMPLICATIONS: There were no complications.  ENDOSCOPIC IMPRESSION: 1.   Three sessile polyps measuring 7-10 mm in size were found at the hepatic flexure, in the transverse colon, and descending colon; polypectomy was performed with a cold snare and using snare cautery  2.   Severe diverticulosis was noted in the left colon 3.   The colon mucosa was otherwise normal  RECOMMENDATIONS: 1.  Hold aspirin, aspirin products, and anti-inflammatory medication for 2 weeks. 2.  Timing of repeat colonoscopy will be determined by pathology findings. 3.   Routine annual hemoccults not indicated.  eSigned:  Iva Boop, MD, [facility] 05/01/2013 9:28 AM  cc: Etta Grandchild, MD and The Patient

## 2013-05-01 NOTE — Progress Notes (Signed)
Procedure ends, to recovery, report given and VSS. 

## 2013-05-01 NOTE — Progress Notes (Signed)
Called to room to assist during endoscopic procedure.  Patient ID and intended procedure confirmed with present staff. Received instructions for my participation in the procedure from the performing physician.  

## 2013-05-01 NOTE — Patient Instructions (Addendum)
I found and removed 3 polyps - they all look benign. You also have a condition called diverticulosis - common and not usually a problem. Please read the handout provided.  I will let you know pathology results and when to have another routine colonoscopy by mail.   I appreciate the opportunity to care for you. Iva Boop, MD, FACG   YOU HAD AN ENDOSCOPIC PROCEDURE TODAY AT THE [facility]: Refer to the procedure report that was given to you for any specific questions about what was found during the examination.  If the procedure report does not answer your questions, please call your gastroenterologist to clarify.  If you requested that your care partner not be given the details of your procedure findings, then the procedure report has been included in a sealed envelope for you to review at your convenience later.  YOU SHOULD EXPECT: Some feelings of bloating in the abdomen. Passage of more gas than usual.  Walking can help get rid of the air that was put into your GI tract during the procedure and reduce the bloating. If you had a lower endoscopy (such as a colonoscopy or flexible sigmoidoscopy) you may notice spotting of blood in your stool or on the toilet paper. If you underwent a bowel prep for your procedure, then you may not have a normal bowel movement for a few days.  DIET: Your first meal following the procedure should be a light meal and then it is ok to progress to your normal diet.  A half-sandwich or bowl of soup is an example of a good first meal.  Heavy or fried foods are harder to digest and may make you feel nauseous or bloated.  Likewise meals heavy in dairy and vegetables can cause extra gas to form and this can also increase the bloating.  Drink plenty of fluids but you should avoid alcoholic beverages for 24 hours.  ACTIVITY: Your care partner should take you home directly after the procedure.  You should plan to take it easy, moving slowly for the rest of the  day.  You can resume normal activity the day after the procedure however you should NOT DRIVE or use heavy machinery for 24 hours (because of the sedation medicines used during the test).    SYMPTOMS TO REPORT IMMEDIATELY: A gastroenterologist can be reached at any hour.  During normal business hours, 8:30 AM to 5:00 PM Monday through Friday, call [phone].  After hours and on weekends, please call the GI answering service at [phone] who will take a message and have the physician on call contact you.   Following lower endoscopy (colonoscopy or flexible sigmoidoscopy):  Excessive amounts of blood in the stool  Significant tenderness or worsening of abdominal pains  Swelling of the abdomen that is new, acute  Fever of 100F or higher    FOLLOW UP: If any biopsies were taken you will be contacted by phone or by letter within the next 1-3 weeks.  Call your gastroenterologist if you have not heard about the biopsies in 3 weeks.  Our staff will call the home number listed on your records the next business day following your procedure to check on you and address any questions or concerns that you may have at that time regarding the information given to you following your procedure. This is a courtesy call and so if there is no answer at the home number and we have not heard from you through the emergency physician  on call, we will assume that you have returned to your regular daily activities without incident.  SIGNATURES/CONFIDENTIALITY: You and/or your care partner have signed paperwork which will be entered into your electronic medical record.  These signatures attest to the fact that that the information above on your After Visit Summary has been reviewed and is understood.  Full responsibility of the confidentiality of this discharge information lies with you and/or your care-partner.    Information on polyps,diverticulosis & high fiber diet given to you today  NO ASPIRIN OR ANTI  INFLAMMATORY PRODUCTS FOR 2 WEEKS PER DR Leone Payor

## 2013-05-01 NOTE — Progress Notes (Signed)
Patient did not have preoperative order for IV antibiotic SSI prophylaxis. (G8918)  Patient did not experience any of the following events: a burn prior to discharge; a fall within the facility; wrong site/side/patient/procedure/implant event; or a hospital transfer or hospital admission upon discharge from the facility. (G8907)  

## 2013-05-04 ENCOUNTER — Telehealth: Payer: Self-pay | Admitting: *Deleted

## 2013-05-04 NOTE — Telephone Encounter (Signed)
  Follow up Call-  Call back number 05/01/2013  Post procedure Call Back phone  # [phone] or [phone] (leave message on this one)  Permission to leave phone message Yes     Patient questions:  Do you have a fever, pain , or abdominal swelling? no Pain Score  0 *  Have you tolerated food without any problems? yes  Have you been able to return to your normal activities? yes  Do you have any questions about your discharge instructions: Diet   no Medications  no Follow up visit  no  Do you have questions or concerns about your Care? no  Actions: * If pain score is 4 or above: No action needed, pain <4.

## 2013-05-08 ENCOUNTER — Encounter: Payer: Self-pay | Admitting: Internal Medicine

## 2013-05-08 DIAGNOSIS — Z860100 Personal history of colon polyps, unspecified: Secondary | ICD-10-CM

## 2013-05-08 HISTORY — DX: Personal history of colon polyps, unspecified: Z86.0100

## 2013-05-08 HISTORY — DX: Personal history of colonic polyps: Z86.010

## 2013-05-08 NOTE — Progress Notes (Signed)
Quick Note:  2 sessile serrated adenomas and one tubular adenoma max 10 mm Repeat colon 2017 ______

## 2013-05-11 LAB — HM COLONOSCOPY

## 2013-06-03 ENCOUNTER — Other Ambulatory Visit (INDEPENDENT_AMBULATORY_CARE_PROVIDER_SITE_OTHER): Payer: Medicare PPO

## 2013-06-03 ENCOUNTER — Ambulatory Visit (INDEPENDENT_AMBULATORY_CARE_PROVIDER_SITE_OTHER): Payer: Medicare PPO | Admitting: Internal Medicine

## 2013-06-03 VITALS — BP 140/66 | HR 80 | Temp 98.2°F | Resp 16 | Ht 71.0 in | Wt 245.0 lb

## 2013-06-03 DIAGNOSIS — M1990 Unspecified osteoarthritis, unspecified site: Secondary | ICD-10-CM

## 2013-06-03 DIAGNOSIS — I10 Essential (primary) hypertension: Secondary | ICD-10-CM

## 2013-06-03 DIAGNOSIS — J449 Chronic obstructive pulmonary disease, unspecified: Secondary | ICD-10-CM

## 2013-06-03 DIAGNOSIS — IMO0001 Reserved for inherently not codable concepts without codable children: Secondary | ICD-10-CM

## 2013-06-03 LAB — BASIC METABOLIC PANEL
Calcium: 9.5 mg/dL (ref 8.4–10.5)
GFR: 65.89 mL/min (ref >60.00)
Glucose, Bld: 166 mg/dL — ABNORMAL HIGH (ref 70–99)
Sodium: 139 mEq/L (ref 135–145)

## 2013-06-03 LAB — HEMOGLOBIN A1C: Hgb A1c MFr Bld: 6.9 % — ABNORMAL HIGH (ref 4.6–6.5)

## 2013-06-03 MED ORDER — SITAGLIP PHOS-METFORMIN HCL ER 50-1000 MG PO TB24: 1.0000 | ORAL_TABLET | Freq: Every day | ORAL | Status: DC

## 2013-06-03 MED ORDER — ROFLUMILAST 500 MCG PO TABS: 500.0000 ug | ORAL_TABLET | Freq: Every day | ORAL | Status: DC

## 2013-06-03 NOTE — Progress Notes (Signed)
Subjective:    Patient ID: Jesse Garrison, male    DOB: 09/21/1945, 67 y.o.   MRN: [id number]  Arthritis Presents for follow-up visit. The disease course has been fluctuating. The condition has lasted for 2 years. He complains of pain. He reports no stiffness, joint swelling or joint warmth. Affected locations include the right wrist, left wrist, right elbow and left elbow. His pain is at a severity of 2/10. Pertinent negatives include no diarrhea, dry eyes, dry mouth, dysuria, fatigue, fever, pain at night, pain while resting, rash, Raynaud's syndrome, uveitis or weight loss. His past medical history is significant for osteoarthritis. His pertinent risk factors include overuse. Past treatments include acetaminophen. The treatment provided mild relief. Factors aggravating his arthritis include activity. Compliance with prior treatments has been good.      Review of Systems  Constitutional: Negative.  Negative for fever, chills, weight loss, diaphoresis, appetite change and fatigue.  HENT: Negative.   Eyes: Negative.   Respiratory: Positive for shortness of breath (chronic,unchanged). Negative for apnea, cough, choking, chest tightness, wheezing and stridor.   Cardiovascular: Negative.  Negative for chest pain, palpitations and leg swelling.  Gastrointestinal: Negative.  Negative for nausea, vomiting, abdominal pain, diarrhea, constipation and blood in stool.  Endocrine: Negative.   Genitourinary: Negative.  Negative for dysuria.  Musculoskeletal: Positive for arthralgias and arthritis. Negative for back pain, gait problem, joint swelling, myalgias, neck pain and stiffness.  Skin: Negative.  Negative for rash.  Allergic/Immunologic: Negative.   Neurological: Negative.  Negative for dizziness, tremors, syncope, speech difficulty, weakness and light-headedness.  Hematological: Negative.  Negative for adenopathy. Does not bruise/bleed easily.  Psychiatric/Behavioral: Negative.        Objective:    Physical Exam  Vitals reviewed. Constitutional: He is oriented to person, place, and time. He appears well-developed and well-nourished. No distress.  HENT:  Head: Normocephalic and atraumatic.  Mouth/Throat: Oropharynx is clear and moist. No oropharyngeal exudate.  Eyes: Conjunctivae are normal. Right eye exhibits no discharge. Left eye exhibits no discharge. No scleral icterus.  Neck: Normal range of motion. Neck supple. No JVD present. No tracheal deviation present. No thyromegaly present.  Cardiovascular: Normal rate, regular rhythm, normal heart sounds and intact distal pulses.  Exam reveals no gallop and no friction rub.   No murmur heard. Pulmonary/Chest: Effort normal and breath sounds normal. No stridor. No respiratory distress. He has no wheezes. He has no rales. He exhibits no tenderness.  Abdominal: Soft. Bowel sounds are normal. He exhibits no distension and no mass. There is no tenderness. There is no rebound and no guarding.  Musculoskeletal: Normal range of motion. He exhibits no edema and no tenderness.  Lymphadenopathy:    He has no cervical adenopathy.  Neurological: He is oriented to person, place, and time.  Skin: Skin is warm and dry. No rash noted. He is not diaphoretic. No erythema. No pallor.  Psychiatric: He has a normal mood and affect. His behavior is normal. Judgment and thought content normal.      Lab Results  Component Value Date   WBC 11.2* 02/16/2013   HGB 14.0 02/16/2013   HCT 42.6 02/16/2013   PLT 164.0 02/16/2013   GLUCOSE 183* 02/16/2013   CHOL 195 02/16/2013   TRIG 90.0 02/16/2013   HDL 41.00 02/16/2013   LDLCALC 136* 02/16/2013   ALT 20 02/16/2013   AST 22 02/16/2013   NA 139 02/16/2013   K 4.3 02/16/2013   CL 105 02/16/2013   CREATININE 1.2 02/16/2013  BUN 16 02/16/2013   CO2 27 02/16/2013   TSH 0.55 02/16/2013   PSA 3.81 02/16/2013   HGBA1C 7.7* 02/16/2013   MICROALBUR 1.2 02/16/2013      Assessment & Plan:

## 2013-06-03 NOTE — Patient Instructions (Signed)
Type 2 Diabetes Mellitus, Adult Type 2 diabetes mellitus, often simply referred to as type 2 diabetes, is a long-lasting (chronic) disease. In type 2 diabetes, the pancreas does not make enough insulin (a hormone), the cells are less responsive to the insulin that is made (insulin resistance), or both. Normally, insulin moves sugars from food into the tissue cells. The tissue cells use the sugars for energy. The lack of insulin or the lack of normal response to insulin causes excess sugars to build up in the blood instead of going into the tissue cells. As a result, high blood sugar (hyperglycemia) develops. The effect of high sugar (glucose) levels can cause many complications. Type 2 diabetes was also previously called adult-onset diabetes but it can occur at any age.  RISK FACTORS  A person is predisposed to developing type 2 diabetes if someone in the family has the disease and also has one or more of the following primary risk factors:  Overweight.  An inactive lifestyle.  A history of consistently eating high-calorie foods. Maintaining a normal weight and regular physical activity can reduce the chance of developing type 2 diabetes. SYMPTOMS  A person with type 2 diabetes may not show symptoms initially. The symptoms of type 2 diabetes appear slowly. The symptoms include:  Increased thirst (polydipsia).  Increased urination (polyuria).  Increased urination during the night (nocturia).  Weight loss. This weight loss may be rapid.  Frequent, recurring infections.  Tiredness (fatigue).  Weakness.  Vision changes, such as blurred vision.  Fruity smell to your breath.  Abdominal pain.  Nausea or vomiting.  Cuts or bruises which are slow to heal.  Tingling or numbness in the hands or feet. DIAGNOSIS Type 2 diabetes is frequently not diagnosed until complications of diabetes are present. Type 2 diabetes is diagnosed when symptoms or complications are present and when blood  glucose levels are increased. Your blood glucose level may be checked by one or more of the following blood tests:  A fasting blood glucose test. You will not be allowed to eat for at least 8 hours before a blood sample is taken.  A random blood glucose test. Your blood glucose is checked at any time of the day regardless of when you ate.  A hemoglobin A1c blood glucose test. A hemoglobin A1c test provides information about blood glucose control over the previous 3 months.  An oral glucose tolerance test (OGTT). Your blood glucose is measured after you have not eaten (fasted) for 2 hours and then after you drink a glucose-containing beverage. TREATMENT   You may need to take insulin or diabetes medicine daily to keep blood glucose levels in the desired range.  You will need to match insulin dosing with exercise and healthy food choices. The treatment goal is to maintain the before meal blood sugar (preprandial glucose) level at 70 130 mg/dL. HOME CARE INSTRUCTIONS   Have your hemoglobin A1c level checked twice a year.  Perform daily blood glucose monitoring as directed by your caregiver.  Monitor urine ketones when you are ill and as directed by your caregiver.  Take your diabetes medicine or insulin as directed by your caregiver to maintain your blood glucose levels in the desired range.  Never run out of diabetes medicine or insulin. It is needed every day.  Adjust insulin based on your intake of carbohydrates. Carbohydrates can raise blood glucose levels but need to be included in your diet. Carbohydrates provide vitamins, minerals, and fiber which are an essential part of   a healthy diet. Carbohydrates are found in fruits, vegetables, whole grains, dairy products, legumes, and foods containing added sugars.    Eat healthy foods. Alternate 3 meals with 3 snacks.  Lose weight if overweight.  Carry a medical alert card or wear your medical alert jewelry.  Carry a 15 gram  carbohydrate snack with you at all times to treat low blood glucose (hypoglycemia). Some examples of 15 gram carbohydrate snacks include:  Glucose tablets, 3 or 4   Glucose gel, 15 gram tube  Raisins, 2 tablespoons (24 grams)  Jelly beans, 6  Animal crackers, 8  Regular pop, 4 ounces (120 mL)  Gummy treats, 9  Recognize hypoglycemia. Hypoglycemia occurs with blood glucose levels of 70 mg/dL and below. The risk for hypoglycemia increases when fasting or skipping meals, during or after intense exercise, and during sleep. Hypoglycemia symptoms can include:  Tremors or shakes.  Decreased ability to concentrate.  Sweating.  Increased heart rate.  Headache.  Dry mouth.  Hunger.  Irritability.  Anxiety.  Restless sleep.  Altered speech or coordination.  Confusion.  Treat hypoglycemia promptly. If you are alert and able to safely swallow, follow the 15:15 rule:  Take 15 20 grams of rapid-acting glucose or carbohydrate. Rapid-acting options include glucose gel, glucose tablets, or 4 ounces (120 mL) of fruit juice, regular soda, or low fat milk.  Check your blood glucose level 15 minutes after taking the glucose.  Take 15 20 grams more of glucose if the repeat blood glucose level is still 70 mg/dL or below.  Eat a meal or snack within 1 hour once blood glucose levels return to normal.    Be alert to polyuria and polydipsia which are early signs of hyperglycemia. An early awareness of hyperglycemia allows for prompt treatment. Treat hyperglycemia as directed by your caregiver.  Engage in at least 150 minutes of moderate-intensity physical activity a week, spread over at least 3 days of the week or as directed by your caregiver. In addition, you should engage in resistance exercise at least 2 times a week or as directed by your caregiver.  Adjust your medicine and food intake as needed if you start a new exercise or sport.  Follow your sick day plan at any time you  are unable to eat or drink as usual.  Avoid tobacco use.  Limit alcohol intake to no more than 1 drink per day for nonpregnant women and 2 drinks per day for men. You should drink alcohol only when you are also eating food. Talk with your caregiver whether alcohol is safe for you. Tell your caregiver if you drink alcohol several times a week.  Follow up with your caregiver regularly.  Schedule an eye exam soon after the diagnosis of type 2 diabetes and then annually.  Perform daily skin and foot care. Examine your skin and feet daily for cuts, bruises, redness, nail problems, bleeding, blisters, or sores. A foot exam by a caregiver should be done annually.  Brush your teeth and gums at least twice a day and floss at least once a day. Follow up with your dentist regularly.  Share your diabetes management plan with your workplace or school.  Stay up-to-date with immunizations.  Learn to manage stress.  Obtain ongoing diabetes education and support as needed.  Participate in, or seek rehabilitation as needed to maintain or improve independence and quality of life. Request a physical or occupational therapy referral if you are having foot or hand numbness or difficulties with grooming,   dressing, eating, or physical activity. SEEK MEDICAL CARE IF:   You are unable to eat food or drink fluids for more than 6 hours.  You have nausea and vomiting for more than 6 hours.  Your blood glucose level is over 240 mg/dL.  There is a change in mental status.  You develop an additional serious illness.  You have diarrhea for more than 6 hours.  You have been sick or have had a fever for a couple of days and are not getting better.  You have pain during any physical activity.  SEEK IMMEDIATE MEDICAL CARE IF:  You have difficulty breathing.  You have moderate to large ketone levels. MAKE SURE YOU:  Understand these instructions.  Will watch your condition.  Will get help right away if  you are not doing well or get worse. Document Released: 07/30/2005 Document Revised: 04/23/2012 Document Reviewed: 02/26/2012 [employer] Patient Information 2014 [employer].  

## 2013-06-04 ENCOUNTER — Encounter: Payer: Self-pay | Admitting: Internal Medicine

## 2013-06-04 MED ORDER — DICLOFENAC SODIUM 1 % TD GEL: 2.0000 g | Freq: Four times a day (QID) | TRANSDERMAL | Status: DC

## 2013-06-07 ENCOUNTER — Encounter: Payer: Self-pay | Admitting: Internal Medicine

## 2013-06-07 NOTE — Assessment & Plan Note (Signed)
His BP is well controlled 

## 2013-06-07 NOTE — Assessment & Plan Note (Signed)
He will start using voltaren gel

## 2013-06-07 NOTE — Assessment & Plan Note (Signed)
His A1C shows good control of the blood sugars 

## 2013-10-15 ENCOUNTER — Ambulatory Visit: Payer: Medicare HMO | Admitting: Internal Medicine

## 2013-10-16 ENCOUNTER — Telehealth: Payer: Self-pay | Admitting: Internal Medicine

## 2013-10-16 DIAGNOSIS — E785 Hyperlipidemia, unspecified: Secondary | ICD-10-CM

## 2013-10-16 DIAGNOSIS — I10 Essential (primary) hypertension: Secondary | ICD-10-CM

## 2013-10-16 DIAGNOSIS — IMO0001 Reserved for inherently not codable concepts without codable children: Secondary | ICD-10-CM

## 2013-10-16 DIAGNOSIS — J449 Chronic obstructive pulmonary disease, unspecified: Secondary | ICD-10-CM

## 2013-10-16 DIAGNOSIS — E1165 Type 2 diabetes mellitus with hyperglycemia: Principal | ICD-10-CM

## 2013-10-16 NOTE — Telephone Encounter (Signed)
Patient is requesting samples of proventil 108 (90 base) mcg/act, azor 5-4 mg, fluticasone 100-25 mcg/inh, pitavastatin calcium 4mg , daliresp 500 mcg, sitagliptin-metformin 50-1000 mg, and hytrin.

## 2013-10-19 MED ORDER — SITAGLIP PHOS-METFORMIN HCL ER 50-1000 MG PO TB24: 1.0000 | ORAL_TABLET | Freq: Every day | ORAL | Status: DC

## 2013-10-19 MED ORDER — PITAVASTATIN CALCIUM 4 MG PO TABS: 1.0000 | ORAL_TABLET | Freq: Every day | ORAL | Status: DC

## 2013-10-19 MED ORDER — AMLODIPINE-OLMESARTAN 5-40 MG PO TABS: 1.0000 | ORAL_TABLET | Freq: Every day | ORAL | Status: DC

## 2013-10-19 MED ORDER — ROFLUMILAST 500 MCG PO TABS
500.0000 ug | ORAL_TABLET | Freq: Every day | ORAL | Status: DC
Start: 2013-10-19 — End: 2015-08-04

## 2013-10-19 NOTE — Telephone Encounter (Signed)
Patient notified

## 2013-11-09 ENCOUNTER — Telehealth: Payer: Self-pay

## 2013-11-09 NOTE — Telephone Encounter (Signed)
Received imaging CD but unable to read. I called pt to advise that we are not able to read CD and will need the actual report from wherever the imaging was done or sign a release in order for our office to request. Pt gave verbal understanding and states that he will try to obtain, appt 11/16/13.

## 2013-11-16 ENCOUNTER — Telehealth: Payer: Self-pay | Admitting: *Deleted

## 2013-11-16 ENCOUNTER — Ambulatory Visit (INDEPENDENT_AMBULATORY_CARE_PROVIDER_SITE_OTHER)
Admission: RE | Admit: 2013-11-16 | Discharge: 2013-11-16 | Disposition: A | Discharge status: Home / Self Care | Payer: Medicare HMO | Source: Ambulatory Visit | Attending: Internal Medicine | Admitting: Internal Medicine

## 2013-11-16 ENCOUNTER — Other Ambulatory Visit (INDEPENDENT_AMBULATORY_CARE_PROVIDER_SITE_OTHER): Payer: Medicare HMO

## 2013-11-16 ENCOUNTER — Encounter: Payer: Self-pay | Admitting: Internal Medicine

## 2013-11-16 ENCOUNTER — Ambulatory Visit (INDEPENDENT_AMBULATORY_CARE_PROVIDER_SITE_OTHER): Payer: Medicare HMO | Admitting: Internal Medicine

## 2013-11-16 VITALS — BP 140/78 | HR 85 | Temp 97.8°F | Resp 16 | Ht 69.0 in | Wt 243.0 lb

## 2013-11-16 DIAGNOSIS — R222 Localized swelling, mass and lump, trunk: Secondary | ICD-10-CM

## 2013-11-16 DIAGNOSIS — E1165 Type 2 diabetes mellitus with hyperglycemia: Principal | ICD-10-CM

## 2013-11-16 DIAGNOSIS — R918 Other nonspecific abnormal finding of lung field: Secondary | ICD-10-CM

## 2013-11-16 DIAGNOSIS — IMO0001 Reserved for inherently not codable concepts without codable children: Secondary | ICD-10-CM

## 2013-11-16 DIAGNOSIS — E785 Hyperlipidemia, unspecified: Secondary | ICD-10-CM

## 2013-11-16 DIAGNOSIS — I10 Essential (primary) hypertension: Secondary | ICD-10-CM

## 2013-11-16 DIAGNOSIS — R911 Solitary pulmonary nodule: Secondary | ICD-10-CM

## 2013-11-16 LAB — BASIC METABOLIC PANEL
BUN: 17 mg/dL (ref 6–23)
CALCIUM: 9.7 mg/dL (ref 8.4–10.5)
CO2: 24 mEq/L (ref 19–32)
Chloride: 106 mEq/L (ref 96–112)
Creatinine, Ser: 1.1 mg/dL (ref 0.4–1.5)
GFR: 83.72 mL/min (ref >60.00)
GLUCOSE: 78 mg/dL (ref 70–99)
POTASSIUM: 4.3 meq/L (ref 3.5–5.1)
SODIUM: 140 meq/L (ref 135–145)

## 2013-11-16 LAB — LIPID PANEL
Cholesterol: 122 mg/dL (ref 0–200)
HDL: 40.4 mg/dL (ref >39.00)
LDL CALC: 68 mg/dL (ref 0–99)
Total CHOL/HDL Ratio: 3
Triglycerides: 68 mg/dL (ref 0.0–149.0)
VLDL: 13.6 mg/dL (ref 0.0–40.0)

## 2013-11-16 LAB — HEMOGLOBIN A1C: Hgb A1c MFr Bld: 6.8 % — ABNORMAL HIGH (ref 4.6–6.5)

## 2013-11-16 LAB — TSH: TSH: 0.76 u[IU]/mL (ref 0.35–5.50)

## 2013-11-16 NOTE — Patient Instructions (Signed)
Type 2 Diabetes Mellitus, Adult Type 2 diabetes mellitus, often simply referred to as type 2 diabetes, is a long-lasting (chronic) disease. In type 2 diabetes, the pancreas does not make enough insulin (a hormone), the cells are less responsive to the insulin that is made (insulin resistance), or both. Normally, insulin moves sugars from food into the tissue cells. The tissue cells use the sugars for energy. The lack of insulin or the lack of normal response to insulin causes excess sugars to build up in the blood instead of going into the tissue cells. As a result, high blood sugar (hyperglycemia) develops. The effect of high sugar (glucose) levels can cause many complications. Type 2 diabetes was also previously called adult-onset diabetes but it can occur at any age.  RISK FACTORS  A person is predisposed to developing type 2 diabetes if someone in the family has the disease and also has one or more of the following primary risk factors:  Overweight.  An inactive lifestyle.  A history of consistently eating high-calorie foods. Maintaining a normal weight and regular physical activity can reduce the chance of developing type 2 diabetes. SYMPTOMS  A person with type 2 diabetes may not show symptoms initially. The symptoms of type 2 diabetes appear slowly. The symptoms include:  Increased thirst (polydipsia).  Increased urination (polyuria).  Increased urination during the night (nocturia).  Weight loss. This weight loss may be rapid.  Frequent, recurring infections.  Tiredness (fatigue).  Weakness.  Vision changes, such as blurred vision.  Fruity smell to your breath.  Abdominal pain.  Nausea or vomiting.  Cuts or bruises which are slow to heal.  Tingling or numbness in the hands or feet. DIAGNOSIS Type 2 diabetes is frequently not diagnosed until complications of diabetes are present. Type 2 diabetes is diagnosed when symptoms or complications are present and when blood  glucose levels are increased. Your blood glucose level may be checked by one or more of the following blood tests:  A fasting blood glucose test. You will not be allowed to eat for at least 8 hours before a blood sample is taken.  A random blood glucose test. Your blood glucose is checked at any time of the day regardless of when you ate.  A hemoglobin A1c blood glucose test. A hemoglobin A1c test provides information about blood glucose control over the previous 3 months.  An oral glucose tolerance test (OGTT). Your blood glucose is measured after you have not eaten (fasted) for 2 hours and then after you drink a glucose-containing beverage. TREATMENT   You may need to take insulin or diabetes medicine daily to keep blood glucose levels in the desired range.  You will need to match insulin dosing with exercise and healthy food choices. The treatment goal is to maintain the before meal blood sugar (preprandial glucose) level at 70 130 mg/dL. HOME CARE INSTRUCTIONS   Have your hemoglobin A1c level checked twice a year.  Perform daily blood glucose monitoring as directed by your caregiver.  Monitor urine ketones when you are ill and as directed by your caregiver.  Take your diabetes medicine or insulin as directed by your caregiver to maintain your blood glucose levels in the desired range.  Never run out of diabetes medicine or insulin. It is needed every day.  Adjust insulin based on your intake of carbohydrates. Carbohydrates can raise blood glucose levels but need to be included in your diet. Carbohydrates provide vitamins, minerals, and fiber which are an essential part of   a healthy diet. Carbohydrates are found in fruits, vegetables, whole grains, dairy products, legumes, and foods containing added sugars.    Eat healthy foods. Alternate 3 meals with 3 snacks.  Lose weight if overweight.  Carry a medical alert card or wear your medical alert jewelry.  Carry a 15 gram  carbohydrate snack with you at all times to treat low blood glucose (hypoglycemia). Some examples of 15 gram carbohydrate snacks include:  Glucose tablets, 3 or 4   Glucose gel, 15 gram tube  Raisins, 2 tablespoons (24 grams)  Jelly beans, 6  Animal crackers, 8  Regular pop, 4 ounces (120 mL)  Gummy treats, 9  Recognize hypoglycemia. Hypoglycemia occurs with blood glucose levels of 70 mg/dL and below. The risk for hypoglycemia increases when fasting or skipping meals, during or after intense exercise, and during sleep. Hypoglycemia symptoms can include:  Tremors or shakes.  Decreased ability to concentrate.  Sweating.  Increased heart rate.  Headache.  Dry mouth.  Hunger.  Irritability.  Anxiety.  Restless sleep.  Altered speech or coordination.  Confusion.  Treat hypoglycemia promptly. If you are alert and able to safely swallow, follow the 15:15 rule:  Take 15 20 grams of rapid-acting glucose or carbohydrate. Rapid-acting options include glucose gel, glucose tablets, or 4 ounces (120 mL) of fruit juice, regular soda, or low fat milk.  Check your blood glucose level 15 minutes after taking the glucose.  Take 15 20 grams more of glucose if the repeat blood glucose level is still 70 mg/dL or below.  Eat a meal or snack within 1 hour once blood glucose levels return to normal.    Be alert to polyuria and polydipsia which are early signs of hyperglycemia. An early awareness of hyperglycemia allows for prompt treatment. Treat hyperglycemia as directed by your caregiver.  Engage in at least 150 minutes of moderate-intensity physical activity a week, spread over at least 3 days of the week or as directed by your caregiver. In addition, you should engage in resistance exercise at least 2 times a week or as directed by your caregiver.  Adjust your medicine and food intake as needed if you start a new exercise or sport.  Follow your sick day plan at any time you  are unable to eat or drink as usual.  Avoid tobacco use.  Limit alcohol intake to no more than 1 drink per day for nonpregnant women and 2 drinks per day for men. You should drink alcohol only when you are also eating food. Talk with your caregiver whether alcohol is safe for you. Tell your caregiver if you drink alcohol several times a week.  Follow up with your caregiver regularly.  Schedule an eye exam soon after the diagnosis of type 2 diabetes and then annually.  Perform daily skin and foot care. Examine your skin and feet daily for cuts, bruises, redness, nail problems, bleeding, blisters, or sores. A foot exam by a caregiver should be done annually.  Brush your teeth and gums at least twice a day and floss at least once a day. Follow up with your dentist regularly.  Share your diabetes management plan with your workplace or school.  Stay up-to-date with immunizations.  Learn to manage stress.  Obtain ongoing diabetes education and support as needed.  Participate in, or seek rehabilitation as needed to maintain or improve independence and quality of life. Request a physical or occupational therapy referral if you are having foot or hand numbness or difficulties with grooming,   dressing, eating, or physical activity. SEEK MEDICAL CARE IF:   You are unable to eat food or drink fluids for more than 6 hours.  You have nausea and vomiting for more than 6 hours.  Your blood glucose level is over 240 mg/dL.  There is a change in mental status.  You develop an additional serious illness.  You have diarrhea for more than 6 hours.  You have been sick or have had a fever for a couple of days and are not getting better.  You have pain during any physical activity.  SEEK IMMEDIATE MEDICAL CARE IF:  You have difficulty breathing.  You have moderate to large ketone levels. MAKE SURE YOU:  Understand these instructions.  Will watch your condition.  Will get help right away if  you are not doing well or get worse. Document Released: 07/30/2005 Document Revised: 04/23/2012 Document Reviewed: 02/26/2012 [employer] Patient Information 2014 [employer].  

## 2013-11-16 NOTE — Telephone Encounter (Signed)
Please tell him that I have sent a referral to pulmonary to consider getting a biopsy done

## 2013-11-16 NOTE — Progress Notes (Signed)
Subjective:    Patient ID: Jesse Garrison, male    DOB: Feb 16, 1946, 68 y.o.   MRN: [id number]  Hypertension This is a chronic problem. The current episode started more than 1 year ago. The problem is unchanged. The problem is controlled. Pertinent negatives include no anxiety, blurred vision, chest pain, headaches, malaise/fatigue, neck pain, orthopnea, palpitations, peripheral edema, PND, shortness of breath or sweats. Agents associated with hypertension include NSAIDs. Past treatments include calcium channel blockers and angiotensin blockers. The current treatment provides moderate improvement. Compliance problems include exercise and diet.  Identifiable causes of hypertension include sleep apnea.      Review of Systems  Constitutional: Negative.  Negative for fever, chills, malaise/fatigue, diaphoresis, appetite change and fatigue.  HENT: Negative.   Eyes: Negative.  Negative for blurred vision.  Respiratory: Negative.  Negative for cough, choking, chest tightness, shortness of breath and stridor.   Cardiovascular: Negative.  Negative for chest pain, palpitations, orthopnea, leg swelling and PND.  Gastrointestinal: Negative.  Negative for nausea, vomiting, abdominal pain, diarrhea, constipation and blood in stool.  Endocrine: Negative.  Negative for polydipsia, polyphagia and polyuria.  Genitourinary: Negative.  Negative for dysuria, urgency, frequency, hematuria, flank pain and decreased urine volume.  Musculoskeletal: Negative.  Negative for arthralgias, back pain, myalgias, neck pain and neck stiffness.  Skin: Negative.   Allergic/Immunologic: Negative.   Neurological: Negative.  Negative for headaches.  Hematological: Negative.  Negative for adenopathy. Does not bruise/bleed easily.  Psychiatric/Behavioral: Negative.        Objective:   Physical Exam  Vitals reviewed. Constitutional: He is oriented to person, place, and time. He appears well-developed and well-nourished. No  distress.  HENT:  Head: Normocephalic and atraumatic.  Mouth/Throat: Oropharynx is clear and moist. No oropharyngeal exudate.  Eyes: Conjunctivae are normal. Right eye exhibits no discharge. Left eye exhibits no discharge. No scleral icterus.  Neck: Normal range of motion. Neck supple. No JVD present. No tracheal deviation present. No thyromegaly present.  Cardiovascular: Normal rate, regular rhythm, normal heart sounds and intact distal pulses.  Exam reveals no gallop and no friction rub.   No murmur heard. Pulmonary/Chest: Effort normal and breath sounds normal. No stridor. No respiratory distress. He has no wheezes. He has no rales. He exhibits no tenderness.  Abdominal: Soft. Bowel sounds are normal. He exhibits no distension and no mass. There is no tenderness. There is no rebound and no guarding.  Musculoskeletal: Normal range of motion. He exhibits no edema and no tenderness.  Lymphadenopathy:    He has no cervical adenopathy.  Neurological: He is oriented to person, place, and time.  Skin: Skin is warm and dry. No rash noted. He is not diaphoretic. No erythema. No pallor.  Psychiatric: He has a normal mood and affect. His behavior is normal. Judgment and thought content normal.     Lab Results  Component Value Date   WBC 11.2* 02/16/2013   HGB 14.0 02/16/2013   HCT 42.6 02/16/2013   PLT 164.0 02/16/2013   GLUCOSE 166* 06/03/2013   CHOL 195 02/16/2013   TRIG 90.0 02/16/2013   HDL 41.00 02/16/2013   LDLCALC 136* 02/16/2013   ALT 20 02/16/2013   AST 22 02/16/2013   NA 139 06/03/2013   K 4.5 06/03/2013   CL 104 06/03/2013   CREATININE 1.4 06/03/2013   BUN 19 06/03/2013   CO2 27 06/03/2013   TSH 0.55 02/16/2013   PSA 3.81 02/16/2013   HGBA1C 6.9* 06/03/2013   MICROALBUR 1.2 02/16/2013  Assessment & Plan:

## 2013-11-16 NOTE — Telephone Encounter (Signed)
A new right upper lobe opacity.  Worrisome of tumor. 5.7 x 3.5 cm in size.  Further evaluation by CT chest w/contrast recommended.  Underlying COPD.  Report being faxed

## 2013-11-17 NOTE — Assessment & Plan Note (Signed)
He is doing well on livalo

## 2013-11-17 NOTE — Assessment & Plan Note (Signed)
His BP is well controlled Lytes and renal function are stable 

## 2013-11-17 NOTE — Assessment & Plan Note (Signed)
There is a new mass in his CXR -----> pulmonary referral to consider a biopsy

## 2013-11-17 NOTE — Assessment & Plan Note (Signed)
The A1C shows that his blood sugars are well controlled He will stay on janumet

## 2013-11-30 ENCOUNTER — Institutional Professional Consult (permissible substitution): Payer: Medicare HMO | Admitting: Internal Medicine

## 2013-12-21 ENCOUNTER — Encounter (INDEPENDENT_AMBULATORY_CARE_PROVIDER_SITE_OTHER): Payer: Self-pay

## 2013-12-21 ENCOUNTER — Encounter: Payer: Self-pay | Admitting: Pulmonary Disease

## 2013-12-21 ENCOUNTER — Ambulatory Visit (INDEPENDENT_AMBULATORY_CARE_PROVIDER_SITE_OTHER): Payer: Commercial Managed Care - HMO | Admitting: Pulmonary Disease

## 2013-12-21 VITALS — BP 152/92 | HR 92 | Ht 69.0 in | Wt 251.0 lb

## 2013-12-21 DIAGNOSIS — R918 Other nonspecific abnormal finding of lung field: Secondary | ICD-10-CM

## 2013-12-21 DIAGNOSIS — R222 Localized swelling, mass and lump, trunk: Secondary | ICD-10-CM

## 2013-12-21 DIAGNOSIS — J449 Chronic obstructive pulmonary disease, unspecified: Secondary | ICD-10-CM

## 2013-12-21 DIAGNOSIS — J4489 Other specified chronic obstructive pulmonary disease: Secondary | ICD-10-CM

## 2013-12-21 NOTE — Progress Notes (Signed)
Chief Complaint  Patient presents with  . Pulmonary Consult    Has seen [facility] in the past. Referred for lung mass by Dr. Ronnald Ramp.    History of Present Illness: Jesse Garrison is a 68 y.o. male former smoker for evaluation of lung mass.  He has hx of COPD/emphysema.  He was noted to have Rt lung nodule in 2011.  This was stable in appearance through 2013.  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  He has been followed at [facility] for CT chest screening research protocol.  He was told that he had new lung lesion on CT chest last month.  He had CXR on 11/16/13 which showed new Rt upper lung mass.  He denies chest pain, fever, cough, sputum, or hemoptysis.  He reports his breathing has been doing better since he has been using breo.    He no longer smokers cigarettes, but will sometimes smoke a hookah.  Tests: PFT 04/03/13 >> FEV1 1.42 (52%), FEV1% 49, TLC 5.78 (87%), DLCO 44%  Jesse Garrison  has a past medical history of Hypertension; Hyperlipidemia; COPD (chronic obstructive pulmonary disease); BPH (benign prostatic hyperplasia); Osteoarthritis; Diabetes; and Personal history of colonic adenomas (05/08/2013).  Jesse Garrison  has past surgical history that includes fatty tissue removal (1996).  Prior to Admission medications   Medication Sig Start Date End Date Taking? Authorizing Provider  albuterol (PROVENTIL HFA;VENTOLIN HFA) 108 (90 BASE) MCG/ACT inhaler Inhale 2 puffs into the lungs every 6 (six) hours as needed. 04/12/11  Yes Janith Lima, MD  amLODipine-olmesartan (AZOR) 5-40 MG per tablet Take 1 tablet by mouth daily. 10/19/13  Yes Janith Lima, MD  diclofenac sodium (VOLTAREN) 1 % GEL Apply 2 g topically 4 (four) times daily. 06/04/13  Yes Janith Lima, MD  Fluticasone Furoate-Vilanterol (BREO ELLIPTA) 100-25 MCG/INH AEPB Inhale 1 Inhaler into the lungs every morning. 04/03/13  Yes Tanda Rockers, MD  Multiple Vitamins-Minerals (CENTRUM SILVER PO)  Take 1 tablet by mouth daily.   Yes Historical Provider, MD  Pitavastatin Calcium (LIVALO) 4 MG TABS Take 1 tablet (4 mg total) by mouth daily. 10/19/13  Yes Janith Lima, MD  roflumilast (DALIRESP) 500 MCG TABS tablet Take 1 tablet (500 mcg total) by mouth daily. 10/19/13  Yes Janith Lima, MD  SitaGLIPtin-MetFORMIN HCl (JANUMET XR) 50-1000 MG TB24 Take 1 tablet by mouth daily. 10/19/13  Yes Janith Lima, MD  terazosin (HYTRIN) 5 MG capsule Take 5 mg by mouth at bedtime.     Yes Historical Provider, MD    No Known Allergies  His family history includes Diabetes in his other; Emphysema in his mother. There is no history of Colon cancer, Esophageal cancer, Rectal cancer, or Stomach cancer.  He  reports that he quit smoking about 2 years ago. His smoking use included Cigarettes. He has a 50 pack-year smoking history. He has never used smokeless tobacco. He reports that he drinks about .6 ounces of alcohol per week. He reports that he does not use illicit drugs.   Physical Exam:  General - No distress ENT - No sinus tenderness, no oral exudate, no LAN, no thyromegaly, TM clear, pupils equal/reactive Cardiac - s1s2 regular, no murmur, pulses symmetric Chest - No wheeze/rales/dullness, good air entry, normal respiratory excursion Back - No focal tenderness Abd - Soft, non-tender, no organomegaly, + bowel sounds Ext - No edema Neuro - Normal strength, cranial nerves intact Skin -  No rashes Psych - Normal mood, and behavior   Dg Chest 2 View  11/16/2013   CLINICAL DATA:  Followup pulmonary nodules, history hypertension, diabetes, COPD, emphysema, chronic bronchitis, smoking  EXAM: CHEST  2 VIEW  COMPARISON:  10/25/2011; CT chest 06/17/2012  FINDINGS: Mild enlargement of cardiac silhouette.  Mediastinal contours and pulmonary vascularity normal.  Emphysematous and minimal bronchitic changes consistent with COPD.  New large right upper lobe opacity 5.7 x 3.5 cm worrisome for tumor.  Associated  volume loss right upper lobe.  Chronic accentuation of right basilar markings unchanged.  Remaining lungs clear.  No pleural effusion, pneumothorax or acute osseous findings.  IMPRESSION: New right upper lobe opacity with worrisome for tumor, 5.7 x 3.5 cm in size; further evaluation by CT chest with contrast recommended.  Underlying COPD.  These results will be called to the ordering clinician or representative by the Radiologist Assistant, and communication documented in the PACS Dashboard.   Electronically Signed   By: Lavonia Dana M.D.   On: 11/16/2013 15:45    Lab Results  Component Value Date   WBC 11.2* 02/16/2013   HGB 14.0 02/16/2013   HCT 42.6 02/16/2013   MCV 82.8 02/16/2013   PLT 164.0 02/16/2013    Lab Results  Component Value Date   CREATININE 1.1 11/16/2013   BUN 17 11/16/2013   NA 140 11/16/2013   K 4.3 11/16/2013   CL 106 11/16/2013   CO2 24 11/16/2013    Lab Results  Component Value Date   ALT 20 02/16/2013   AST 22 02/16/2013   ALKPHOS 51 02/16/2013   BILITOT 0.6 02/16/2013    Lab Results  Component Value Date   TSH 0.76 11/16/2013    Assessment/Plan:  Chesley Mires, MD [place] [facility] Pager:  [phone]

## 2013-12-21 NOTE — Assessment & Plan Note (Signed)
He has new finding of right upper lung mass.  He reports having CT chest at [facility] recently which showed this.  His CT chest is not available for my review at present.  Will get report and images, and call him to discuss plan after reviewing this.

## 2013-12-21 NOTE — Progress Notes (Signed)
   Subjective:    Patient ID: Jesse Garrison, male    DOB: 1946-02-26, 68 y.o.   MRN: [id number]  HPI    Review of Systems  Constitutional: Negative for fever and unexpected weight change.  HENT: Negative for congestion, dental problem, ear pain, nosebleeds, postnasal drip, rhinorrhea, sinus pressure, sneezing, sore throat and trouble swallowing.   Eyes: Negative for redness and itching.  Respiratory: Positive for cough and shortness of breath. Negative for chest tightness and wheezing.   Cardiovascular: Negative for palpitations and leg swelling.  Gastrointestinal: Negative for nausea and vomiting.  Genitourinary: Negative for dysuria.  Musculoskeletal: Negative for joint swelling.  Skin: Negative for rash.  Neurological: Negative for headaches.  Hematological: Does not bruise/bleed easily.  Psychiatric/Behavioral: Negative for dysphoric mood. The patient is not nervous/anxious.        Objective:   Physical Exam        Assessment & Plan:

## 2013-12-21 NOTE — Assessment & Plan Note (Addendum)
He is stable on current regimen of breo, and daliresp.  Discussed the importance of avoiding all tobacco products.

## 2013-12-21 NOTE — Patient Instructions (Signed)
Will call after reviewing CT chest from [facility] Follow up in 4 weeks

## 2013-12-25 ENCOUNTER — Telehealth: Payer: Self-pay | Admitting: Pulmonary Disease

## 2013-12-29 ENCOUNTER — Telehealth: Payer: Self-pay | Admitting: Pulmonary Disease

## 2013-12-29 DIAGNOSIS — R918 Other nonspecific abnormal finding of lung field: Secondary | ICD-10-CM

## 2013-12-29 NOTE — Telephone Encounter (Signed)
This report has been placed in VS look at.

## 2013-12-29 NOTE — Telephone Encounter (Signed)
Received report from research study CT chest at [facility] from 10/13/13.  Reported to have 8.8 x 6.6 cm irregular shaped mass in Rt upper lung with connection to pleural, and severe emphysema.  Report d/w pt.  He will need full CT chest with contrast to further assess current status of mass, and then determine whether biopsy is needed of this lesion.

## 2013-12-31 ENCOUNTER — Ambulatory Visit: Payer: Medicare HMO | Admitting: Internal Medicine

## 2014-01-07 ENCOUNTER — Other Ambulatory Visit: Payer: Commercial Managed Care - HMO

## 2014-01-22 ENCOUNTER — Ambulatory Visit: Payer: Commercial Managed Care - HMO | Admitting: Pulmonary Disease

## 2014-02-04 ENCOUNTER — Ambulatory Visit (INDEPENDENT_AMBULATORY_CARE_PROVIDER_SITE_OTHER): Payer: Medicare HMO | Admitting: Family Medicine

## 2014-02-04 VITALS — BP 148/82 | HR 80 | Temp 98.3°F | Resp 18 | Ht 69.0 in | Wt 252.0 lb

## 2014-02-04 DIAGNOSIS — Z0289 Encounter for other administrative examinations: Secondary | ICD-10-CM

## 2014-02-04 NOTE — Progress Notes (Addendum)
Subjective:    Patient ID: Jesse Garrison, male    DOB: 1945-08-27, 68 y.o.   MRN: [id number]   This chart was scribed for Ranell Patrick. Carlota Raspberry, MD by Steva Colder, ED Scribe. The patient was seen in room 8 at 11:45 AM.   Chief Complaint  Patient presents with  . CPE    DOT    HPI  Jesse Garrison is a 68 y.o. 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 PCP is Dr. Ronnald Ramp. Last office visit with PCP was November 16, 2013. His last DOT physical was February 12, 2013. He did not pass the physical that time due to poorly controlled COPD and poor PFT results. He was seen for a F/U on February 23, 2013 by Dr. Edilia Bo. He has been placed on a new inhaler and had a clearance letter from Dr. Melvyn Novas from a pulmonary standpoint. One year card was given. His current card expires on February 11, 2014.  DM: A1C of 6.8 on November 16, 2013. Takes Janumet, he does not take Insulin. No symptomatic lows.   COPD: Last PFT August, 2014. Initial FVC of 81 and FEV 52 Pre-broncho dilator.  Without significant change post-broncho dilator. Of note, new lung mass in right upper lung noted in March of this year. Notes form Pulmonary office planning for repeat CT. By May 11th office visit, COPD was stable on current regimen. O2 Sat of 92% on room air. He states that there have been no changes in his medications. He denies changes in his symptoms.  He denies SOB, Cough, lightheadedness, syncope. He uses [employer] once a day. He has not had any breakthrough symptoms. He states that he is supposed to see Dr. Melvyn Novas, and he cancelled the appointment to come to [facility] today.   HTN: He currently takes Azor for his HTN. He states that he does not check his blood pressure. He does not miss any medications.   See paperwork on DOT form for history and ROS.   He denies any other associated symptoms. He denies SOB while getting out of the vehicle or sitting in the vehicle.   Patient Active Problem List   Diagnosis Date Noted  .  Mass of lung 11/16/2013  . Personal history of colonic adenomas 05/08/2013  . Hyperlipidemia LDL goal < 100 02/16/2013  . Routine general medical examination at a health care facility 06/11/2012  . Snoring 12/18/2011  . Cataract 12/18/2011  . Type II or unspecified type diabetes mellitus without mention of complication, uncontrolled 07/19/2011  . HYPERTENSION 07/27/2010  . COPD with emphysema 07/27/2010  . BENIGN PROSTATIC HYPERTROPHY 07/27/2010  . OSTEOARTHRITIS 07/27/2010   Past Medical History  Diagnosis Date  . Hypertension   . Hyperlipidemia   . COPD (chronic obstructive pulmonary disease)   . BPH (benign prostatic hyperplasia)   . Osteoarthritis   . Diabetes   . Personal history of colonic adenomas 05/08/2013   Past Surgical History  Procedure Laterality Date  . Fatty tissue removal  1996    forehead   No Known Allergies Prior to Admission medications   Medication Sig Start Date End Date Taking? Authorizing Provider  amLODipine-olmesartan (AZOR) 5-40 MG per tablet Take 1 tablet by mouth daily. 10/19/13  Yes Janith Lima, MD  diclofenac sodium (VOLTAREN) 1 % GEL Apply 2 g topically 4 (four) times daily. 06/04/13  Yes Janith Lima, MD  Fluticasone Furoate-Vilanterol (BREO ELLIPTA) 100-25 MCG/INH AEPB Inhale 1 Inhaler into  the lungs every morning. 04/03/13  Yes Tanda Rockers, MD  Multiple Vitamins-Minerals (CENTRUM SILVER PO) Take 1 tablet by mouth daily.   Yes Historical Provider, MD  Pitavastatin Calcium (LIVALO) 4 MG TABS Take 1 tablet (4 mg total) by mouth daily. 10/19/13  Yes Janith Lima, MD  roflumilast (DALIRESP) 500 MCG TABS tablet Take 1 tablet (500 mcg total) by mouth daily. 10/19/13  Yes Janith Lima, MD  SitaGLIPtin-MetFORMIN HCl (JANUMET XR) 50-1000 MG TB24 Take 1 tablet by mouth daily. 10/19/13  Yes Janith Lima, MD  terazosin (HYTRIN) 5 MG capsule Take 5 mg by mouth at bedtime.     Yes Historical Provider, MD  albuterol (PROVENTIL HFA;VENTOLIN HFA) 108 (90  BASE) MCG/ACT inhaler Inhale 2 puffs into the lungs every 6 (six) hours as needed. 04/12/11   Janith Lima, MD     Review of Systems  Constitutional: Negative for fatigue and unexpected weight change.  Eyes: Negative for visual disturbance.  Respiratory: Negative for cough, chest tightness and shortness of breath.   Cardiovascular: Negative for chest pain, palpitations and leg swelling.  Gastrointestinal: Negative for abdominal pain and blood in stool.  Neurological: Negative for dizziness, light-headedness and headaches.   See DOT paperwork.     Objective:   Physical Exam  Vitals reviewed. Constitutional: He is oriented to person, place, and time. He appears well-developed and well-nourished. No distress.  Overweight/obese.   HENT:  Head: Normocephalic and atraumatic.  Right Ear: External ear normal.  Left Ear: External ear normal.  Mouth/Throat: Oropharynx is clear and moist.  Eyes: Conjunctivae and EOM are normal. Pupils are equal, round, and reactive to light.  Neck: Normal range of motion. Neck supple. No thyromegaly present.  Cardiovascular: Normal rate, regular rhythm, normal heart sounds and intact distal pulses.   Pulmonary/Chest: Effort normal and breath sounds normal. No respiratory distress. He has no wheezes. He has no rales.  Abdominal: Soft. He exhibits no distension. There is no tenderness. Hernia confirmed negative in the right inguinal area and confirmed negative in the left inguinal area.  Genitourinary: Prostate normal.  Musculoskeletal: Normal range of motion. He exhibits no edema and no tenderness.  Lymphadenopathy:    He has no cervical adenopathy.  Neurological: He is alert and oriented to person, place, and time. He has normal reflexes.  Skin: Skin is warm and dry.  Psychiatric: He has a normal mood and affect. His behavior is normal.          Filed Vitals:   02/04/14 1050  BP: 160/76  Pulse: 96  Temp: 98.3 F (36.8 C)  TempSrc: Oral  Resp:  18  Height: 5\' 9"  (1.753 m)  Weight: 252 lb (114.306 kg)  SpO2: 92%     Assessment & Plan:  Jesse Garrison is a 68 y.o. male Health examination of defined subpopulation Underlying COPD with decreased FEV 1, but cleared by pulmonary prior, stable by notes in system, denies cough fits or cough syncope and no breathlessness at rest. Dm controlled, HTN not at goal - 3 month card, and he can have pulmonary send clearance letter.  -3 month card, see DOT ppwk.    No orders of the defined types were placed in this encounter.   Patient Instructions  Blood pressure a little too high for full 1 year card - 3 month card issued. Needs to be under 140/90.  Have your pulmonary doctor send a letter to me clearing you to drive.  Let me know if  there are any questions.        I personally performed the services described in this documentation, which was scribed in my presence. The recorded information has been reviewed and is accurate.

## 2014-02-04 NOTE — Patient Instructions (Signed)
Blood pressure a little too high for full 1 year card - 3 month card issued. Needs to be under 140/90.  Have your pulmonary doctor send a letter to me clearing you to drive.  Let me know if there are any questions.

## 2014-02-08 ENCOUNTER — Telehealth: Payer: Self-pay | Admitting: Pulmonary Disease

## 2014-02-08 DIAGNOSIS — R918 Other nonspecific abnormal finding of lung field: Secondary | ICD-10-CM

## 2014-02-08 MED ORDER — FLUTICASONE FUROATE-VILANTEROL 100-25 MCG/INH IN AEPB: 1.0000 | INHALATION_SPRAY | Freq: Every day | RESPIRATORY_TRACT | Status: AC

## 2014-02-08 NOTE — Telephone Encounter (Signed)
Went to speak with patient in lobby.  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  Advised pt we are unable to provide samples of the Azor as [facility] does not treat this but will happily provide Breo samples (2 samples documented per protocol).  Advised pt that [facility] is currently seeing patients so unable to define a time that his letter will be completed > pt stated no rush as this is not due until Sept 2015.  He would like a call once this is completed and he will pick it up.  ((when we call pt back regarding letter, he will need a follow up appt w/ [facility], my computer was frozen.  Thanks!))  Dr Halford Chessman, please advise if okay to write DOT letter clearing pt to drive with his COPD.  See 6.25.15 ov by PCP: Assessment & Plan:   Jesse Garrison is a 68 y.o. male  Health examination of defined subpopulation  Underlying COPD with decreased FEV 1, but cleared by pulmonary prior, stable by notes in system, denies cough fits or cough syncope and no breathlessness at rest. Dm controlled, HTN not at goal - 3 month card, and he can have pulmonary send clearance letter.  -3 month card, see DOT ppwk.  No orders of the defined types were placed in this encounter.  Patient Instructions   Blood pressure a little too high for full 1 year card - 3 month card issued. Needs to be under 140/90.  Have your pulmonary doctor send a letter to me clearing you to drive.  Let me know if there are any questions.

## 2014-02-09 ENCOUNTER — Telehealth: Payer: Self-pay | Admitting: Internal Medicine

## 2014-02-09 ENCOUNTER — Other Ambulatory Visit (INDEPENDENT_AMBULATORY_CARE_PROVIDER_SITE_OTHER): Payer: Commercial Managed Care - HMO

## 2014-02-09 DIAGNOSIS — E1165 Type 2 diabetes mellitus with hyperglycemia: Secondary | ICD-10-CM

## 2014-02-09 DIAGNOSIS — IMO0001 Reserved for inherently not codable concepts without codable children: Secondary | ICD-10-CM

## 2014-02-09 DIAGNOSIS — I10 Essential (primary) hypertension: Secondary | ICD-10-CM

## 2014-02-09 DIAGNOSIS — R222 Localized swelling, mass and lump, trunk: Secondary | ICD-10-CM

## 2014-02-09 DIAGNOSIS — R918 Other nonspecific abnormal finding of lung field: Secondary | ICD-10-CM

## 2014-02-09 LAB — BASIC METABOLIC PANEL
BUN: 19 mg/dL (ref 6–23)
CO2: 22 mEq/L (ref 19–32)
CREATININE: 1.3 mg/dL (ref 0.4–1.5)
Calcium: 9.5 mg/dL (ref 8.4–10.5)
Chloride: 107 mEq/L (ref 96–112)
GFR: 71.07 mL/min (ref >60.00)
GLUCOSE: 105 mg/dL — AB (ref 70–99)
POTASSIUM: 4.2 meq/L (ref 3.5–5.1)
Sodium: 137 mEq/L (ref 135–145)

## 2014-02-09 MED ORDER — LOSARTAN POTASSIUM 100 MG PO TABS: 100.0000 mg | ORAL_TABLET | Freq: Every day | ORAL | Status: DC

## 2014-02-09 MED ORDER — AMLODIPINE BESYLATE 5 MG PO TABS: 5.0000 mg | ORAL_TABLET | Freq: Every day | ORAL | Status: DC

## 2014-02-09 NOTE — Telephone Encounter (Signed)
He was seen on 12/21/13 for lung mass.  He was supposed to have CT chest to further assess status of lung mass prior to arrange for lung biopsy.  This has not been done yet.  Please inform patient that it is absolute of paramount importance that he have this test done.  After review of his CT chest will, then determine his fitness for driving.

## 2014-02-09 NOTE — Telephone Encounter (Signed)
Sample policy is now in effect, samples no longer available for his BP medication. Pt would like a lower cost alternative, unsure this azor for be changed to the two generics. Thanks

## 2014-02-09 NOTE — Telephone Encounter (Signed)
Pt came in to request samples for BP medication if any are currently available. Please contact pt when the request is reviewed.

## 2014-02-09 NOTE — Telephone Encounter (Signed)
done

## 2014-02-09 NOTE — Telephone Encounter (Signed)
Advised pt of the below Pt had appt 01/07/14 and cancelled the appt Pt requesting that I call them and get him an appt ASAP bc he needs this letter now.  Spoke with Rose at [facility] ----  Pt scheduled for CT 7/2 at 1pm Needs to come by our office either today 6/30 or tomorrow 7/1 and have blood work drawn (BMET) NPO 2 hours prior to test.  Expressed understanding Nothing further needed.

## 2014-02-10 NOTE — Telephone Encounter (Signed)
Pt notified and has picked up Rx

## 2014-02-11 ENCOUNTER — Other Ambulatory Visit: Payer: Commercial Managed Care - HMO

## 2014-02-15 ENCOUNTER — Other Ambulatory Visit: Payer: Commercial Managed Care - HMO

## 2014-02-18 ENCOUNTER — Ambulatory Visit (INDEPENDENT_AMBULATORY_CARE_PROVIDER_SITE_OTHER)
Admission: RE | Admit: 2014-02-18 | Discharge: 2014-02-18 | Disposition: A | Discharge status: Home / Self Care | Payer: Commercial Managed Care - HMO | Source: Ambulatory Visit | Attending: Pulmonary Disease | Admitting: Pulmonary Disease

## 2014-02-18 DIAGNOSIS — R222 Localized swelling, mass and lump, trunk: Secondary | ICD-10-CM

## 2014-02-18 DIAGNOSIS — R918 Other nonspecific abnormal finding of lung field: Secondary | ICD-10-CM

## 2014-02-18 MED ORDER — IOHEXOL 300 MG/ML  SOLN
80.0000 mL | Freq: Once | INTRAMUSCULAR | Status: AC | PRN
  Administered 2014-02-18: 80 mL via INTRAVENOUS

## 2014-02-23 ENCOUNTER — Telehealth: Payer: Self-pay | Admitting: Pulmonary Disease

## 2014-02-23 ENCOUNTER — Encounter: Payer: Self-pay | Admitting: Pulmonary Disease

## 2014-02-23 DIAGNOSIS — C3490 Malignant neoplasm of unspecified part of unspecified bronchus or lung: Secondary | ICD-10-CM

## 2014-02-23 NOTE — Telephone Encounter (Signed)
Ct Chest W Contrast  02/18/2014    CLINICAL DATA:  Evaluate right upper lobe lung mass.   EXAM: CT CHEST WITH CONTRAST   TECHNIQUE: Multidetector CT imaging of the chest was performed during intravenous contrast administration.   CONTRAST:  75mL OMNIPAQUE IOHEXOL 300 MG/ML  SOLN   COMPARISON:  Chest x-ray from 11/16/2013 and CT chest 06/17/2012   FINDINGS:  Marked changes of centrilobular and paraseptal emphysema noted. Right upper lobe lung mass measures 4.2 x 2.4 x 2.5 cm. Along the paramediastinal left upper lobe there is a smaller subpleural nodule measuring 1.7 x 1.2 x 2.3 cm.  The heart size is normal. There is no pericardial effusion. There is calcified atherosclerotic disease involving the thoracic aorta.  No enlarged mediastinal or hilar lymph nodes. No axillary or supraclavicular adenopathy.  Incidental imaging through the upper abdomen shows normal appearing adrenal glands. No acute findings identified within the upper abdomen.  Review of the visualized osseous structures is significant for mild multi level ventral endplate spur formation.   IMPRESSION:  1. There is a persistent mass within the right upper lobe when compared with chest x-ray from 11/16/2013. Findings are concerning for primary pulmonary neoplasm. Recommend further assessment with PET-CT.  2. Emphysema.  3. Atherosclerotic disease.    Electronically Signed   By: Kerby Moors M.D.   On: 02/18/2014 17:18     Spoke with pt.  He is currently in [place] and will not be returning to [place] until beginning of August.    I have explained to him that main concern is that these lesions could represent lung cancer.  He will need PET scan first to assess this, and assist in determining optimal location for biopsy attempt if needed.  Will have [facility] call him to schedule PET scan >> he can be reached at [phone].

## 2014-02-23 NOTE — Telephone Encounter (Signed)
I have contacted the patient and he stated that he would be back in [place] by 03/20/14. I scheduled the PET scan for 03/22/14 at 8:00 am at [facility]. Pt is aware of appointment date, time and location as well as instructions. I also mailed patient the above information to his home address. PET Scan scheduled 03/22/14 at [facility]. Rhonda J Cobb

## 2014-03-04 ENCOUNTER — Ambulatory Visit (HOSPITAL_COMMUNITY): Payer: Commercial Managed Care - HMO

## 2014-03-16 ENCOUNTER — Ambulatory Visit (INDEPENDENT_AMBULATORY_CARE_PROVIDER_SITE_OTHER): Payer: Medicare HMO | Admitting: Family Medicine

## 2014-03-16 VITALS — BP 140/62 | HR 73 | Temp 98.2°F | Resp 18 | Ht 69.0 in | Wt 252.0 lb

## 2014-03-16 DIAGNOSIS — Z0289 Encounter for other administrative examinations: Secondary | ICD-10-CM

## 2014-03-16 NOTE — Patient Instructions (Signed)
Your blood pressure passes today, and as cleared from your pulmonary doctor - 1 year card from last visit.

## 2014-03-16 NOTE — Progress Notes (Addendum)
Subjective:    Patient ID: Jesse Garrison, male    DOB: 05/08/1946, 68 y.o.   MRN: [id number] This chart was scribed for Merri Ray, MD by Cathie Hoops, ED Scribe. The patient was seen in Room 10. The patient's care was started at 1:52 PM.  Authored by Janeann Forehand, MD - unable to change in [facility].   Chief Complaint  Patient presents with   Follow-up    Recieved a letter from [place] for medical clearance, here to follow up for new DOT card    HPI HPI Comments: Jesse Garrison is a 68 y.o. male who presents to the [facility] for DOT physical exam follow-up. Pt denies any changes in his breathing. Pt denies SOB at rest, cough, light-headedness, dizziness, or general SOB. Pt reports he is feeling better than he has. Pt reports he switched to generic BP med recently. Pt reports home checking his BP, borderline but stable readings.   Follow-up of DOT exam, he was seen 6/25. Hx of diabetes with A1c controlled as of April 6. Not on insulin. HTN and COPD. 3 month card was giving at 6/25 visit due to controlled BP of 160/76 and with hx of COPD having PFTs August 2014 with FVC: 81, FEV: 52. He denied cough, syncope and no breathlessness at rest. Additionally, no new COPD symptoms at that time and was compliant with medications but requested clearance letter from pulmonary given his PFT results. He is also under the care of pulmonary for a mass in the right, upper lobe of his lungs with further imaging pending. Letter completed by Dr. Halford Chessman on 7/14 stating that he was well-controlled on inhaler therapy and there were no pulmonary restrictions in his ability to drive.  Patient Active Problem List   Diagnosis Date Noted   Mass of lung 11/16/2013   Personal history of colonic adenomas 05/08/2013   Hyperlipidemia LDL goal < 100 02/16/2013   Routine general medical examination at a health care facility 06/11/2012   Snoring 12/18/2011   Cataract 12/18/2011   Type II or unspecified  type diabetes mellitus without mention of complication, uncontrolled 07/19/2011   HYPERTENSION 07/27/2010   COPD with emphysema 07/27/2010   BENIGN PROSTATIC HYPERTROPHY 07/27/2010   OSTEOARTHRITIS 07/27/2010   Past Medical History  Diagnosis Date   Hypertension    Hyperlipidemia    COPD (chronic obstructive pulmonary disease)    BPH (benign prostatic hyperplasia)    Osteoarthritis    Diabetes    Personal history of colonic adenomas 05/08/2013   Past Surgical History  Procedure Laterality Date   Fatty tissue removal  1996    forehead   No Known Allergies Prior to Admission medications   Medication Sig Start Date End Date Taking? Authorizing Provider  albuterol (PROVENTIL HFA;VENTOLIN HFA) 108 (90 BASE) MCG/ACT inhaler Inhale 2 puffs into the lungs every 6 (six) hours as needed. 04/12/11  Yes Janith Lima, MD  amLODipine (NORVASC) 5 MG tablet Take 1 tablet (5 mg total) by mouth daily. 02/09/14  Yes Janith Lima, MD  diclofenac sodium (VOLTAREN) 1 % GEL Apply 2 g topically 4 (four) times daily. 06/04/13  Yes Janith Lima, MD  Fluticasone Furoate-Vilanterol (BREO ELLIPTA) 100-25 MCG/INH AEPB Inhale 1 Inhaler into the lungs every morning. 04/03/13  Yes Tanda Rockers, MD  losartan (COZAAR) 100 MG tablet Take 1 tablet (100 mg total) by mouth daily. 02/09/14  Yes Janith Lima, MD  Multiple Vitamins-Minerals (CENTRUM SILVER PO) Take 1  tablet by mouth daily.   Yes Historical Provider, MD  Pitavastatin Calcium (LIVALO) 4 MG TABS Take 1 tablet (4 mg total) by mouth daily. 10/19/13  Yes Janith Lima, MD  roflumilast (DALIRESP) 500 MCG TABS tablet Take 1 tablet (500 mcg total) by mouth daily. 10/19/13  Yes Janith Lima, MD  SitaGLIPtin-MetFORMIN HCl (JANUMET XR) 50-1000 MG TB24 Take 1 tablet by mouth daily. 10/19/13  Yes Janith Lima, MD  terazosin (HYTRIN) 5 MG capsule Take 5 mg by mouth at bedtime.     Yes Historical Provider, MD   History   Social History   Marital  Status: Single    Spouse Name: N/A    Number of Children: N/A   Years of Education: N/A   Occupational History   Consulting civil engineer    Social History Main Topics   Smoking status: Former Smoker -- 1.00 packs/day for 50 years    Types: Cigarettes    Quit date: 06/19/2011   Smokeless tobacco: Never Used   Alcohol Use: 0.6 oz/week    1 Cans of beer per week   Drug Use: No   Sexual Activity: Not Currently   Other Topics Concern   Not on file   Social History Narrative   No narrative on file   Review of Systems  Respiratory: Negative for cough and shortness of breath.   Cardiovascular: Negative for leg swelling.  Neurological: Negative for syncope.  All other systems reviewed and are negative.   Objective:   Physical Exam  Nursing note and vitals reviewed. Constitutional: He is oriented to person, place, and time. He appears well-developed and well-nourished. No distress.  HENT:  Head: Normocephalic and atraumatic.  Eyes: Conjunctivae and EOM are normal.  Neck: Neck supple. No tracheal deviation present.  Cardiovascular: Normal rate, regular rhythm and normal heart sounds.  Exam reveals no gallop and no friction rub.   No murmur heard. Pulmonary/Chest: Effort normal. No respiratory distress.  He has a few course breathes on the upper right lobe, otherwise normal. Pt was able to talk in a full sentence without breaking or stopping.  Musculoskeletal: Normal range of motion.  No lower extremity swelling.  Neurological: He is alert and oriented to person, place, and time.  Skin: Skin is warm and dry.  Psychiatric: He has a normal mood and affect. His behavior is normal.   Filed Vitals:   03/16/14 1247  BP: 140/62  Pulse: 73  Temp: 98.2 F (36.8 C)  TempSrc: Oral  Resp: 18  Height: 5\' 9"  (1.753 m)  Weight: 252 lb (114.306 kg)  SpO2: 92%   Manual BP reading on his left arm with large cuff: 142/68 Manual BP reading on his right arm with large cuff:  136/72 Assessment & Plan:  Jesse Garrison is a 68 y.o. male Health examination of defined subpopulation - repeat testing of BP- passes for 1 year. clearnace form pulmonary noted, and no cough syncope or dyspnea at rest. 1 year card form last eval. Expires 02/05/15. See DOT paperwork.   No orders of the defined types were placed in this encounter.   Patient Instructions  Your blood pressure passes today, and as cleared from your pulmonary doctor - 1 year card from last visit.    I personally performed the services described in this documentation, which was scribed in my presence. The recorded information has been reviewed and considered, and addended by me as needed.

## 2014-03-22 ENCOUNTER — Ambulatory Visit (HOSPITAL_COMMUNITY): Admission: RE | Admit: 2014-03-22 | Payer: Medicare HMO | Source: Ambulatory Visit

## 2014-03-22 ENCOUNTER — Encounter (HOSPITAL_COMMUNITY)
Admission: RE | Admit: 2014-03-22 | Discharge: 2014-03-22 | Disposition: A | Discharge status: Home / Self Care | Payer: Medicare HMO | Source: Ambulatory Visit | Attending: Pulmonary Disease | Admitting: Pulmonary Disease

## 2014-03-22 ENCOUNTER — Encounter (HOSPITAL_COMMUNITY): Payer: Self-pay

## 2014-03-22 DIAGNOSIS — C3490 Malignant neoplasm of unspecified part of unspecified bronchus or lung: Secondary | ICD-10-CM | POA: Insufficient documentation

## 2014-03-22 LAB — GLUCOSE, CAPILLARY: GLUCOSE-CAPILLARY: 118 mg/dL — AB (ref 70–99)

## 2014-03-22 MED ORDER — FLUDEOXYGLUCOSE F - 18 (FDG) INJECTION
12.4000 | Freq: Once | INTRAVENOUS | Status: AC | PRN
Start: 2014-03-22 — End: 2014-03-22

## 2014-03-25 ENCOUNTER — Telehealth: Payer: Self-pay | Admitting: Pulmonary Disease

## 2014-03-25 DIAGNOSIS — C3411 Malignant neoplasm of upper lobe, right bronchus or lung: Secondary | ICD-10-CM

## 2014-03-25 NOTE — Telephone Encounter (Signed)
Nm Pet Image Initial (pi) Skull Base To Thigh  03/22/2014   CLINICAL DATA:  Initial treatment strategy for right upper lung mass.  EXAM: NUCLEAR MEDICINE PET SKULL BASE TO THIGH  TECHNIQUE: 12.4 mCi F-18 FDG was injected intravenously. Full-ring PET imaging was performed from the skull base to thigh after the radiotracer. CT data was obtained and used for attenuation correction and anatomic localization.  FASTING BLOOD GLUCOSE:  Value: 118 mg/dl  COMPARISON:  CT chest dated 02/18/2014  FINDINGS: NECK  No hypermetabolic lymph nodes in the neck.  CHEST  Spiculated 3.8 x 5.4 cm right upper lobe mass with pleural tagging. Associated hypermetabolism, max SUV 9.2. This appearance is compatible with primary bronchogenic neoplasm.  Underlying extensive centrilobular and paraseptal emphysematous changes.  No hypermetabolic mediastinal or hilar nodes.  ABDOMEN/PELVIS  No abnormal hypermetabolic activity within the liver, pancreas, or spleen.  Mild diffuse low-density thickening of the bilateral adrenal glands, without discrete nodule/mass, with mild hypermetabolism matching that of background liver. Max SUV 3.7 on the right and 3.9 on the left. This appearance is compatible with adrenal hyperplasia.  No hypermetabolic lymph nodes in the abdomen or pelvis.  SKELETON  No focal hypermetabolic activity to suggest skeletal metastasis.  IMPRESSION: 5.4 cm spiculated right upper lobe mass, as described above, compatible with primary bronchogenic neoplasm.  No findings specific for metastatic disease.  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   Electronically Signed   By: Julian Hy M.D.   On: 03/22/2014 14:56    Results d/w Pt.  Informed him that main concern is for malignancy.  He is willing to proceed with bx.  Best approach is with bx by IR >> d/w Dr. Toni Amend Hoss.  Will arrange for referral to IR to set up bx with Dr. Maryclare Bean.

## 2014-03-26 ENCOUNTER — Telehealth: Payer: Self-pay | Admitting: Pulmonary Disease

## 2014-03-26 NOTE — Telephone Encounter (Signed)
I entered order under my [place] epic access.  Not sure if it gets routed correctly with this access.  I have re-entered order.  Please let me know when appointment for biopsy of Rt upper lung lesion by IR is scheduled.

## 2014-03-26 NOTE — Telephone Encounter (Signed)
Order is in review @IR  pt will be called by tnem [place]

## 2014-03-26 NOTE — Telephone Encounter (Signed)
Noted  

## 2014-03-26 NOTE — Telephone Encounter (Signed)
Dr Halford Chessman i don't see an order can you put one in thanks [place]

## 2014-03-29 NOTE — Telephone Encounter (Signed)
lmtcb x1 fyi for [facility] as well

## 2014-03-30 NOTE — Telephone Encounter (Signed)
Noted  

## 2014-03-31 ENCOUNTER — Ambulatory Visit (HOSPITAL_COMMUNITY): Payer: Commercial Managed Care - HMO

## 2014-03-31 ENCOUNTER — Ambulatory Visit (HOSPITAL_COMMUNITY): Payer: Medicare HMO

## 2014-05-25 ENCOUNTER — Ambulatory Visit (INDEPENDENT_AMBULATORY_CARE_PROVIDER_SITE_OTHER): Payer: Commercial Managed Care - HMO | Admitting: Internal Medicine

## 2014-05-25 ENCOUNTER — Encounter: Payer: Self-pay | Admitting: Internal Medicine

## 2014-05-25 ENCOUNTER — Other Ambulatory Visit (INDEPENDENT_AMBULATORY_CARE_PROVIDER_SITE_OTHER): Payer: Commercial Managed Care - HMO

## 2014-05-25 VITALS — BP 138/70 | HR 77 | Temp 97.8°F | Resp 16 | Ht 69.0 in | Wt 256.0 lb

## 2014-05-25 DIAGNOSIS — R918 Other nonspecific abnormal finding of lung field: Secondary | ICD-10-CM

## 2014-05-25 DIAGNOSIS — I10 Essential (primary) hypertension: Secondary | ICD-10-CM

## 2014-05-25 DIAGNOSIS — Z23 Encounter for immunization: Secondary | ICD-10-CM

## 2014-05-25 DIAGNOSIS — E785 Hyperlipidemia, unspecified: Secondary | ICD-10-CM

## 2014-05-25 DIAGNOSIS — E118 Type 2 diabetes mellitus with unspecified complications: Secondary | ICD-10-CM

## 2014-05-25 LAB — BASIC METABOLIC PANEL
BUN: 15 mg/dL (ref 6–23)
CO2: 23 mEq/L (ref 19–32)
Calcium: 9.4 mg/dL (ref 8.4–10.5)
Chloride: 107 mEq/L (ref 96–112)
Creatinine, Ser: 1.2 mg/dL (ref 0.4–1.5)
GFR: 77.2 mL/min (ref >60.00)
GLUCOSE: 144 mg/dL — AB (ref 70–99)
POTASSIUM: 4.2 meq/L (ref 3.5–5.1)
Sodium: 139 mEq/L (ref 135–145)

## 2014-05-25 LAB — HEMOGLOBIN A1C: Hgb A1c MFr Bld: 7.3 % — ABNORMAL HIGH (ref 4.6–6.5)

## 2014-05-25 MED ORDER — SITAGLIP PHOS-METFORMIN HCL ER 50-1000 MG PO TB24
1.0000 | ORAL_TABLET | Freq: Every day | ORAL | Status: DC
Start: 2014-05-25 — End: 2014-09-23

## 2014-05-25 NOTE — Progress Notes (Signed)
Pre visit review using our clinic review tool, if applicable. No additional management support is needed unless otherwise documented below in the visit note. 

## 2014-05-25 NOTE — Assessment & Plan Note (Signed)
He refuses to have a biopsy done

## 2014-05-25 NOTE — Assessment & Plan Note (Signed)
His BP is well controlled Lytes and renal function are stable 

## 2014-05-25 NOTE — Progress Notes (Signed)
Subjective:    Patient ID: Jesse Garrison, male    DOB: [birth year], 68 y.o.   MRN: [id number]  Diabetes He presents for his follow-up diabetic visit. He has type 2 diabetes mellitus. His disease course has been stable. There are no hypoglycemic associated symptoms. Pertinent negatives for hypoglycemia include no dizziness, headaches, seizures or tremors. Pertinent negatives for diabetes include no blurred vision, no chest pain, no fatigue, no foot paresthesias, no foot ulcerations, no polydipsia, no polyphagia, no polyuria, no visual change, no weakness and no weight loss. There are no hypoglycemic complications. Current diabetic treatment includes oral agent (dual therapy). He is compliant with treatment most of the time. His weight is stable. He is following a generally unhealthy diet. When asked about meal planning, he reported none. He has not had a previous visit with a dietician. He never participates in exercise. There is no change in his home blood glucose trend. An ACE inhibitor/angiotensin II receptor blocker is being taken. He does not see a podiatrist.Eye exam is not current.      Review of Systems  Constitutional: Negative.  Negative for fever, chills, weight loss, diaphoresis, appetite change and fatigue.  HENT: Negative.   Eyes: Negative.  Negative for blurred vision.  Respiratory: Positive for shortness of breath. Negative for apnea, cough, choking, chest tightness, wheezing and stridor.   Cardiovascular: Negative.  Negative for chest pain, palpitations and leg swelling.  Gastrointestinal: Negative.  Negative for nausea, vomiting, abdominal pain, diarrhea, constipation and blood in stool.  Endocrine: Negative.  Negative for polydipsia, polyphagia and polyuria.  Genitourinary: Negative.   Musculoskeletal: Negative.  Negative for back pain, gait problem, myalgias and neck pain.  Skin: Negative.  Negative for rash.  Allergic/Immunologic: Negative.   Neurological: Negative.   Negative for dizziness, tremors, seizures, syncope, weakness, light-headedness, numbness and headaches.  Hematological: Negative.  Does not bruise/bleed easily.  Psychiatric/Behavioral: Negative.        Objective:   Physical Exam  Vitals reviewed. Constitutional: He is oriented to person, place, and time. He appears well-developed and well-nourished. No distress.  HENT:  Head: Normocephalic and atraumatic.  Mouth/Throat: Oropharynx is clear and moist. No oropharyngeal exudate.  Eyes: Conjunctivae are normal. Right eye exhibits no discharge. Left eye exhibits no discharge. No scleral icterus.  Neck: Normal range of motion. Neck supple. No JVD present. No tracheal deviation present. No thyromegaly present.  Cardiovascular: Normal rate, regular rhythm and intact distal pulses.  Exam reveals no gallop and no friction rub.   No murmur heard. Pulmonary/Chest: Effort normal. No accessory muscle usage or stridor. Not tachypneic. No respiratory distress. He has no decreased breath sounds. He has no wheezes. He has rhonchi in the right middle field, the right lower field, the left middle field and the left lower field. He has no rales. He exhibits no tenderness.  Abdominal: Soft. Bowel sounds are normal. He exhibits no distension and no mass. There is no tenderness. There is no rebound and no guarding.  Musculoskeletal: Normal range of motion. He exhibits no edema and no tenderness.  Lymphadenopathy:    He has no cervical adenopathy.  Neurological: He is oriented to person, place, and time.  Skin: Skin is warm and dry. No rash noted. He is not diaphoretic. No erythema. No pallor.  Psychiatric: He has a normal mood and affect. His behavior is normal. Judgment and thought content normal.     Lab Results  Component Value Date   WBC 11.2* 02/16/2013   HGB 14.0 02/16/2013  HCT 42.6 02/16/2013   PLT 164.0 02/16/2013   GLUCOSE 105* 02/09/2014   CHOL 122 11/16/2013   TRIG 68.0 11/16/2013   HDL 40.40 11/16/2013     LDLCALC 68 11/16/2013   ALT 20 02/16/2013   AST 22 02/16/2013   NA 137 02/09/2014   K 4.2 02/09/2014   CL 107 02/09/2014   CREATININE 1.3 02/09/2014   BUN 19 02/09/2014   CO2 22 02/09/2014   TSH 0.76 11/16/2013   PSA 3.81 02/16/2013   HGBA1C 6.8* 11/16/2013   MICROALBUR 1.2 02/16/2013       Assessment & Plan:

## 2014-05-25 NOTE — Assessment & Plan Note (Signed)
His blood sugars are adequately well controlled 

## 2014-05-25 NOTE — Patient Instructions (Signed)

## 2014-05-31 ENCOUNTER — Encounter: Payer: Self-pay | Admitting: Internal Medicine

## 2014-07-30 LAB — HM DIABETES EYE EXAM

## 2014-08-18 ENCOUNTER — Encounter: Payer: Self-pay | Admitting: Internal Medicine

## 2014-08-27 ENCOUNTER — Encounter: Payer: Self-pay | Admitting: Internal Medicine

## 2014-09-23 ENCOUNTER — Encounter: Payer: Self-pay | Admitting: Internal Medicine

## 2014-09-23 ENCOUNTER — Other Ambulatory Visit (INDEPENDENT_AMBULATORY_CARE_PROVIDER_SITE_OTHER): Payer: Commercial Managed Care - HMO

## 2014-09-23 ENCOUNTER — Ambulatory Visit (INDEPENDENT_AMBULATORY_CARE_PROVIDER_SITE_OTHER): Payer: Commercial Managed Care - HMO | Admitting: Internal Medicine

## 2014-09-23 VITALS — BP 122/58 | HR 91 | Temp 97.8°F | Resp 16 | Ht 69.0 in | Wt 261.0 lb

## 2014-09-23 DIAGNOSIS — I10 Essential (primary) hypertension: Secondary | ICD-10-CM

## 2014-09-23 DIAGNOSIS — E118 Type 2 diabetes mellitus with unspecified complications: Secondary | ICD-10-CM

## 2014-09-23 DIAGNOSIS — E785 Hyperlipidemia, unspecified: Secondary | ICD-10-CM

## 2014-09-23 DIAGNOSIS — R918 Other nonspecific abnormal finding of lung field: Secondary | ICD-10-CM

## 2014-09-23 LAB — URINALYSIS, ROUTINE W REFLEX MICROSCOPIC
Bilirubin Urine: NEGATIVE
Hgb urine dipstick: NEGATIVE
Ketones, ur: NEGATIVE
Nitrite: NEGATIVE
SPECIFIC GRAVITY, URINE: 1.01 (ref 1.000–1.030)
TOTAL PROTEIN, URINE-UPE24: NEGATIVE
Urine Glucose: NEGATIVE
Urobilinogen, UA: 0.2 (ref 0.0–1.0)
pH: 6 (ref 5.0–8.0)

## 2014-09-23 LAB — COMPREHENSIVE METABOLIC PANEL
ALT: 18 U/L (ref 0–53)
AST: 16 U/L (ref 0–37)
Albumin: 4.1 g/dL (ref 3.5–5.2)
Alkaline Phosphatase: 61 U/L (ref 39–117)
BUN: 15 mg/dL (ref 6–23)
CO2: 27 mEq/L (ref 19–32)
Calcium: 9.7 mg/dL (ref 8.4–10.5)
Chloride: 103 mEq/L (ref 96–112)
Creatinine, Ser: 1.32 mg/dL (ref 0.40–1.50)
GFR: 69.09 mL/min (ref >60.00)
GLUCOSE: 168 mg/dL — AB (ref 70–99)
Potassium: 4.5 mEq/L (ref 3.5–5.1)
Sodium: 137 mEq/L (ref 135–145)
Total Bilirubin: 0.5 mg/dL (ref 0.2–1.2)
Total Protein: 7.2 g/dL (ref 6.0–8.3)

## 2014-09-23 LAB — TSH: TSH: 0.77 u[IU]/mL (ref 0.35–4.50)

## 2014-09-23 LAB — LIPID PANEL
Cholesterol: 192 mg/dL (ref 0–200)
HDL: 39.3 mg/dL (ref >39.00)
LDL Cholesterol: 132 mg/dL — ABNORMAL HIGH (ref 0–99)
NONHDL: 152.7
Total CHOL/HDL Ratio: 5
Triglycerides: 102 mg/dL (ref 0.0–149.0)
VLDL: 20.4 mg/dL (ref 0.0–40.0)

## 2014-09-23 LAB — MICROALBUMIN / CREATININE URINE RATIO
Creatinine,U: 124.4 mg/dL
Microalb Creat Ratio: 1 mg/g (ref 0.0–30.0)
Microalb, Ur: 1.2 mg/dL (ref 0.0–1.9)

## 2014-09-23 LAB — CBC WITH DIFFERENTIAL/PLATELET
BASOS ABS: 0 10*3/uL (ref 0.0–0.1)
Basophils Relative: 0.4 % (ref 0.0–3.0)
EOS ABS: 0.3 10*3/uL (ref 0.0–0.7)
Eosinophils Relative: 2.7 % (ref 0.0–5.0)
HEMATOCRIT: 41.7 % (ref 39.0–52.0)
HEMOGLOBIN: 14.1 g/dL (ref 13.0–17.0)
LYMPHS ABS: 2 10*3/uL (ref 0.7–4.0)
Lymphocytes Relative: 15.4 % (ref 12.0–46.0)
MCHC: 33.7 g/dL (ref 30.0–36.0)
MCV: 77.4 fl — AB (ref 78.0–100.0)
MONO ABS: 0.9 10*3/uL (ref 0.1–1.0)
Monocytes Relative: 7.3 % (ref 3.0–12.0)
NEUTROS ABS: 9.5 10*3/uL — AB (ref 1.4–7.7)
Neutrophils Relative %: 74.2 % (ref 43.0–77.0)
PLATELETS: 216 10*3/uL (ref 150.0–400.0)
RBC: 5.39 Mil/uL (ref 4.22–5.81)
RDW: 15.7 % — AB (ref 11.5–15.5)
WBC: 12.9 10*3/uL — ABNORMAL HIGH (ref 4.0–10.5)

## 2014-09-23 MED ORDER — AMLODIPINE BESYLATE 5 MG PO TABS: 5.0000 mg | ORAL_TABLET | Freq: Every day | ORAL | Status: DC

## 2014-09-23 MED ORDER — SITAGLIP PHOS-METFORMIN HCL ER 50-1000 MG PO TB24: 1.0000 | ORAL_TABLET | Freq: Every day | ORAL | Status: DC

## 2014-09-23 MED ORDER — LOSARTAN POTASSIUM 100 MG PO TABS: 100.0000 mg | ORAL_TABLET | Freq: Every day | ORAL | Status: DC

## 2014-09-23 NOTE — Patient Instructions (Signed)

## 2014-09-23 NOTE — Progress Notes (Signed)
Subjective:    Patient ID: Jesse Garrison, male    DOB: 1945/11/21, 69 y.o.   MRN: [id number]  Diabetes He presents for his follow-up diabetic visit. He has type 2 diabetes mellitus. His disease course has been stable. There are no hypoglycemic associated symptoms. Pertinent negatives for hypoglycemia include no dizziness or headaches. Pertinent negatives for diabetes include no blurred vision, no chest pain, no fatigue, no foot paresthesias, no foot ulcerations, no polydipsia, no polyphagia, no polyuria, no visual change, no weakness and no weight loss. There are no hypoglycemic complications. Symptoms are stable. There are no diabetic complications. He is compliant with treatment some of the time. He is following a generally unhealthy diet. When asked about meal planning, he reported none. He never participates in exercise. There is no change in his home blood glucose trend. An ACE inhibitor/angiotensin II receptor blocker is being taken. He does not see a podiatrist.Eye exam is not current.  Hypertension Pertinent negatives include no blurred vision, chest pain, headaches, palpitations or shortness of breath.      Review of Systems  Constitutional: Negative for fever, chills, weight loss, diaphoresis, appetite change and fatigue.  HENT: Negative.   Eyes: Negative.  Negative for blurred vision.  Respiratory: Negative.  Negative for cough, choking, chest tightness, shortness of breath and stridor.   Cardiovascular: Negative.  Negative for chest pain, palpitations and leg swelling.  Gastrointestinal: Negative.  Negative for nausea, vomiting, abdominal pain, diarrhea, constipation and blood in stool.  Endocrine: Negative.  Negative for polydipsia, polyphagia and polyuria.  Genitourinary: Negative.  Negative for dysuria, flank pain and enuresis.  Musculoskeletal: Negative.  Negative for back pain and arthralgias.  Skin: Negative.  Negative for rash.  Allergic/Immunologic: Negative.     Neurological: Negative.  Negative for dizziness, weakness, light-headedness, numbness and headaches.  Hematological: Negative.  Negative for adenopathy. Does not bruise/bleed easily.  Psychiatric/Behavioral: Negative.        Objective:   Physical Exam  Constitutional: He is oriented to person, place, and time. He appears well-developed and well-nourished. No distress.  HENT:  Head: Normocephalic and atraumatic.  Mouth/Throat: Oropharynx is clear and moist. No oropharyngeal exudate.  Eyes: Conjunctivae are normal. Right eye exhibits no discharge. Left eye exhibits no discharge. No scleral icterus.  Neck: Normal range of motion. Neck supple. No JVD present. No tracheal deviation present. No thyromegaly present.  Cardiovascular: Normal rate, regular rhythm, normal heart sounds and intact distal pulses.  Exam reveals no gallop and no friction rub.   No murmur heard. Pulmonary/Chest: Effort normal and breath sounds normal. No stridor. No respiratory distress. He has no wheezes. He has no rales. He exhibits no tenderness.  Abdominal: Soft. Bowel sounds are normal. He exhibits no distension and no mass. There is no tenderness. There is no rebound and no guarding.  Musculoskeletal: Normal range of motion. He exhibits no edema or tenderness.  Lymphadenopathy:    He has no cervical adenopathy.  Neurological: He is oriented to person, place, and time.  Skin: Skin is warm and dry. No rash noted. He is not diaphoretic. No erythema. No pallor.  Psychiatric: He has a normal mood and affect. His behavior is normal. Judgment and thought content normal.  Vitals reviewed.     Lab Results  Component Value Date   WBC 11.2* 02/16/2013   HGB 14.0 02/16/2013   HCT 42.6 02/16/2013   PLT 164.0 02/16/2013   GLUCOSE 144* 05/25/2014   CHOL 122 11/16/2013   TRIG 68.0 11/16/2013  HDL 40.40 11/16/2013   LDLCALC 68 11/16/2013   ALT 20 02/16/2013   AST 22 02/16/2013   NA 139 05/25/2014   K 4.2 05/25/2014    CL 107 05/25/2014   CREATININE 1.2 05/25/2014   BUN 15 05/25/2014   CO2 23 05/25/2014   TSH 0.76 11/16/2013   PSA 3.81 02/16/2013   HGBA1C 7.3* 05/25/2014   MICROALBUR 1.2 02/16/2013      Assessment & Plan:

## 2014-09-23 NOTE — Progress Notes (Signed)
Pre visit review using our clinic review tool, if applicable. No additional management support is needed unless otherwise documented below in the visit note. 

## 2014-09-26 NOTE — Assessment & Plan Note (Signed)
His BP is well controlled His lytes and renal function are stable 

## 2014-09-26 NOTE — Assessment & Plan Note (Signed)
His blood sugars are well controlled Will cont the current regimen

## 2014-09-26 NOTE — Assessment & Plan Note (Signed)
He has achieved his LDL goal 

## 2014-09-26 NOTE — Assessment & Plan Note (Signed)
I have asked him to f/up with pulmonary about this

## 2014-12-07 ENCOUNTER — Encounter: Payer: Self-pay | Admitting: Pulmonary Disease

## 2014-12-07 ENCOUNTER — Ambulatory Visit (INDEPENDENT_AMBULATORY_CARE_PROVIDER_SITE_OTHER): Payer: Medicare HMO | Admitting: Pulmonary Disease

## 2014-12-07 ENCOUNTER — Ambulatory Visit (INDEPENDENT_AMBULATORY_CARE_PROVIDER_SITE_OTHER)
Admission: RE | Admit: 2014-12-07 | Discharge: 2014-12-07 | Disposition: A | Discharge status: Home / Self Care | Payer: Medicare HMO | Source: Ambulatory Visit | Attending: Pulmonary Disease | Admitting: Pulmonary Disease

## 2014-12-07 VITALS — BP 142/72 | HR 88 | Ht 69.0 in | Wt 265.0 lb

## 2014-12-07 DIAGNOSIS — J449 Chronic obstructive pulmonary disease, unspecified: Secondary | ICD-10-CM

## 2014-12-07 DIAGNOSIS — R918 Other nonspecific abnormal finding of lung field: Secondary | ICD-10-CM

## 2014-12-07 NOTE — Progress Notes (Signed)
Chief Complaint  Patient presents with  . Follow-up    Pt c/o of chest pain x 2 days, SOB. Denies any wheezing, chest congestion, cough.      History of Present Illness: Jesse Garrison is a 69 y.o. male former smoker with lung mass, and COPD/emphysema.  I saw him last June.  He was found to have Rt lung mass which was positive on PET scan.  He was advised he needed biopsy.  He opted to have 2nd opinion in [place].  I am not sure what came of this evaluation, but he has not had biopsy or follow up imaging since last Summer.  He denies cough, wheeze, sputum, chest pain, fever, hemoptysis, or dyspnea.  Tests: PFT 04/03/13 >> FEV1 1.42 (52%), FEV1% 49, TLC 5.78 (87%), DLCO 44% CT chest 02/18/14 >> centrilobular/ paraseptal emphysema, 4.2 cm RUL mass, 2.3 cm LUL mass PET scan 03/22/14 >> 5.4 cm RUL mass 9.2 SUV  PMHx >> HTN, HLD, BPH, DM  PSHx, Medications, Allergies, Fhx, Shx reviewed.  Physical Exam: Blood pressure 142/72, pulse 88, height '5\' 9"'$  (1.753 m), weight 265 lb (120.203 kg), SpO2 90 %. Body mass index is 39.12 kg/(m^2).  General - No distress ENT - No sinus tenderness, no oral exudate, no LAN, no thyromegaly, TM clear, pupils equal/reactive Cardiac - s1s2 regular, no murmur, pulses symmetric Chest - No wheeze/rales/dullness, good air entry, normal respiratory excursion Back - No focal tenderness Abd - Soft, non-tender, no organomegaly, + bowel sounds Ext - No edema Neuro - Normal strength, cranial nerves intact Skin - No rashes Psych - Normal mood, and behavior  Assessment/Plan:  Lung mass. Plan: - will repeat CXR today - I have tried to convey to him that he likely has lung cancer and he needs further assessment  COPD with emphysema. Plan: - continue breo and daliresp - continue prn albuterol  DOT clearance. 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

## 2014-12-07 NOTE — Patient Instructions (Signed)
Chest xray today  Follow up in 3 months 

## 2014-12-10 ENCOUNTER — Telehealth: Payer: Self-pay | Admitting: Pulmonary Disease

## 2014-12-10 NOTE — Telephone Encounter (Signed)
Dg Chest 2 View  12/07/2014   CLINICAL DATA:  Follow-up lung mass.  No chest complaints.  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 There are bilateral emphysematous changes. There is no pleural effusion, pneumothorax or focal consolidation. The heart and mediastinum are stable.  The osseous structures are unremarkable.  IMPRESSION: No active cardiopulmonary disease.  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   Electronically Signed   By: Kathreen Devoid   On: 12/07/2014 15:58    Results d/w pt.   Explained that he still has mass lesion in Rt upper lung, but some of areas appear to have decreased in size compared to imaging studies from 2015.  Jesse Garrison does not feel like this lesion is causing any problems at this time.  I reviewed different options about obtaining biopsy, and explained that main concern is that he has lung cancer until proven otherwise.  Also reviewed different tx options if he is found to have cancer >> he would not be candidate for surgery due to his lung disease.  He would like to continue with clinical and radiographic observation for now.  Will plan to repeat chest xray at his follow up in 3 months.

## 2014-12-30 ENCOUNTER — Encounter: Payer: Self-pay | Admitting: Internal Medicine

## 2015-01-28 ENCOUNTER — Telehealth: Payer: Self-pay | Admitting: Pulmonary Disease

## 2015-01-28 ENCOUNTER — Ambulatory Visit (INDEPENDENT_AMBULATORY_CARE_PROVIDER_SITE_OTHER): Payer: Self-pay | Admitting: Family Medicine

## 2015-01-28 VITALS — BP 138/88 | HR 83 | Temp 98.8°F | Resp 20 | Ht 69.25 in | Wt 262.8 lb

## 2015-01-28 DIAGNOSIS — E119 Type 2 diabetes mellitus without complications: Secondary | ICD-10-CM | POA: Diagnosis not present

## 2015-01-28 DIAGNOSIS — Z021 Encounter for pre-employment examination: Secondary | ICD-10-CM

## 2015-01-28 DIAGNOSIS — Z0000 Encounter for general adult medical examination without abnormal findings: Secondary | ICD-10-CM

## 2015-01-28 DIAGNOSIS — N400 Enlarged prostate without lower urinary tract symptoms: Secondary | ICD-10-CM | POA: Diagnosis not present

## 2015-01-28 DIAGNOSIS — J449 Chronic obstructive pulmonary disease, unspecified: Secondary | ICD-10-CM | POA: Diagnosis not present

## 2015-01-28 LAB — POCT GLYCOSYLATED HEMOGLOBIN (HGB A1C): Hemoglobin A1C: 7.9

## 2015-01-28 NOTE — Telephone Encounter (Signed)
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 Informed pt you were out of office for a week. He states when you return and do letter for [place] to send it to the facility.

## 2015-01-28 NOTE — Progress Notes (Addendum)
Subjective:    Patient ID: Jesse Garrison, male    DOB: Jul 24, 1946, 69 y.o.   MRN: [id number] This chart was scribed for Jesse Haber, MD by Zola Button, Medical Scribe. This patient was seen in Room 5 and the patient's care was started at 12:51 PM.   HPI HPI Comments: Jesse Garrison is a 69 y.o. male with a hx of BPH, DM type II, hypertension, and COPD who [facility] for [facility] physical exam. Patient does not remember what his last A1c was. He does check his blood sugar at home and states they have been fine. Patient quit smoking in 2012.  Patient does have urinary frequency due to BPH.  PCP: [facility] Ron[facility]d Ramp at [facility] Pulmonologist: Dr. Halford Chessman  Lab Results  Component Value Date   HGBA1C 7.3* 05/25/2014    Review of Systems  Genitourinary: Positive for frequency.       Objective:   Physical Exam CONSTITUTIONAL: Well developed/well nourished/obese with extra-pyramidal facial movements akin to tardive dyskinesia HEAD: Normocephalic/atraumatic EYES: EOM/PERRL ENMT: Mucous membranes moist. Ears normal. Upper and lower dentures. NECK: supple no meningeal signs SPINE: entire spine nontender CV: S1/S2 noted, no murmurs/rubs/gallops noted. Repeat blood pressure: 124/60. LUNGS: Bilateral wheezes and rhonchi. ABDOMEN: soft, nontender, no rebound or guarding GU: no cva tenderness NEURO: Pt is awake/alert, moves all extremitiesx4 EXTREMITIES: pulses normal, full ROM SKIN: warm, color normal PSYCH: no abnormalities of mood noted Results for orders placed or performed in visit on 09/23/14  Lipid panel  Result Value Ref Range   Cholesterol 192 0 - 200 mg/dL   Triglycerides 102.0 0.0 - 149.0 mg/dL   HDL 39.30 >39.00 mg/dL   VLDL 20.4 0.0 - 40.0 mg/dL   LDL Cholesterol 132 (H) 0 - 99 mg/dL   Total CHOL/HDL Ratio 5    NonHDL 152.70   Comprehensive metabolic panel  Result Value Ref Range   Sodium 137 135 - 145 mEq/L   Potassium 4.5 3.5 - 5.1 mEq/L   Chloride 103 96 - 112 mEq/L   CO2 27 19 - 32 mEq/L   Glucose, Bld 168 (H) 70 - 99 mg/dL   BUN 15 6 - 23 mg/dL   Creatinine, Ser 1.32 0.40 - 1.50 mg/dL   Total Bilirubin 0.5 0.2 - 1.2 mg/dL   Alkaline Phosphatase 61 39 - 117 U/L   AST 16 0 - 37 U/L   ALT 18 0 - 53 U/L   Total Protein 7.2 6.0 - 8.3 g/dL   Albumin 4.1 3.5 - 5.2 g/dL   Calcium 9.7 8.4 - 10.5 mg/dL   GFR 69.09 >60.00 mL/min  CBC with Differential/Platelet  Result Value Ref Range   WBC 12.9 (H) 4.0 - 10.5 K/uL   RBC 5.39 4.22 - 5.81 Mil/uL   Hemoglobin 14.1 13.0 - 17.0 g/dL   HCT 41.7 39.0 - 52.0 %   MCV 77.4 (L) 78.0 - 100.0 fl   MCHC 33.7 30.0 - 36.0 g/dL   RDW 15.7 (H) 11.5 - 15.5 %   Platelets 216.0 150.0 - 400.0 K/uL   Neutrophils Relative % 74.2 43.0 - 77.0 %   Lymphocytes Relative 15.4 12.0 - 46.0 %   Monocytes Relative 7.3 3.0 - 12.0 %   Eosinophils Relative 2.7 0.0 - 5.0 %   Basophils Relative 0.4 0.0 - 3.0 %   Neutro Abs 9.5 (H) 1.4 - 7.7 K/uL   Lymphs Abs 2.0 0.7 - 4.0 K/uL   Monocytes Absolute 0.9 0.1 -  1.0 K/uL   Eosinophils Absolute 0.3 0.0 - 0.7 K/uL   Basophils Absolute 0.0 0.0 - 0.1 K/uL  Urinalysis, Routine w reflex microscopic  Result Value Ref Range   Color, Urine YELLOW Yellow;Lt. Yellow   APPearance CLOUDY (A) Clear   Specific Gravity, Urine 1.010 1.000-1.030   pH 6.0 5.0 - 8.0   Total Protein, Urine NEGATIVE Negative   Urine Glucose NEGATIVE Negative   Ketones, ur NEGATIVE Negative   Bilirubin Urine NEGATIVE Negative   Hgb urine dipstick NEGATIVE Negative   Urobilinogen, UA 0.2 0.0 - 1.0   Leukocytes, UA MODERATE (A) Negative   Nitrite NEGATIVE Negative   WBC, UA 11-20/hpf (A) 0-2/hpf   RBC / HPF 3-6/hpf (A) 0-2/hpf   Squamous Epithelial / LPF Rare(0-4/hpf) Rare(0-4/hpf)  TSH  Result Value Ref Range   TSH 0.77 0.35 - 4.50 uIU/mL  Microalbumin / creatinine urine ratio  Result Value Ref Range   Microalb, Ur 1.2 0.0 - 1.9 mg/dL   Creatinine,U 124.4 mg/dL   Microalb Creat Ratio  1.0 0.0 - 30.0 mg/g   Results for orders placed or performed in visit on 01/28/15  POCT glycosylated hemoglobin (Hb A1C)  Result Value Ref Range   Hemoglobin A1C 7.9        Assessment & Plan:   This chart was scribed in my presence and reviewed by me personally.    ICD-9-CM ICD-10-CM   1. Annual physical exam V70.0 Z00.00 POCT glycosylated hemoglobin (Hb A1C)  2. Type 2 diabetes mellitus, controlled 250.00 E11.9 POCT glycosylated hemoglobin (Hb A1C)  3. COPD mixed type 496 J44.9   4. BPH (benign prostatic hyperplasia) 600.00 N40.0      Signed, Jesse Haber, MD

## 2015-02-02 NOTE — Telephone Encounter (Signed)
Patient is requesting a letter for Pulmonary Clearance to be submitted to Urgent [facility] on [address]  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  He says that we have done a letter like this before.    Ok to submitted letter. VS - please advise.

## 2015-02-08 ENCOUNTER — Encounter: Payer: Self-pay | Admitting: Pulmonary Disease

## 2015-02-08 NOTE — Telephone Encounter (Signed)
Letter completed.

## 2015-02-09 NOTE — Telephone Encounter (Signed)
Spoke with the pt and notified letter completed  He states that he no longer needs this faxed to the UC  He would like to keep the letter for his records, and so I have mailed this to his home address which I verified with him  Nothing further needed

## 2015-03-30 ENCOUNTER — Encounter: Payer: Self-pay | Admitting: Pulmonary Disease

## 2015-03-30 ENCOUNTER — Ambulatory Visit (INDEPENDENT_AMBULATORY_CARE_PROVIDER_SITE_OTHER): Payer: Medicare HMO | Admitting: Pulmonary Disease

## 2015-03-30 VITALS — BP 134/60 | HR 90 | Ht 69.0 in | Wt 265.0 lb

## 2015-03-30 DIAGNOSIS — J449 Chronic obstructive pulmonary disease, unspecified: Secondary | ICD-10-CM

## 2015-03-30 DIAGNOSIS — R918 Other nonspecific abnormal finding of lung field: Secondary | ICD-10-CM

## 2015-03-30 NOTE — Patient Instructions (Signed)
Follow up in 6 months with chest xray

## 2015-03-30 NOTE — Progress Notes (Signed)
Chief Complaint  Patient presents with  . Follow-up    Pt state that his SOB has been doing okay since last visit. No complaints.     History of Present Illness: Jesse Garrison is a 69 y.o. male former smoker with lung mass, and COPD/emphysema.  He denies cough, wheeze, sputum, chest pain, fever, hemoptysis, or dyspnea.  He has to use a walker to get around.    Tests: PFT 04/03/13 >> FEV1 1.42 (52%), FEV1% 49, TLC 5.78 (87%), DLCO 44% CT chest 02/18/14 >> centrilobular/ paraseptal emphysema, 4.2 cm RUL mass, 2.3 cm LUL mass PET scan 03/22/14 >> 5.4 cm RUL mass 9.2 SUV  PMHx >> HTN, HLD, BPH, DM  PSHx, Medications, Allergies, Fhx, Shx reviewed.  Physical Exam: Blood pressure 142/72, pulse 88, height '5\' 9"'$  (1.753 m), weight 265 lb (120.203 kg), SpO2 90 %. Body mass index is 39.12 kg/(m^2).  General - No distress ENT - No sinus tenderness, no oral exudate, no LAN Cardiac - s1s2 regular, no murmur, pulses symmetric Chest - No wheeze/rales Back - No focal tenderness Abd - Soft, non-tender Ext - No edema Neuro - Normal strength Skin - No rashes Psych - Normal mood, and behavior  Assessment/Plan:  Lung mass. Discussion: Concern is still that he could have malignancy.  His CXR from April 2016 showed lesions was actually smaller compared to previous imaging. Plan: - he prefers conservative strategy - will repeat CXR at next visit  COPD with emphysema. Plan: - continue breo and daliresp - continue prn albuterol  DOT clearance. 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

## 2015-05-17 ENCOUNTER — Ambulatory Visit (INDEPENDENT_AMBULATORY_CARE_PROVIDER_SITE_OTHER): Payer: Medicare HMO

## 2015-05-17 DIAGNOSIS — Z23 Encounter for immunization: Secondary | ICD-10-CM | POA: Diagnosis not present

## 2015-08-03 ENCOUNTER — Ambulatory Visit: Payer: Medicare HMO | Admitting: Internal Medicine

## 2015-08-04 ENCOUNTER — Ambulatory Visit (INDEPENDENT_AMBULATORY_CARE_PROVIDER_SITE_OTHER)
Admission: RE | Admit: 2015-08-04 | Discharge: 2015-08-04 | Disposition: A | Discharge status: Home / Self Care | Payer: Medicare HMO | Source: Ambulatory Visit | Attending: Internal Medicine | Admitting: Internal Medicine

## 2015-08-04 ENCOUNTER — Other Ambulatory Visit (INDEPENDENT_AMBULATORY_CARE_PROVIDER_SITE_OTHER): Payer: Medicare HMO

## 2015-08-04 ENCOUNTER — Encounter: Payer: Self-pay | Admitting: Internal Medicine

## 2015-08-04 ENCOUNTER — Ambulatory Visit (INDEPENDENT_AMBULATORY_CARE_PROVIDER_SITE_OTHER): Payer: Medicare HMO | Admitting: Internal Medicine

## 2015-08-04 VITALS — BP 130/78 | HR 73 | Temp 98.3°F | Resp 16 | Ht 69.0 in | Wt 270.0 lb

## 2015-08-04 DIAGNOSIS — I10 Essential (primary) hypertension: Secondary | ICD-10-CM

## 2015-08-04 DIAGNOSIS — N400 Enlarged prostate without lower urinary tract symptoms: Secondary | ICD-10-CM | POA: Diagnosis not present

## 2015-08-04 DIAGNOSIS — E118 Type 2 diabetes mellitus with unspecified complications: Secondary | ICD-10-CM

## 2015-08-04 DIAGNOSIS — N410 Acute prostatitis: Secondary | ICD-10-CM

## 2015-08-04 DIAGNOSIS — R918 Other nonspecific abnormal finding of lung field: Secondary | ICD-10-CM | POA: Diagnosis not present

## 2015-08-04 DIAGNOSIS — G8929 Other chronic pain: Secondary | ICD-10-CM | POA: Insufficient documentation

## 2015-08-04 DIAGNOSIS — Z23 Encounter for immunization: Secondary | ICD-10-CM

## 2015-08-04 DIAGNOSIS — Z0000 Encounter for general adult medical examination without abnormal findings: Secondary | ICD-10-CM | POA: Diagnosis not present

## 2015-08-04 DIAGNOSIS — M25561 Pain in right knee: Secondary | ICD-10-CM

## 2015-08-04 DIAGNOSIS — E785 Hyperlipidemia, unspecified: Secondary | ICD-10-CM

## 2015-08-04 DIAGNOSIS — M25569 Pain in unspecified knee: Secondary | ICD-10-CM

## 2015-08-04 LAB — URINALYSIS, ROUTINE W REFLEX MICROSCOPIC
BILIRUBIN URINE: NEGATIVE
HGB URINE DIPSTICK: NEGATIVE
Ketones, ur: NEGATIVE
NITRITE: NEGATIVE
RBC / HPF: NONE SEEN (ref 0–?)
Specific Gravity, Urine: 1.02 (ref 1.000–1.030)
TOTAL PROTEIN, URINE-UPE24: NEGATIVE
URINE GLUCOSE: NEGATIVE
Urobilinogen, UA: 0.2 (ref 0.0–1.0)
pH: 5.5 (ref 5.0–8.0)

## 2015-08-04 LAB — LIPID PANEL
Cholesterol: 198 mg/dL (ref 0–200)
HDL: 40.1 mg/dL (ref >39.00)
LDL CALC: 141 mg/dL — AB (ref 0–99)
NONHDL: 157.79
Total CHOL/HDL Ratio: 5
Triglycerides: 84 mg/dL (ref 0.0–149.0)
VLDL: 16.8 mg/dL (ref 0.0–40.0)

## 2015-08-04 LAB — COMPREHENSIVE METABOLIC PANEL
ALK PHOS: 62 U/L (ref 39–117)
ALT: 16 U/L (ref 0–53)
AST: 15 U/L (ref 0–37)
Albumin: 4.1 g/dL (ref 3.5–5.2)
BILIRUBIN TOTAL: 0.6 mg/dL (ref 0.2–1.2)
BUN: 14 mg/dL (ref 6–23)
CALCIUM: 10.2 mg/dL (ref 8.4–10.5)
CHLORIDE: 104 meq/L (ref 96–112)
CO2: 30 meq/L (ref 19–32)
CREATININE: 1.25 mg/dL (ref 0.40–1.50)
GFR: 73.39 mL/min (ref >60.00)
GLUCOSE: 149 mg/dL — AB (ref 70–99)
Potassium: 4.5 mEq/L (ref 3.5–5.1)
Sodium: 140 mEq/L (ref 135–145)
Total Protein: 6.9 g/dL (ref 6.0–8.3)

## 2015-08-04 LAB — PSA: PSA: 4.5 ng/mL — AB (ref 0.10–4.00)

## 2015-08-04 LAB — CBC WITH DIFFERENTIAL/PLATELET
BASOS ABS: 0.1 10*3/uL (ref 0.0–0.1)
Basophils Relative: 0.5 % (ref 0.0–3.0)
EOS ABS: 0.4 10*3/uL (ref 0.0–0.7)
Eosinophils Relative: 3 % (ref 0.0–5.0)
HCT: 43.6 % (ref 39.0–52.0)
HEMOGLOBIN: 14.2 g/dL (ref 13.0–17.0)
LYMPHS ABS: 1.9 10*3/uL (ref 0.7–4.0)
Lymphocytes Relative: 15.6 % (ref 12.0–46.0)
MCHC: 32.5 g/dL (ref 30.0–36.0)
MCV: 79.9 fl (ref 78.0–100.0)
MONO ABS: 0.9 10*3/uL (ref 0.1–1.0)
Monocytes Relative: 7 % (ref 3.0–12.0)
NEUTROS PCT: 73.9 % (ref 43.0–77.0)
Neutro Abs: 9.2 10*3/uL — ABNORMAL HIGH (ref 1.4–7.7)
Platelets: 208 10*3/uL (ref 150.0–400.0)
RBC: 5.46 Mil/uL (ref 4.22–5.81)
RDW: 15.8 % — ABNORMAL HIGH (ref 11.5–15.5)
WBC: 12.4 10*3/uL — AB (ref 4.0–10.5)

## 2015-08-04 LAB — MICROALBUMIN / CREATININE URINE RATIO
Creatinine,U: 142.8 mg/dL
Microalb Creat Ratio: 0.9 mg/g (ref 0.0–30.0)
Microalb, Ur: 1.3 mg/dL (ref 0.0–1.9)

## 2015-08-04 LAB — TSH: TSH: 0.72 u[IU]/mL (ref 0.35–4.50)

## 2015-08-04 LAB — GLUCOSE, POCT (MANUAL RESULT ENTRY): POC GLUCOSE: 167 mg/dL — AB (ref 70–99)

## 2015-08-04 LAB — HEMOGLOBIN A1C: HEMOGLOBIN A1C: 8.2 % — AB (ref 4.6–6.5)

## 2015-08-04 LAB — FECAL OCCULT BLOOD, GUAIAC: Fecal Occult Blood: NEGATIVE

## 2015-08-04 MED ORDER — SITAGLIP PHOS-METFORMIN HCL ER 50-1000 MG PO TB24: 1.0000 | ORAL_TABLET | Freq: Every day | ORAL | Status: DC

## 2015-08-04 NOTE — Progress Notes (Signed)
Pre visit review using our clinic review tool, if applicable. No additional management support is needed unless otherwise documented below in the visit note. 

## 2015-08-04 NOTE — Patient Instructions (Signed)

## 2015-08-04 NOTE — Progress Notes (Signed)
Subjective:  Patient ID: Jesse Garrison, male    DOB: [birth year]  Age: 69 y.o. MRN: [id number]  CC: COPD; Hypertension; Hyperlipidemia; and Annual Exam   HPI Jesse Garrison presents for a complete physical and follow-up on his multiple medical problems. He has quite a few complaints today as well. He has not been taking his medication for diabetes. He has not been taking a cholesterol medicine. He ran out of his inhaler Breo and needs a refill.  Outpatient Prescriptions Prior to Visit  Medication Sig Dispense Refill  . albuterol (PROVENTIL HFA;VENTOLIN HFA) 108 (90 BASE) MCG/ACT inhaler Inhale 2 puffs into the lungs every 6 (six) hours as needed.    Marland Kitchen amLODipine (NORVASC) 5 MG tablet Take 1 tablet (5 mg total) by mouth daily. 90 tablet 3  . Fluticasone Furoate-Vilanterol (BREO ELLIPTA) 100-25 MCG/INH AEPB Inhale 1 Inhaler into the lungs every morning. 28 each 11  . losartan (COZAAR) 100 MG tablet Take 1 tablet (100 mg total) by mouth daily. 90 tablet 3  . Multiple Vitamins-Minerals (CENTRUM SILVER PO) Take 1 tablet by mouth daily.    . Omega-3 Krill Oil 1000 MG CAPS Take 1 tablet by mouth daily.    Marland Kitchen terazosin (HYTRIN) 5 MG capsule Take 5 mg by mouth at bedtime.      . Pitavastatin Calcium (LIVALO) 4 MG TABS Take 1 tablet (4 mg total) by mouth daily. 28 tablet 0  . roflumilast (DALIRESP) 500 MCG TABS tablet Take 1 tablet (500 mcg total) by mouth daily. 30 tablet 0  . SitaGLIPtin-MetFORMIN HCl (JANUMET XR) 50-1000 MG TB24 Take 1 tablet by mouth daily. 90 tablet 3   No facility-administered medications prior to visit.    ROS Review of Systems  Constitutional: Positive for unexpected weight change (wt gain). Negative for fever, chills, diaphoresis, activity change, appetite change and fatigue.  HENT: Negative.   Eyes: Negative.  Negative for visual disturbance.  Respiratory: Positive for shortness of breath. Negative for apnea, cough, choking, chest tightness, wheezing and stridor.     Cardiovascular: Negative.  Negative for chest pain, palpitations and leg swelling.  Gastrointestinal: Negative.  Negative for nausea, vomiting, abdominal pain, diarrhea, constipation and blood in stool.  Endocrine: Positive for polydipsia, polyphagia and polyuria.  Genitourinary: Positive for dysuria. Negative for urgency, frequency, hematuria, flank pain, decreased urine volume, discharge, penile swelling, scrotal swelling, enuresis, difficulty urinating, genital sores, penile pain and testicular pain.  Musculoskeletal: Positive for arthralgias (chronic, worsening right knee pain). Negative for myalgias and back pain.  Skin: Negative.   Allergic/Immunologic: Negative.   Neurological: Negative.  Negative for dizziness, syncope, facial asymmetry, speech difficulty, weakness, light-headedness and numbness.  Hematological: Negative.  Negative for adenopathy. Does not bruise/bleed easily.  Psychiatric/Behavioral: Negative.     Objective:  BP 130/78 mmHg  Pulse 73  Temp(Src) 98.3 F (36.8 C) (Oral)  Resp 16  Ht '5\' 9"'$  (1.753 m)  Wt 270 lb (122.471 kg)  BMI 39.85 kg/m2  SpO2 95%  BP Readings from Last 3 Encounters:  08/04/15 130/78  03/30/15 134/60  01/28/15 138/88    Wt Readings from Last 3 Encounters:  08/04/15 270 lb (122.471 kg)  03/30/15 265 lb (120.203 kg)  01/28/15 262 lb 12.8 oz (119.205 kg)    Physical Exam  Constitutional: He is oriented to person, place, and time. He appears well-developed and well-nourished. No distress.  HENT:  Head: Normocephalic and atraumatic.  Mouth/Throat: Oropharynx is clear and moist. No oropharyngeal exudate.  Eyes: Conjunctivae are normal. Right  eye exhibits no discharge. Left eye exhibits no discharge. No scleral icterus.  Neck: Normal range of motion. Neck supple. No JVD present. No tracheal deviation present. No thyromegaly present.  Cardiovascular: Normal rate, regular rhythm, normal heart sounds and intact distal pulses.  Exam reveals  no gallop and no friction rub.   No murmur heard. Pulmonary/Chest: Effort normal and breath sounds normal. No stridor. No respiratory distress. He has no wheezes. He has no rales. He exhibits no tenderness.  Abdominal: Soft. Bowel sounds are normal. He exhibits no distension and no mass. There is no tenderness. There is no rebound and no guarding. Hernia confirmed negative in the right inguinal area.  Genitourinary: Rectum normal, testes normal and penis normal. Rectal exam shows no external hemorrhoid, no internal hemorrhoid, no fissure, no mass, no tenderness and anal tone normal. Guaiac negative stool. Prostate is enlarged (2+ BPH with diffuse bogginess). Prostate is not tender. Right testis shows no mass, no swelling and no tenderness. Right testis is descended. Left testis shows no mass, no swelling and no tenderness. Left testis is descended. Circumcised. No penile erythema or penile tenderness. No discharge found.  Musculoskeletal: Normal range of motion. He exhibits no edema or tenderness.  Right knee shows DJD and mild swelling.  Lymphadenopathy:    He has no cervical adenopathy.       Right: No inguinal adenopathy present.       Left: No inguinal adenopathy present.  Neurological: He is oriented to person, place, and time.  Skin: Skin is warm and dry. No rash noted. He is not diaphoretic. No erythema. No pallor.  Psychiatric: He has a normal mood and affect. His behavior is normal. Judgment and thought content normal.  Vitals reviewed.   Lab Results  Component Value Date   WBC 12.4* 08/04/2015   HGB 14.2 08/04/2015   HCT 43.6 08/04/2015   PLT 208.0 08/04/2015   GLUCOSE 149* 08/04/2015   CHOL 198 08/04/2015   TRIG 84.0 08/04/2015   HDL 40.10 08/04/2015   LDLCALC 141* 08/04/2015   ALT 16 08/04/2015   AST 15 08/04/2015   NA 140 08/04/2015   K 4.5 08/04/2015   CL 104 08/04/2015   CREATININE 1.25 08/04/2015   BUN 14 08/04/2015   CO2 30 08/04/2015   TSH 0.72 08/04/2015   PSA  4.50* 08/04/2015   HGBA1C 8.2* 08/04/2015   MICROALBUR 1.3 08/04/2015    Dg Chest 2 View  12/07/2014  CLINICAL DATA:  Follow-up lung mass.  No chest complaints. 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 There are bilateral emphysematous changes. There is no pleural effusion, pneumothorax or focal consolidation. The heart and mediastinum are stable. The osseous structures are unremarkable. IMPRESSION: No active cardiopulmonary disease. 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 Electronically Signed   By: Kathreen Devoid   On: 12/07/2014 15:58    Assessment & Plan:   Elyan was seen today for copd, hypertension, hyperlipidemia and annual exam.  Diagnoses and all orders for this visit:  Mass of lung- there are no changes noted on today's x-ray, he is due for follow-up with pulmonology -     DG Chest 2 View; Future -     Ambulatory referral to Pulmonology  Essential hypertension- his blood pressure is well-controlled, electrolytes and renal function are stable -     Urinalysis, Routine  w reflex microscopic (not at [facility]); Future -     Comprehensive metabolic panel; Future -     CBC with Differential/Platelet; Future  Type 2 diabetes mellitus with complication, without long-term current use of insulin ([place])- his A1c is up to 8.2%, I have asked him to restart Janumet -     Lipid panel; Future -     Microalbumin / creatinine urine ratio; Future -     Hemoglobin A1c; Future -     Comprehensive metabolic panel; Future -     SitaGLIPtin-MetFORMIN HCl (JANUMET XR) 50-1000 MG TB24; Take 1 tablet by mouth daily. -     Ambulatory referral to Ophthalmology -     POCT Glucose (CBG) -     pravastatin (PRAVACHOL) 40 MG tablet; Take 1 tablet (40 mg total) by mouth daily.  BPH (benign prostatic hyperplasia)- his PSA has gone up a little bit  but he also has some Chargois blood cells in his urine and signs and symptoms consistent with acute bacterial prostatitis. -     PSA; Future  Hyperlipidemia with target LDL less than 100- he has not been taking a statin and his LDL cholesterol is too high, I have asked him to restart statin therapy. -     Lipid panel; Future -     TSH; Future -     Comprehensive metabolic panel; Future -     pravastatin (PRAVACHOL) 40 MG tablet; Take 1 tablet (40 mg total) by mouth daily.  Need for vaccination with 13-polyvalent pneumococcal conjugate vaccine -     Pneumococcal conjugate vaccine 13-valent  Knee pain, chronic, right -     Ambulatory referral to Orthopedic Surgery  Prostatitis, acute- will treat the infection with Cipro -     ciprofloxacin (CIPRO) 500 MG tablet; Take 1 tablet (500 mg total) by mouth 2 (two) times daily.   I have discontinued Jesse Garrison's roflumilast and Pitavastatin Calcium. I am also having him start on ciprofloxacin and pravastatin. Additionally, I am having him maintain his terazosin, albuterol, Multiple Vitamins-Minerals (CENTRUM SILVER PO), Fluticasone Furoate-Vilanterol, amLODipine, losartan, Omega-3 Krill Oil, and SitaGLIPtin-MetFORMIN HCl.  Meds ordered this encounter  Medications  . SitaGLIPtin-MetFORMIN HCl (JANUMET XR) 50-1000 MG TB24    Sig: Take 1 tablet by mouth daily.    Dispense:  90 tablet    Refill:  3  . ciprofloxacin (CIPRO) 500 MG tablet    Sig: Take 1 tablet (500 mg total) by mouth 2 (two) times daily.    Dispense:  60 tablet    Refill:  1  . pravastatin (PRAVACHOL) 40 MG tablet    Sig: Take 1 tablet (40 mg total) by mouth daily.    Dispense:  30 tablet    Refill:  11   See AVS for instructions about healthy living and anticipatory guidance.  Follow-up: Return in about 4 months (around 12/03/2015).  Scarlette Calico, MD

## 2015-08-05 ENCOUNTER — Encounter: Payer: Self-pay | Admitting: Internal Medicine

## 2015-08-05 DIAGNOSIS — N410 Acute prostatitis: Secondary | ICD-10-CM | POA: Insufficient documentation

## 2015-08-05 MED ORDER — CIPROFLOXACIN HCL 500 MG PO TABS: 500.0000 mg | ORAL_TABLET | Freq: Two times a day (BID) | ORAL | Status: DC

## 2015-08-05 MED ORDER — PRAVASTATIN SODIUM 40 MG PO TABS: 40.0000 mg | ORAL_TABLET | Freq: Every day | ORAL | Status: DC

## 2015-08-05 NOTE — Assessment & Plan Note (Signed)

## 2015-08-23 DIAGNOSIS — E119 Type 2 diabetes mellitus without complications: Secondary | ICD-10-CM | POA: Diagnosis not present

## 2015-08-23 LAB — HM DIABETES EYE EXAM

## 2015-08-24 ENCOUNTER — Encounter: Payer: Self-pay | Admitting: Internal Medicine

## 2015-08-24 DIAGNOSIS — M1711 Unilateral primary osteoarthritis, right knee: Secondary | ICD-10-CM | POA: Diagnosis not present

## 2015-10-26 DIAGNOSIS — M25561 Pain in right knee: Secondary | ICD-10-CM | POA: Diagnosis not present

## 2015-10-26 DIAGNOSIS — M1711 Unilateral primary osteoarthritis, right knee: Secondary | ICD-10-CM | POA: Diagnosis not present

## 2015-10-26 DIAGNOSIS — M25562 Pain in left knee: Secondary | ICD-10-CM | POA: Diagnosis not present

## 2015-10-26 DIAGNOSIS — M170 Bilateral primary osteoarthritis of knee: Secondary | ICD-10-CM | POA: Diagnosis not present

## 2015-10-30 DIAGNOSIS — E669 Obesity, unspecified: Secondary | ICD-10-CM | POA: Diagnosis not present

## 2015-10-30 DIAGNOSIS — E785 Hyperlipidemia, unspecified: Secondary | ICD-10-CM | POA: Diagnosis not present

## 2015-10-30 DIAGNOSIS — Z6838 Body mass index (BMI) 38.0-38.9, adult: Secondary | ICD-10-CM | POA: Diagnosis not present

## 2015-10-30 DIAGNOSIS — I10 Essential (primary) hypertension: Secondary | ICD-10-CM | POA: Diagnosis not present

## 2015-10-30 DIAGNOSIS — M1710 Unilateral primary osteoarthritis, unspecified knee: Secondary | ICD-10-CM | POA: Diagnosis not present

## 2015-10-30 DIAGNOSIS — E119 Type 2 diabetes mellitus without complications: Secondary | ICD-10-CM | POA: Diagnosis not present

## 2015-11-08 DIAGNOSIS — M1711 Unilateral primary osteoarthritis, right knee: Secondary | ICD-10-CM | POA: Diagnosis not present

## 2015-11-08 DIAGNOSIS — R2689 Other abnormalities of gait and mobility: Secondary | ICD-10-CM | POA: Diagnosis not present

## 2015-11-08 DIAGNOSIS — M25561 Pain in right knee: Secondary | ICD-10-CM | POA: Diagnosis not present

## 2015-11-15 ENCOUNTER — Ambulatory Visit: Payer: Medicare HMO | Admitting: Pulmonary Disease

## 2015-11-15 DIAGNOSIS — M1711 Unilateral primary osteoarthritis, right knee: Secondary | ICD-10-CM | POA: Diagnosis not present

## 2015-11-15 DIAGNOSIS — M25561 Pain in right knee: Secondary | ICD-10-CM | POA: Diagnosis not present

## 2015-11-22 DIAGNOSIS — M1711 Unilateral primary osteoarthritis, right knee: Secondary | ICD-10-CM | POA: Diagnosis not present

## 2015-11-22 DIAGNOSIS — M25561 Pain in right knee: Secondary | ICD-10-CM | POA: Diagnosis not present

## 2015-11-29 DIAGNOSIS — M1711 Unilateral primary osteoarthritis, right knee: Secondary | ICD-10-CM | POA: Diagnosis not present

## 2015-11-29 DIAGNOSIS — M25561 Pain in right knee: Secondary | ICD-10-CM | POA: Diagnosis not present

## 2015-12-06 DIAGNOSIS — M1711 Unilateral primary osteoarthritis, right knee: Secondary | ICD-10-CM | POA: Diagnosis not present

## 2015-12-06 DIAGNOSIS — M25561 Pain in right knee: Secondary | ICD-10-CM | POA: Diagnosis not present

## 2015-12-13 ENCOUNTER — Other Ambulatory Visit: Payer: Self-pay | Admitting: Internal Medicine

## 2015-12-14 NOTE — Telephone Encounter (Signed)
Pt informed. He thought this was a eminence medication. Scheduled CPE on 01/02/16

## 2016-01-02 ENCOUNTER — Ambulatory Visit (INDEPENDENT_AMBULATORY_CARE_PROVIDER_SITE_OTHER): Payer: Commercial Managed Care - HMO | Admitting: Internal Medicine

## 2016-01-02 ENCOUNTER — Other Ambulatory Visit (INDEPENDENT_AMBULATORY_CARE_PROVIDER_SITE_OTHER): Payer: Commercial Managed Care - HMO

## 2016-01-02 ENCOUNTER — Encounter: Payer: Self-pay | Admitting: Internal Medicine

## 2016-01-02 VITALS — BP 156/94 | HR 83 | Temp 98.3°F | Resp 16 | Ht 69.0 in | Wt 262.0 lb

## 2016-01-02 DIAGNOSIS — R0609 Other forms of dyspnea: Secondary | ICD-10-CM

## 2016-01-02 DIAGNOSIS — E118 Type 2 diabetes mellitus with unspecified complications: Secondary | ICD-10-CM

## 2016-01-02 DIAGNOSIS — N400 Enlarged prostate without lower urinary tract symptoms: Secondary | ICD-10-CM

## 2016-01-02 DIAGNOSIS — Z0000 Encounter for general adult medical examination without abnormal findings: Secondary | ICD-10-CM | POA: Diagnosis not present

## 2016-01-02 DIAGNOSIS — R918 Other nonspecific abnormal finding of lung field: Secondary | ICD-10-CM | POA: Diagnosis not present

## 2016-01-02 DIAGNOSIS — I10 Essential (primary) hypertension: Secondary | ICD-10-CM

## 2016-01-02 DIAGNOSIS — K130 Diseases of lips: Secondary | ICD-10-CM

## 2016-01-02 DIAGNOSIS — J439 Emphysema, unspecified: Secondary | ICD-10-CM

## 2016-01-02 LAB — COMPREHENSIVE METABOLIC PANEL
ALBUMIN: 4.1 g/dL (ref 3.5–5.2)
ALT: 14 U/L (ref 0–53)
AST: 14 U/L (ref 0–37)
Alkaline Phosphatase: 50 U/L (ref 39–117)
BUN: 13 mg/dL (ref 6–23)
CHLORIDE: 110 meq/L (ref 96–112)
CO2: 24 meq/L (ref 19–32)
CREATININE: 1.18 mg/dL (ref 0.40–1.50)
Calcium: 9.3 mg/dL (ref 8.4–10.5)
GFR: 78.34 mL/min (ref >60.00)
GLUCOSE: 91 mg/dL (ref 70–99)
POTASSIUM: 4.4 meq/L (ref 3.5–5.1)
SODIUM: 141 meq/L (ref 135–145)
Total Bilirubin: 0.5 mg/dL (ref 0.2–1.2)
Total Protein: 6.7 g/dL (ref 6.0–8.3)

## 2016-01-02 LAB — CBC WITH DIFFERENTIAL/PLATELET
BASOS PCT: 0.2 % (ref 0.0–3.0)
Basophils Absolute: 0 10*3/uL (ref 0.0–0.1)
EOS PCT: 2.7 % (ref 0.0–5.0)
Eosinophils Absolute: 0.3 10*3/uL (ref 0.0–0.7)
HEMATOCRIT: 41.9 % (ref 39.0–52.0)
HEMOGLOBIN: 13.7 g/dL (ref 13.0–17.0)
Lymphocytes Relative: 12.1 % (ref 12.0–46.0)
Lymphs Abs: 1.6 10*3/uL (ref 0.7–4.0)
MCHC: 32.7 g/dL (ref 30.0–36.0)
MCV: 77.9 fl — ABNORMAL LOW (ref 78.0–100.0)
MONO ABS: 0.8 10*3/uL (ref 0.1–1.0)
Monocytes Relative: 6.2 % (ref 3.0–12.0)
Neutro Abs: 10.2 10*3/uL — ABNORMAL HIGH (ref 1.4–7.7)
Neutrophils Relative %: 78.8 % — ABNORMAL HIGH (ref 43.0–77.0)
Platelets: 190 10*3/uL (ref 150.0–400.0)
RBC: 5.38 Mil/uL (ref 4.22–5.81)
RDW: 16.3 % — ABNORMAL HIGH (ref 11.5–15.5)
WBC: 12.9 10*3/uL — ABNORMAL HIGH (ref 4.0–10.5)

## 2016-01-02 LAB — PSA: PSA: 2.92 ng/mL (ref 0.10–4.00)

## 2016-01-02 LAB — CARDIAC PANEL
CK MB: 1.5 ng/mL (ref 0.3–4.0)
CK TOTAL: 88 U/L (ref 7–232)
RELATIVE INDEX: 1.7 calc (ref 0.0–2.5)

## 2016-01-02 LAB — HEMOGLOBIN A1C: HEMOGLOBIN A1C: 6.9 % — AB (ref 4.6–6.5)

## 2016-01-02 LAB — TROPONIN I: TNIDX: 0 ug/l (ref 0.00–0.06)

## 2016-01-02 LAB — BRAIN NATRIURETIC PEPTIDE: Pro B Natriuretic peptide (BNP): 22 pg/mL (ref 0.0–100.0)

## 2016-01-02 MED ORDER — NEBIVOLOL HCL 5 MG PO TABS: 5.0000 mg | ORAL_TABLET | Freq: Every day | ORAL | Status: DC

## 2016-01-02 MED ORDER — AMLODIPINE BESYLATE 5 MG PO TABS: 5.0000 mg | ORAL_TABLET | Freq: Every day | ORAL | Status: DC

## 2016-01-02 MED ORDER — LOSARTAN POTASSIUM 100 MG PO TABS
100.0000 mg | ORAL_TABLET | Freq: Every day | ORAL | Status: DC
Start: 2016-01-02 — End: 2017-01-04

## 2016-01-02 MED ORDER — SITAGLIP PHOS-METFORMIN HCL ER 50-1000 MG PO TB24: 1.0000 | ORAL_TABLET | Freq: Every day | ORAL | Status: DC

## 2016-01-02 NOTE — Progress Notes (Signed)
Subjective:  Patient ID: Jesse Garrison, male    DOB: 21-Mar-1946  Age: 70 y.o. MRN: [id number]  CC: Annual Exam; Hyperlipidemia; Hypertension; and Diabetes   HPI Jesse Garrison presents for a CPX.  He complains of recurrent episodes of DOE over the last 2-3 years, these symptoms have not recently worsened. He denies CP, diaphoresis, palpitations, syncope, edema, or fatigue.  He does not think his blood sugars are well controlled as he has not been taking Janumet.   His BP has not been well controlled either but he reports compliance with his 2 current antihypertensives.  Past Medical History  Diagnosis Date  . Hypertension   . Hyperlipidemia   . COPD (chronic obstructive pulmonary disease) ([place])   . BPH (benign prostatic hyperplasia)   . Osteoarthritis   . Diabetes ([place])   . Personal history of colonic adenomas 05/08/2013   Past Surgical History  Procedure Laterality Date  . Fatty tissue removal  1996    forehead    reports that he quit smoking about 4 years ago. His smoking use included Cigarettes. He has a 50 pack-year smoking history. He has never used smokeless tobacco. He reports that he drinks about 0.6 oz of alcohol per week. He reports that he does not use illicit drugs. family history includes Diabetes in his other; Emphysema in his mother. There is no history of Colon cancer, Esophageal cancer, Rectal cancer, or Stomach cancer. No Known Allergies  Outpatient Prescriptions Prior to Visit  Medication Sig Dispense Refill  . albuterol (PROVENTIL HFA;VENTOLIN HFA) 108 (90 BASE) MCG/ACT inhaler Inhale 2 puffs into the lungs every 6 (six) hours as needed.    . Fluticasone Furoate-Vilanterol (BREO ELLIPTA) 100-25 MCG/INH AEPB Inhale 1 Inhaler into the lungs every morning. 28 each 11  . Multiple Vitamins-Minerals (CENTRUM SILVER PO) Take 1 tablet by mouth daily.    . Omega-3 Krill Oil 1000 MG CAPS Take 1 tablet by mouth daily.    . pravastatin (PRAVACHOL) 40 MG tablet Take 1  tablet (40 mg total) by mouth daily. 30 tablet 11  . terazosin (HYTRIN) 5 MG capsule Take 5 mg by mouth at bedtime.      . ciprofloxacin (CIPRO) 500 MG tablet Take 1 tablet (500 mg total) by mouth 2 (two) times daily. 60 tablet 1  . SitaGLIPtin-MetFORMIN HCl (JANUMET XR) 50-1000 MG TB24 Take 1 tablet by mouth daily. 90 tablet 3  . amLODipine (NORVASC) 5 MG tablet Take 1 tablet (5 mg total) by mouth daily. 90 tablet 3  . losartan (COZAAR) 100 MG tablet Take 1 tablet (100 mg total) by mouth daily. 90 tablet 3   No facility-administered medications prior to visit.    ROS Review of Systems  Constitutional: Negative.  Negative for chills, diaphoresis, activity change, fatigue and unexpected weight change.  HENT: Negative.        He complains of a lesion on his left lower lip that he has noticed for several months  Eyes: Negative.  Negative for visual disturbance.  Respiratory: Positive for shortness of breath. Negative for cough, choking, chest tightness, wheezing and stridor.   Cardiovascular: Negative.  Negative for chest pain, palpitations and leg swelling.  Gastrointestinal: Negative.  Negative for nausea, vomiting, abdominal pain, diarrhea, constipation and blood in stool.  Endocrine: Negative.  Negative for polydipsia, polyphagia and polyuria.  Genitourinary: Negative.  Negative for difficulty urinating.  Musculoskeletal: Negative.  Negative for myalgias, back pain, joint swelling and arthralgias.  Skin: Negative.  Negative for color change  and rash.  Allergic/Immunologic: Negative.   Neurological: Negative.  Negative for dizziness, tremors, syncope, light-headedness, numbness and headaches.  Hematological: Negative.  Negative for adenopathy. Does not bruise/bleed easily.  Psychiatric/Behavioral: Negative.     Objective:  BP 156/94 mmHg  Pulse 83  Temp(Src) 98.3 F (36.8 C) (Oral)  Resp 16  Ht '5\' 9"'$  (1.753 m)  Wt 262 lb (118.842 kg)  BMI 38.67 kg/m2  SpO2 93%  BP Readings  from Last 3 Encounters:  01/02/16 156/94  08/04/15 130/78  03/30/15 134/60    Wt Readings from Last 3 Encounters:  01/02/16 262 lb (118.842 kg)  08/04/15 270 lb (122.471 kg)  03/30/15 265 lb (120.203 kg)    Physical Exam  Constitutional: He is oriented to person, place, and time. No distress.  HENT:  Mouth/Throat: Mucous membranes are normal. Oral lesions present. No oropharyngeal exudate, posterior oropharyngeal edema, posterior oropharyngeal erythema or tonsillar abscesses.  inside his left lower lip there is a raised, Holst lesion that measures about 4 mm.   Eyes: Conjunctivae are normal. Right eye exhibits no discharge. Left eye exhibits no discharge. No scleral icterus.  Neck: Normal range of motion. Neck supple. No JVD present. No tracheal deviation present. No thyromegaly present.  Cardiovascular: Normal rate, regular rhythm, normal heart sounds and intact distal pulses.  Exam reveals no gallop and no friction rub.   No murmur heard. EKG ----   Sinus  Rhythm  - frequent ectopic ventricular beat s  # VECs = 2 BORDERLINE RHYTHM  Pulmonary/Chest: Effort normal and breath sounds normal. No stridor. No respiratory distress. He has no wheezes. He has no rales. He exhibits no tenderness.  Abdominal: Soft. Bowel sounds are normal. He exhibits no distension and no mass. There is no tenderness. There is no rebound and no guarding.  Musculoskeletal: Normal range of motion. He exhibits no edema or tenderness.  Lymphadenopathy:    He has no cervical adenopathy.  Neurological: He is oriented to person, place, and time.  Skin: Skin is warm and dry. No rash noted. He is not diaphoretic. No erythema. No pallor.  Vitals reviewed.   Lab Results  Component Value Date   WBC 12.9* 01/02/2016   HGB 13.7 01/02/2016   HCT 41.9 01/02/2016   PLT 190.0 01/02/2016   GLUCOSE 91 01/02/2016   CHOL 198 08/04/2015   TRIG 84.0 08/04/2015   HDL 40.10 08/04/2015   LDLCALC 141* 08/04/2015   ALT 14  01/02/2016   AST 14 01/02/2016   NA 141 01/02/2016   K 4.4 01/02/2016   CL 110 01/02/2016   CREATININE 1.18 01/02/2016   BUN 13 01/02/2016   CO2 24 01/02/2016   TSH 0.72 08/04/2015   PSA 2.92 01/02/2016   HGBA1C 6.9* 01/02/2016   MICROALBUR 1.3 08/04/2015    Dg Chest 2 View  08/04/2015  CLINICAL DATA:  History of lung mass, COPD, previous smoker, currently asymptomatic. EXAM: CHEST  2 VIEW COMPARISON:  Chest x-ray of December 06, 2008 FINDINGS: There is a stable spiculated mass in the right upper lobe measuring approximately 3 cm in greatest dimension. The interstitial markings of both lungs are coarse. There is no alveolar infiltrate. There is no pleural effusion. There are faintly calcified lymph nodes in the right hilum. The heart and pulmonary vascularity are normal. The observed bony thorax is unremarkable. IMPRESSION: Stable appearing spiculated 3 cm right upper lobe mass. Stable chronic interstitial change bilaterally. There is no acute pneumonia. Electronically Signed   By: David  Martinique  M.D.   On: 08/04/2015 13:57    Assessment & Plan:   Farah was seen today for annual exam, hyperlipidemia, hypertension and diabetes.  Diagnoses and all orders for this visit:  Essential hypertension- His blood pressure is not adequately well controlled on the combination of amlodipine and losartan, I've asked him to add nebivolol, will screen his labs for secondary causes of hypertension and end organ damage. -     amLODipine (NORVASC) 5 MG tablet; Take 1 tablet (5 mg total) by mouth daily. -     losartan (COZAAR) 100 MG tablet; Take 1 tablet (100 mg total) by mouth daily. -     Comprehensive metabolic panel; Future -     CBC with Differential/Platelet; Future -     nebivolol (BYSTOLIC) 5 MG tablet; Take 1 tablet (5 mg total) by mouth daily.  Type 2 diabetes mellitus with complication, without long-term current use of insulin ([place])- recent A1c was 6.9%, blood sugars are well-controlled on the  current regimen of sitagliptin metformin -     losartan (COZAAR) 100 MG tablet; Take 1 tablet (100 mg total) by mouth daily. -     Comprehensive metabolic panel; Future -     Hemoglobin A1c; Future -     SitaGLIPtin-MetFORMIN HCl (JANUMET XR) 50-1000 MG TB24; Take 1 tablet by mouth daily.  BPH (benign prostatic hyperplasia)- his PSA is normal now -     PSA; Future  Mass of lung- he tells me that he agrees to see pulmonary soon -     Ambulatory referral to Pulmonology  Pulmonary emphysema, unspecified emphysema type ([place])  DOE (dyspnea on exertion)- his EKG is negative for MI or ischemia but he does have frequent PVCs, his cardiac enzymes today are negative and his BMP is normal, I've asked him to undergo an exercise tolerance stress to screen for ischemia. -     EKG 12-Lead -     Troponin I; Future -     Cardiac panel; Future -     Brain natriuretic peptide; Future -     Exercise Tolerance Test; Future  Lip lesion -     Ambulatory referral to ENT   I have discontinued Mr. Carneiro's ciprofloxacin. I am also having him start on nebivolol. Additionally, I am having him maintain his terazosin, albuterol, Multiple Vitamins-Minerals (CENTRUM SILVER PO), fluticasone furoate-vilanterol, Omega-3 Krill Oil, pravastatin, amLODipine, losartan, and SitaGLIPtin-MetFORMIN HCl.  Meds ordered this encounter  Medications  . amLODipine (NORVASC) 5 MG tablet    Sig: Take 1 tablet (5 mg total) by mouth daily.    Dispense:  90 tablet    Refill:  3  . losartan (COZAAR) 100 MG tablet    Sig: Take 1 tablet (100 mg total) by mouth daily.    Dispense:  90 tablet    Refill:  3  . SitaGLIPtin-MetFORMIN HCl (JANUMET XR) 50-1000 MG TB24    Sig: Take 1 tablet by mouth daily.    Dispense:  90 tablet    Refill:  3  . nebivolol (BYSTOLIC) 5 MG tablet    Sig: Take 1 tablet (5 mg total) by mouth daily.    Dispense:  30 tablet    Refill:  5   See AVS for instructions about healthy living and anticipatory  guidance.  Follow-up: Return in about 6 weeks (around 02/13/2016).  Scarlette Calico, MD

## 2016-01-02 NOTE — Progress Notes (Signed)
Pre visit review using our clinic review tool, if applicable. No additional management support is needed unless otherwise documented below in the visit note. 

## 2016-01-02 NOTE — Patient Instructions (Signed)

## 2016-01-03 ENCOUNTER — Telehealth: Payer: Self-pay | Admitting: Internal Medicine

## 2016-01-03 NOTE — Telephone Encounter (Signed)
Patient called back.  I gave him MD response on labs

## 2016-01-11 ENCOUNTER — Ambulatory Visit (INDEPENDENT_AMBULATORY_CARE_PROVIDER_SITE_OTHER): Payer: Self-pay | Admitting: Physician Assistant

## 2016-01-11 VITALS — BP 148/80 | HR 76 | Temp 97.9°F | Resp 18 | Ht 69.0 in | Wt 252.0 lb

## 2016-01-11 DIAGNOSIS — Z021 Encounter for pre-employment examination: Secondary | ICD-10-CM

## 2016-01-11 DIAGNOSIS — Z024 Encounter for examination for driving license: Secondary | ICD-10-CM

## 2016-01-11 NOTE — Progress Notes (Signed)
Commercial Driver Medical Examination   Jesse Garrison is a 70 y.o. male who presents today for a commercial driver fitness determination physical exam.  The patient reports no problems. Review of his medications and medical record reveal that he has HTN, Diabetes, BPH, hyperlipidemia and history of snoring. When asked, he stated that he wasn't sure which box to mark on the form so he marked "not sure" rather than "yes." He also marked "not sure" on the section inquiring about medications, and did not write them on the form. He denies any apnea, but reports that he wakes every 30-60 minutes during the night to urinate. He saw his PCP on 5/22 and that note is reviewed. BP was not adequately controlled, so nebivolol was added to his regimen.  The following portions of the patient's history were reviewed and updated as appropriate: allergies, current medications, past family history, past medical history, past social history, past surgical history and problem list.  Review of Systems Pertinent items noted in HPI and remainder of comprehensive ROS otherwise negative.   Objective:    Vision/hearing:  Visual Acuity Screening   Right eye Left eye Both eyes  Without correction:     With correction: '20/40 20/40 20/40 '$  Comments: Titmus test was 85 degrees in both eyes.  Hearing Screening Comments: Whisper test was 85f in both ears.  Applicant can recognize and distinguish among traffic control signals and devices showing standard red, green, and amber colors.  Corrective lenses required: Yes  Monocular Vision?: No  Hearing aid requirement: No  Physical Exam  Constitutional: He is oriented to person, place, and time. He appears well-developed and well-nourished. He is active and cooperative.  Non-toxic appearance. He does not have a sickly appearance. He does not appear ill. No distress.  HENT:  Head: Normocephalic and atraumatic.  Right Ear: Hearing, tympanic membrane, external ear and ear  canal normal.  Left Ear: Hearing, tympanic membrane, external ear and ear canal normal.  Nose: Nose normal.  Mouth/Throat: Uvula is midline, oropharynx is clear and moist and mucous membranes are normal. He has dentures (completely edentulous, fully compensated). No oral lesions. No trismus in the jaw. Normal dentition. No dental abscesses, uvula swelling, lacerations or dental caries.  Eyes: Conjunctivae, EOM and lids are normal. Pupils are equal, round, and reactive to light. Right eye exhibits no discharge. Left eye exhibits no discharge. No scleral icterus.  Fundoscopic exam:      The right eye shows no arteriolar narrowing, no AV nicking, no exudate, no hemorrhage and no papilledema.       The left eye shows no arteriolar narrowing, no AV nicking, no exudate, no hemorrhage and no papilledema.  Neck: Normal range of motion, full passive range of motion without pain and phonation normal. Neck supple. No spinous process tenderness and no muscular tenderness present. No rigidity. No tracheal deviation, no edema, no erythema and normal range of motion present. No thyromegaly present.  Cardiovascular: Normal rate, regular rhythm, S1 normal, S2 normal, normal heart sounds, intact distal pulses and normal pulses.  Exam reveals no gallop and no friction rub.   No murmur heard. Pulmonary/Chest: Effort normal and breath sounds normal. No respiratory distress. He has no wheezes. He has no rales.  Abdominal: Soft. Normal appearance and bowel sounds are normal. He exhibits no distension and no mass. There is no hepatosplenomegaly. There is no tenderness. There is no rebound and no guarding. No hernia. Hernia confirmed negative in the ventral area, confirmed negative in the right  inguinal area and confirmed negative in the left inguinal area.  Genitourinary: Testes normal and penis normal. Circumcised. No phimosis, paraphimosis, hypospadias, penile erythema or penile tenderness. No discharge found.   Musculoskeletal: Normal range of motion. He exhibits no edema or tenderness.       Right shoulder: Normal.       Left shoulder: Normal.       Right elbow: Normal.      Left elbow: Normal.       Right wrist: Normal.       Left wrist: Normal.       Right hip: Normal.       Left hip: Normal.       Right knee: Normal.       Left knee: Normal.       Right ankle: Normal. Achilles tendon normal.       Left ankle: Normal. Achilles tendon normal.       Cervical back: Normal. He exhibits normal range of motion, no tenderness, no bony tenderness, no swelling, no edema, no deformity, no laceration, no pain, no spasm and normal pulse.       Thoracic back: Normal.       Lumbar back: Normal.  Lymphadenopathy:       Head (right side): No submental, no submandibular, no tonsillar, no preauricular, no posterior auricular and no occipital adenopathy present.       Head (left side): No submental, no submandibular, no tonsillar, no preauricular, no posterior auricular and no occipital adenopathy present.    He has no cervical adenopathy.       Right: No inguinal and no supraclavicular adenopathy present.       Left: No inguinal and no supraclavicular adenopathy present.  Neurological: He is alert and oriented to person, place, and time. He has normal strength and normal reflexes. He displays no tremor. No cranial nerve deficit or sensory deficit. He exhibits normal muscle tone. Coordination and gait normal.  Reflex Scores:      Bicep reflexes are 2+ on the right side and 2+ on the left side.      Patellar reflexes are 2+ on the right side and 2+ on the left side.      Achilles reflexes are 2+ on the right side and 2+ on the left side. Skin: Skin is warm, dry and intact. No abrasion, no ecchymosis, no laceration, no lesion and no rash noted. He is not diaphoretic. No cyanosis or erythema. No pallor. Nails show no clubbing.  Psychiatric: He has a normal mood and affect. His speech is normal and behavior is  normal. Judgment and thought content normal. Cognition and memory are normal.    BP 142/80 mmHg  Pulse 76  Temp(Src) 97.9 F (36.6 C) (Oral)  Resp 18  Ht '5\' 9"'$  (1.753 m)  Wt 252 lb (114.306 kg)  BMI 37.20 kg/m2  SpO2 90%  Labs: UA:  SG 1.015 PRO TRACE Bld NEG Sug NEG    Assessment:    Healthy male exam.  Meets standards, but periodic monitoring required due to Diabetes, HTN, COPD.  Driver qualified only for 3 months.    Plan:    3 month card issued due to uncontrolled HTN. Patient reports that he forgot to take his BP meds this morning. Advised that he return to see his PCP as planned in early July for re-evaluation of BP. At that time, he is encouraged to discuss the multiple night-time awakenings to urinate, which also likely contributes to the  elevated BP.

## 2016-01-11 NOTE — Patient Instructions (Signed)
     IF you received an x-ray today, you will receive an invoice from [facility]. Please contact [facility] at [phone] with questions or concerns regarding your invoice.   IF you received labwork today, you will receive an invoice from [facility]. Please contact [facility] at [phone] with questions or concerns regarding your invoice.   Our billing staff will not be able to assist you with questions regarding bills from these companies.  You will be contacted with the lab results as soon as they are available. The fastest way to get your results is to activate your My Chart account. Instructions are located on the last page of this paperwork. If you have not heard from us regarding the results in 2 weeks, please contact this office.      

## 2016-02-27 ENCOUNTER — Ambulatory Visit: Payer: Medicare HMO | Admitting: Pulmonary Disease

## 2016-02-27 ENCOUNTER — Encounter: Payer: Self-pay | Admitting: Pulmonary Disease

## 2016-02-27 ENCOUNTER — Ambulatory Visit (INDEPENDENT_AMBULATORY_CARE_PROVIDER_SITE_OTHER): Payer: Commercial Managed Care - HMO | Admitting: Pulmonary Disease

## 2016-02-27 ENCOUNTER — Ambulatory Visit (INDEPENDENT_AMBULATORY_CARE_PROVIDER_SITE_OTHER)
Admission: RE | Admit: 2016-02-27 | Discharge: 2016-02-27 | Disposition: A | Discharge status: Home / Self Care | Payer: Commercial Managed Care - HMO | Source: Ambulatory Visit | Attending: Pulmonary Disease | Admitting: Pulmonary Disease

## 2016-02-27 VITALS — BP 148/84 | HR 85 | Ht 69.0 in | Wt 256.8 lb

## 2016-02-27 DIAGNOSIS — J449 Chronic obstructive pulmonary disease, unspecified: Secondary | ICD-10-CM

## 2016-02-27 DIAGNOSIS — R918 Other nonspecific abnormal finding of lung field: Secondary | ICD-10-CM

## 2016-02-27 NOTE — Progress Notes (Signed)
Current Outpatient Prescriptions on File Prior to Visit  Medication Sig  . albuterol (PROVENTIL HFA;VENTOLIN HFA) 108 (90 BASE) MCG/ACT inhaler Inhale 2 puffs into the lungs every 6 (six) hours as needed.  Marland Kitchen amLODipine (NORVASC) 5 MG tablet Take 1 tablet (5 mg total) by mouth daily.  . Fluticasone Furoate-Vilanterol (BREO ELLIPTA) 100-25 MCG/INH AEPB Inhale 1 Inhaler into the lungs every morning.  Marland Kitchen losartan (COZAAR) 100 MG tablet Take 1 tablet (100 mg total) by mouth daily.  . Multiple Vitamins-Minerals (CENTRUM SILVER PO) Take 1 tablet by mouth daily.  . nebivolol (BYSTOLIC) 5 MG tablet Take 1 tablet (5 mg total) by mouth daily.  . Omega-3 Krill Oil 1000 MG CAPS Take 1 tablet by mouth daily.  . pravastatin (PRAVACHOL) 40 MG tablet Take 1 tablet (40 mg total) by mouth daily.  . SitaGLIPtin-MetFORMIN HCl (JANUMET XR) 50-1000 MG TB24 Take 1 tablet by mouth daily.  Marland Kitchen terazosin (HYTRIN) 5 MG capsule Take 5 mg by mouth at bedtime.     No current facility-administered medications on file prior to visit.    Chief Complaint  Patient presents with  . Follow-up    Pt denies any increased breathing issues  - Beathing is at baseline - moderate DOE; no change since last OV. Pt states that the Breo seems to help control his COPD.    Tests: PFT 04/03/13 >> FEV1 1.42 (52%), FEV1% 49, TLC 5.78 (87%), DLCO 44% CT chest 02/18/14 >> centrilobular/ paraseptal emphysema, 4.2 cm RUL mass, 2.3 cm LUL mass PET scan 03/22/14 >> 5.4 cm RUL mass 9.2 SUV  PMHx  HTN, HLD, BPH, DM  PSHx, Medications, Allergies, Fhx, Shx reviewed.   Vitals signs BP 148/84 mmHg  Pulse 85  Ht '5\' 9"'$  (1.753 m)  Wt 256 lb 12.8 oz (116.484 kg)  BMI 37.91 kg/m2  SpO2 90%  History of Present Illness: Jesse Garrison is a 70 y.o. male former smoker with lung mass, and COPD/emphysema.  He has been doing well.  He has lost a few lbs.  He is not having cough, wheeze, sputum, hemoptysis, or chest pain.  Feels breo helps.  Not using  albuterol much.  Denies leg swelling.  Not very active, but does okay with routine activities.  Physical Exam:  General - No distress ENT - No sinus tenderness, no oral exudate, no LAN Cardiac - s1s2 regular, no murmur, pulses symmetric Chest - No wheeze/rales Back - No focal tenderness Abd - Soft, non-tender Ext - No edema Neuro - Normal strength Skin - No rashes Psych - Normal mood, and behavior  Assessment/Plan:  Lung mass. - Concern is still that he could have malignancy - he prefers conservative strategy - will repeat CXR and call him with results  COPD with emphysema. - continue breo - continue prn albuterol  DOT clearance. 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

## 2016-02-27 NOTE — Patient Instructions (Signed)
Chest xray today  Follow up in 1 year

## 2016-02-28 ENCOUNTER — Ambulatory Visit (INDEPENDENT_AMBULATORY_CARE_PROVIDER_SITE_OTHER): Payer: Commercial Managed Care - HMO | Admitting: Internal Medicine

## 2016-02-28 ENCOUNTER — Telehealth: Payer: Self-pay | Admitting: Pulmonary Disease

## 2016-02-28 ENCOUNTER — Encounter: Payer: Self-pay | Admitting: Internal Medicine

## 2016-02-28 ENCOUNTER — Telehealth: Payer: Self-pay

## 2016-02-28 VITALS — BP 140/72 | HR 85 | Temp 98.4°F | Resp 16 | Ht 69.0 in | Wt 256.0 lb

## 2016-02-28 DIAGNOSIS — E669 Obesity, unspecified: Secondary | ICD-10-CM | POA: Diagnosis not present

## 2016-02-28 DIAGNOSIS — I10 Essential (primary) hypertension: Secondary | ICD-10-CM

## 2016-02-28 MED ORDER — NEBIVOLOL HCL 5 MG PO TABS: 5.0000 mg | ORAL_TABLET | Freq: Every day | ORAL | Status: DC

## 2016-02-28 NOTE — Telephone Encounter (Signed)
Pt had an appt today and requested for [place] to fax [employer] form to [phone] for [employer] due to pt dx of DM.   Form faxed with PCP signature and A1c results.

## 2016-02-28 NOTE — Telephone Encounter (Signed)
Dg Chest 2 View  02/27/2016  CLINICAL DATA:  Follow-up of a small mass according to the patient; history of COPD, former smoker, diabetes. EXAM: CHEST  2 VIEW COMPARISON:  Chest x-ray of August 04, 2015 FINDINGS: Again demonstrated is a spiculated mass in the right upper lobe. Measurement along the same plane as that used previously suggests that it is decreased slightly in size to approximately 23 mm. There is surrounding linear atelectasis or scarring. The lungs are mildly hyperinflated. The interstitial markings are chronically increased in the mid and lower lobes. The heart is normal in size. There is calcification in the wall of the aortic arch. The observed bony thorax exhibits no acute abnormality. IMPRESSION: Chronic pulmonary fibrotic changes which appears stable. Stable to slightly decreased size of the spiculated right upper lobe mass. Aortic atherosclerosis. Electronically Signed   By: David  Martinique M.D.   On: 02/27/2016 15:53    Results d/w pt.  Explained that lesion appears smaller.  Will monitor clinically.

## 2016-02-28 NOTE — Progress Notes (Signed)
Pre visit review using our clinic review tool, if applicable. No additional management support is needed unless otherwise documented below in the visit note. 

## 2016-02-28 NOTE — Progress Notes (Signed)
Subjective:  Patient ID: Jesse Garrison, male    DOB: [birth year]  Age: 70 y.o. MRN: [id number]  CC: Hypertension; Hyperlipidemia; and Diabetes   HPI Jesse Garrison presents for a BP check. When I last saw him his blood pressure was not well controlled on amlodipine and losartan so I added nebivolol. He tells me since that time his blood pressure has been well controlled. He has had no episodes of headache, blurred vision, chest pain, edema, or palpitations. He has a baseline level of shortness of breath that has not worsened. He has had no episodes of coughing or hemoptysis.  Outpatient Prescriptions Prior to Visit  Medication Sig Dispense Refill  . albuterol (PROVENTIL HFA;VENTOLIN HFA) 108 (90 BASE) MCG/ACT inhaler Inhale 2 puffs into the lungs every 6 (six) hours as needed.    Marland Kitchen amLODipine (NORVASC) 5 MG tablet Take 1 tablet (5 mg total) by mouth daily. 90 tablet 3  . Fluticasone Furoate-Vilanterol (BREO ELLIPTA) 100-25 MCG/INH AEPB Inhale 1 Inhaler into the lungs every morning. 28 each 11  . losartan (COZAAR) 100 MG tablet Take 1 tablet (100 mg total) by mouth daily. 90 tablet 3  . Multiple Vitamins-Minerals (CENTRUM SILVER PO) Take 1 tablet by mouth daily.    . Omega-3 Krill Oil 1000 MG CAPS Take 1 tablet by mouth daily.    . pravastatin (PRAVACHOL) 40 MG tablet Take 1 tablet (40 mg total) by mouth daily. 30 tablet 11  . SitaGLIPtin-MetFORMIN HCl (JANUMET XR) 50-1000 MG TB24 Take 1 tablet by mouth daily. 90 tablet 3  . terazosin (HYTRIN) 5 MG capsule Take 5 mg by mouth at bedtime.      . nebivolol (BYSTOLIC) 5 MG tablet Take 1 tablet (5 mg total) by mouth daily. 30 tablet 5   No facility-administered medications prior to visit.    ROS Review of Systems  Constitutional: Negative.  Negative for fever, chills, activity change, appetite change and fatigue.  HENT: Negative.   Eyes: Negative.  Negative for visual disturbance.  Respiratory: Positive for shortness of breath. Negative for  cough, choking, chest tightness, wheezing and stridor.   Cardiovascular: Negative.  Negative for chest pain, palpitations and leg swelling.  Gastrointestinal: Negative.  Negative for nausea, vomiting, abdominal pain, diarrhea and constipation.  Endocrine: Negative.   Genitourinary: Negative.  Negative for dysuria, urgency, frequency, hematuria and difficulty urinating.  Musculoskeletal: Negative.  Negative for myalgias, back pain, joint swelling, arthralgias and neck pain.  Skin: Negative.  Negative for color change and rash.  Allergic/Immunologic: Negative.   Neurological: Negative.  Negative for dizziness, tremors, weakness and numbness.  Hematological: Negative.  Negative for adenopathy. Does not bruise/bleed easily.  Psychiatric/Behavioral: Negative.     Objective:  BP 140/72 mmHg  Pulse 85  Temp(Src) 98.4 F (36.9 C) (Oral)  Resp 16  Ht '5\' 9"'$  (1.753 m)  Wt 256 lb (116.121 kg)  BMI 37.79 kg/m2  SpO2 90%  BP Readings from Last 3 Encounters:  02/28/16 140/72  02/27/16 148/84  01/11/16 148/80    Wt Readings from Last 3 Encounters:  02/28/16 256 lb (116.121 kg)  02/27/16 256 lb 12.8 oz (116.484 kg)  01/11/16 252 lb (114.306 kg)    Physical Exam  Constitutional: He is oriented to person, place, and time. No distress.  HENT:  Mouth/Throat: Oropharynx is clear and moist. No oropharyngeal exudate.  Eyes: Conjunctivae are normal. Right eye exhibits no discharge. Left eye exhibits no discharge. No scleral icterus.  Neck: Normal range of motion. Neck supple. No  JVD present. No tracheal deviation present. No thyromegaly present.  Cardiovascular: Normal rate, regular rhythm, normal heart sounds and intact distal pulses.  Exam reveals no gallop and no friction rub.   No murmur heard. Pulmonary/Chest: Effort normal and breath sounds normal. No stridor. No respiratory distress. He has no wheezes. He has no rales. He exhibits no tenderness.  Abdominal: Soft. Bowel sounds are normal.  He exhibits no distension and no mass. There is no tenderness. There is no rebound and no guarding.  Musculoskeletal: Normal range of motion. He exhibits no edema or tenderness.  Lymphadenopathy:    He has no cervical adenopathy.  Neurological: He is oriented to person, place, and time.  Skin: Skin is warm and dry. No rash noted. He is not diaphoretic. No erythema. No pallor.  Vitals reviewed.   Lab Results  Component Value Date   WBC 12.9* 01/02/2016   HGB 13.7 01/02/2016   HCT 41.9 01/02/2016   PLT 190.0 01/02/2016   GLUCOSE 91 01/02/2016   CHOL 198 08/04/2015   TRIG 84.0 08/04/2015   HDL 40.10 08/04/2015   LDLCALC 141* 08/04/2015   ALT 14 01/02/2016   AST 14 01/02/2016   NA 141 01/02/2016   K 4.4 01/02/2016   CL 110 01/02/2016   CREATININE 1.18 01/02/2016   BUN 13 01/02/2016   CO2 24 01/02/2016   TSH 0.72 08/04/2015   PSA 2.92 01/02/2016   HGBA1C 6.9* 01/02/2016   MICROALBUR 1.3 08/04/2015    Dg Chest 2 View  02/27/2016  CLINICAL DATA:  Follow-up of a small mass according to the patient; history of COPD, former smoker, diabetes. EXAM: CHEST  2 VIEW COMPARISON:  Chest x-ray of August 04, 2015 FINDINGS: Again demonstrated is a spiculated mass in the right upper lobe. Measurement along the same plane as that used previously suggests that it is decreased slightly in size to approximately 23 mm. There is surrounding linear atelectasis or scarring. The lungs are mildly hyperinflated. The interstitial markings are chronically increased in the mid and lower lobes. The heart is normal in size. There is calcification in the wall of the aortic arch. The observed bony thorax exhibits no acute abnormality. IMPRESSION: Chronic pulmonary fibrotic changes which appears stable. Stable to slightly decreased size of the spiculated right upper lobe mass. Aortic atherosclerosis. Electronically Signed   By: David  Martinique M.D.   On: 02/27/2016 15:53    Assessment & Plan:   Jesse Garrison was seen today  for hypertension, hyperlipidemia and diabetes.  Diagnoses and all orders for this visit:  Obesity (BMI 35.0-39.9 without comorbidity) ([place])- he is working on his lifestyle modifications in an effort to lose weight  Essential hypertension- his blood pressure is adequately well controlled on the current regimen -     nebivolol (BYSTOLIC) 5 MG tablet; Take 1 tablet (5 mg total) by mouth daily.   I am having Jesse Garrison maintain his terazosin, albuterol, Multiple Vitamins-Minerals (CENTRUM SILVER PO), fluticasone furoate-vilanterol, Omega-3 Krill Oil, pravastatin, amLODipine, losartan, SitaGLIPtin-MetFORMIN HCl, and nebivolol.  Meds ordered this encounter  Medications  . nebivolol (BYSTOLIC) 5 MG tablet    Sig: Take 1 tablet (5 mg total) by mouth daily.    Dispense:  30 tablet    Refill:  11     Follow-up: Return in about 4 months (around 06/30/2016).  Scarlette Calico, MD

## 2016-02-28 NOTE — Patient Instructions (Signed)

## 2016-04-04 ENCOUNTER — Encounter (HOSPITAL_COMMUNITY): Payer: Self-pay

## 2016-04-04 ENCOUNTER — Emergency Department (HOSPITAL_COMMUNITY): Payer: Commercial Managed Care - HMO

## 2016-04-04 ENCOUNTER — Emergency Department (HOSPITAL_COMMUNITY)
Admission: EM | Admit: 2016-04-04 | Discharge: 2016-04-04 | Disposition: A | Discharge status: Home / Self Care | Payer: Commercial Managed Care - HMO | Attending: Emergency Medicine | Admitting: Emergency Medicine

## 2016-04-04 DIAGNOSIS — M25531 Pain in right wrist: Secondary | ICD-10-CM | POA: Diagnosis not present

## 2016-04-04 DIAGNOSIS — Z87891 Personal history of nicotine dependence: Secondary | ICD-10-CM | POA: Diagnosis not present

## 2016-04-04 DIAGNOSIS — M109 Gout, unspecified: Secondary | ICD-10-CM | POA: Insufficient documentation

## 2016-04-04 DIAGNOSIS — E119 Type 2 diabetes mellitus without complications: Secondary | ICD-10-CM | POA: Diagnosis not present

## 2016-04-04 DIAGNOSIS — Z7984 Long term (current) use of oral hypoglycemic drugs: Secondary | ICD-10-CM | POA: Insufficient documentation

## 2016-04-04 DIAGNOSIS — I10 Essential (primary) hypertension: Secondary | ICD-10-CM | POA: Diagnosis not present

## 2016-04-04 DIAGNOSIS — J449 Chronic obstructive pulmonary disease, unspecified: Secondary | ICD-10-CM | POA: Diagnosis not present

## 2016-04-04 MED ORDER — NAPROXEN 250 MG PO TABS
500.0000 mg | ORAL_TABLET | Freq: Once | ORAL | Status: AC
  Administered 2016-04-04: 500 mg via ORAL
  Filled 2016-04-04: qty 2

## 2016-04-04 MED ORDER — NAPROXEN 500 MG PO TABS
500.0000 mg | ORAL_TABLET | Freq: Two times a day (BID) | ORAL | 0 refills | Status: DC
Start: 2016-04-04 — End: 2016-10-17

## 2016-04-04 NOTE — ED Provider Notes (Signed)
[facility] DEPT Provider Note   CSN: [id number] Arrival date & time: 04/04/16  1742  By signing my name below, I, Jasmyn B. Alexander, attest that this documentation has been prepared under the direction and in the presence of Gloriann Loan, PA-C. Electronically Signed: Tedra Coupe. Sheppard Coil, ED Scribe. 04/04/16. 6:41 PM.  History   Chief Complaint Chief Complaint  Patient presents with  . Wrist Pain    HPI HPI Comments: Jesse Garrison is a 70 y.o. male with PMHx of COPD, DM, HTN, and HLD who presents to the Emergency Department complaining of gradual onset, constant, throbbing, right wrist pain x 2 days. Pt reports that pain feels like a "gout pain," similar to pain he has felt in his foot before. Pt has associated swelling of the right wrist. Denies any recent fall or injury. He states that pain is exacerbated with movement of the wrist. He has taken Ibuprofen with no relief of pain. He is compliant with daily medications. Denies any fever, chills, weakness, or numbness.  The history is provided by the patient. No language interpreter was used.   Past Medical History:  Diagnosis Date  . BPH (benign prostatic hyperplasia)   . COPD (chronic obstructive pulmonary disease) ([place])   . Diabetes ([place])   . Hyperlipidemia   . Hypertension   . Osteoarthritis   . Personal history of colonic adenomas 05/08/2013    Patient Active Problem List   Diagnosis Date Noted  . Obesity (BMI 35.0-39.9 without comorbidity) ([place]) 02/28/2016  . Knee pain, chronic 08/04/2015  . Mass of lung 11/16/2013  . Personal history of colonic adenomas 05/08/2013  . Hyperlipidemia with target LDL less than 100 02/16/2013  . Routine general medical examination at a health care facility 06/11/2012  . Snoring 12/18/2011  . Cataract 12/18/2011  . Diabetes mellitus type 2 with complications ([place]) 51/70/0174  . Essential hypertension 07/27/2010  . COPD with emphysema ([place]) 07/27/2010  . BPH (benign prostatic  hyperplasia) 07/27/2010  . OSTEOARTHRITIS 07/27/2010    Past Surgical History:  Procedure Laterality Date  . fatty tissue removal  1996   forehead    Home Medications    Prior to Admission medications   Medication Sig Start Date End Date Taking? Authorizing Provider  albuterol (PROVENTIL HFA;VENTOLIN HFA) 108 (90 BASE) MCG/ACT inhaler Inhale 2 puffs into the lungs every 6 (six) hours as needed. 04/12/11   Janith Lima, MD  amLODipine (NORVASC) 5 MG tablet Take 1 tablet (5 mg total) by mouth daily. 01/02/16   Janith Lima, MD  Fluticasone Furoate-Vilanterol (BREO ELLIPTA) 100-25 MCG/INH AEPB Inhale 1 Inhaler into the lungs every morning. 04/03/13   Tanda Rockers, MD  losartan (COZAAR) 100 MG tablet Take 1 tablet (100 mg total) by mouth daily. 01/02/16   Janith Lima, MD  Multiple Vitamins-Minerals (CENTRUM SILVER PO) Take 1 tablet by mouth daily.    Historical Provider, MD  naproxen (NAPROSYN) 500 MG tablet Take 1 tablet (500 mg total) by mouth 2 (two) times daily. 04/04/16   Gloriann Loan, PA-C  nebivolol (BYSTOLIC) 5 MG tablet Take 1 tablet (5 mg total) by mouth daily. 02/28/16   Janith Lima, MD  Omega-3 Krill Oil 1000 MG CAPS Take 1 tablet by mouth daily.    Historical Provider, MD  pravastatin (PRAVACHOL) 40 MG tablet Take 1 tablet (40 mg total) by mouth daily. 08/05/15   Janith Lima, MD  SitaGLIPtin-MetFORMIN HCl (JANUMET XR) 50-1000 MG TB24 Take 1 tablet by mouth daily. 01/02/16  Janith Lima, MD  terazosin (HYTRIN) 5 MG capsule Take 5 mg by mouth at bedtime.      Historical Provider, MD    Family History Family History  Problem Relation Age of Onset  . Diabetes Other   . Emphysema Mother     smoked  . Colon cancer Neg Hx   . Esophageal cancer Neg Hx   . Rectal cancer Neg Hx   . Stomach cancer Neg Hx     Social History Social History  Substance Use Topics  . Smoking status: Former Smoker    Packs/day: 1.00    Years: 50.00    Types: Cigarettes    Quit date:  06/19/2011  . Smokeless tobacco: Never Used  . Alcohol use 0.6 oz/week    1 Cans of beer per week     Allergies   Review of patient's allergies indicates no known allergies.   Review of Systems Review of Systems  Constitutional: Negative for chills and fever.  Musculoskeletal: Positive for joint swelling and myalgias.  Neurological: Negative for weakness and numbness.  All other systems reviewed and are negative.  Physical Exam Updated Vital Signs BP 159/75 (BP Location: Left Arm)   Pulse 76   Temp 97.7 F (36.5 C) (Oral)   Resp 18   Ht '5\' 10"'$  (1.778 m)   Wt 120.2 kg   SpO2 94%   BMI 38.02 kg/m   Physical Exam  Constitutional: He is oriented to person, place, and time. He appears well-developed and well-nourished.  HENT:  Head: Normocephalic and atraumatic.  Right Ear: External ear normal.  Left Ear: External ear normal.  Eyes: Conjunctivae are normal. No scleral icterus.  Neck: No tracheal deviation present.  Cardiovascular:  Pulses:      Radial pulses are 2+ on the right side, and 2+ on the left side.  Brisk capillary refill.   Pulmonary/Chest: Effort normal. No respiratory distress.  Abdominal: He exhibits no distension.  Musculoskeletal: Normal range of motion. He exhibits tenderness.       Right wrist: He exhibits tenderness and swelling. He exhibits normal range of motion, no bony tenderness, no crepitus and no deformity.  Right wrist with mild swelling and warmth.  No erythema, induration, or fluctuance.   Neurological: He is alert and oriented to person, place, and time.  5/5 grip strength and wrist strength bilaterally. Normal sensation to light touch.  Skin: Skin is warm and dry.  Psychiatric: He has a normal mood and affect. His behavior is normal.   ED Treatments / Results  DIAGNOSTIC STUDIES: Oxygen Saturation is 94% on RA, adequate by my interpretation.    Radiology Dg Wrist Complete Right  Result Date: 04/04/2016 CLINICAL DATA:  Right wrist  pain for 2 days. "Gout like" per patient. EXAM: RIGHT WRIST - COMPLETE 3+ VIEW COMPARISON:  None. FINDINGS: No acute fracture or malalignment. Nonspecific carpal cystic changes most notable in the scaphoid. No calcified soft tissue mass. No notable degenerative changes for age IMPRESSION: No acute finding or specific explanation for pain. Electronically Signed   By: Monte Fantasia M.D.   On: 04/04/2016 19:00    Procedures Procedures (including critical care time)  Medications Ordered in ED Medications  naproxen (NAPROSYN) tablet 500 mg (not administered)     Initial Impression / Assessment and Plan / ED Course  I have reviewed the triage vital signs and the nursing notes.  Pertinent labs & imaging results that were available during my care of the patient were reviewed  by me and considered in my medical decision making (see chart for details).  Clinical Course   Pt presents with monoarticular pain, swelling and erythema.  Pt is afebrile and stable. Imaging reviewed, no evidence of occult fracture or injury. Renal function in May good.  Pt without known peptic ulcer disease and not receiving concurrent treatment on warfarin. Pt dc with naproxen 500 mg BID. Discussed that pt should respond to treatment with in 24 hour of begining treatment & likely resolve in 2-3 days.     Final Clinical Impressions(s) / ED Diagnoses   Final diagnoses:  Acute gout of right wrist, unspecified cause    New Prescriptions New Prescriptions   NAPROXEN (NAPROSYN) 500 MG TABLET    Take 1 tablet (500 mg total) by mouth 2 (two) times daily.   I personally performed the services described in this documentation, which was scribed in my presence. The recorded information has been reviewed and is accurate.     Gloriann Loan, PA-C 04/04/16 [place], MD 04/07/16 2136

## 2016-04-04 NOTE — ED Notes (Signed)
Patient transported to X-ray 

## 2016-04-04 NOTE — ED Triage Notes (Signed)
Per Pt, pt is coming from home with complaints of right wrist pain and edema since Monday with no relief from medication. Denies injury.

## 2016-04-04 NOTE — Discharge Instructions (Signed)
Your symptoms are likely due to gout.  Start taking Naproxen twice daily until your pain resolves, this should happen in about 2-3 days.  Follow up with your primary care physician.

## 2016-04-05 ENCOUNTER — Ambulatory Visit (INDEPENDENT_AMBULATORY_CARE_PROVIDER_SITE_OTHER): Payer: Commercial Managed Care - HMO

## 2016-04-05 DIAGNOSIS — R0609 Other forms of dyspnea: Secondary | ICD-10-CM

## 2016-04-05 LAB — EXERCISE TOLERANCE TEST
CHL RATE OF PERCEIVED EXERTION: 15
CSEPPHR: 89 {beats}/min
Estimated workload: 2.7 METS
Exercise duration (sec): 51 s
MPHR: 150 {beats}/min
Percent HR: 59 %
Rest HR: 69 {beats}/min

## 2016-05-23 ENCOUNTER — Encounter: Payer: Self-pay | Admitting: Internal Medicine

## 2016-05-30 ENCOUNTER — Encounter: Payer: Self-pay | Admitting: Internal Medicine

## 2016-05-30 ENCOUNTER — Other Ambulatory Visit (INDEPENDENT_AMBULATORY_CARE_PROVIDER_SITE_OTHER): Payer: Commercial Managed Care - HMO

## 2016-05-30 ENCOUNTER — Ambulatory Visit (INDEPENDENT_AMBULATORY_CARE_PROVIDER_SITE_OTHER): Payer: Commercial Managed Care - HMO | Admitting: Internal Medicine

## 2016-05-30 VITALS — BP 150/80 | HR 69 | Temp 99.2°F | Resp 16 | Ht 70.0 in | Wt 257.5 lb

## 2016-05-30 DIAGNOSIS — E669 Obesity, unspecified: Secondary | ICD-10-CM | POA: Diagnosis not present

## 2016-05-30 DIAGNOSIS — Z23 Encounter for immunization: Secondary | ICD-10-CM

## 2016-05-30 DIAGNOSIS — K130 Diseases of lips: Secondary | ICD-10-CM | POA: Diagnosis not present

## 2016-05-30 DIAGNOSIS — M1000 Idiopathic gout, unspecified site: Secondary | ICD-10-CM

## 2016-05-30 DIAGNOSIS — E785 Hyperlipidemia, unspecified: Secondary | ICD-10-CM

## 2016-05-30 DIAGNOSIS — I10 Essential (primary) hypertension: Secondary | ICD-10-CM | POA: Diagnosis not present

## 2016-05-30 DIAGNOSIS — J439 Emphysema, unspecified: Secondary | ICD-10-CM

## 2016-05-30 DIAGNOSIS — E118 Type 2 diabetes mellitus with unspecified complications: Secondary | ICD-10-CM

## 2016-05-30 LAB — CBC WITH DIFFERENTIAL/PLATELET
BASOS ABS: 0 10*3/uL (ref 0.0–0.1)
Basophils Relative: 0.2 % (ref 0.0–3.0)
EOS ABS: 0.4 10*3/uL (ref 0.0–0.7)
Eosinophils Relative: 2.7 % (ref 0.0–5.0)
HCT: 43.4 % (ref 39.0–52.0)
Hemoglobin: 14.2 g/dL (ref 13.0–17.0)
LYMPHS ABS: 2.1 10*3/uL (ref 0.7–4.0)
Lymphocytes Relative: 14.3 % (ref 12.0–46.0)
MCHC: 32.7 g/dL (ref 30.0–36.0)
MCV: 80.4 fl (ref 78.0–100.0)
Monocytes Absolute: 1 10*3/uL (ref 0.1–1.0)
Monocytes Relative: 6.6 % (ref 3.0–12.0)
NEUTROS ABS: 11.3 10*3/uL — AB (ref 1.4–7.7)
Neutrophils Relative %: 76.2 % (ref 43.0–77.0)
PLATELETS: 194 10*3/uL (ref 150.0–400.0)
RBC: 5.39 Mil/uL (ref 4.22–5.81)
RDW: 15.8 % — ABNORMAL HIGH (ref 11.5–15.5)
WBC: 14.9 10*3/uL — ABNORMAL HIGH (ref 4.0–10.5)

## 2016-05-30 LAB — BASIC METABOLIC PANEL
BUN: 15 mg/dL (ref 6–23)
CHLORIDE: 104 meq/L (ref 96–112)
CO2: 26 mEq/L (ref 19–32)
Calcium: 10.1 mg/dL (ref 8.4–10.5)
Creatinine, Ser: 1.2 mg/dL (ref 0.40–1.50)
GFR: 76.75 mL/min (ref >60.00)
Glucose, Bld: 91 mg/dL (ref 70–99)
POTASSIUM: 4.4 meq/L (ref 3.5–5.1)
SODIUM: 138 meq/L (ref 135–145)

## 2016-05-30 LAB — LIPID PANEL
CHOL/HDL RATIO: 3
CHOLESTEROL: 135 mg/dL (ref 0–200)
HDL: 39.8 mg/dL (ref >39.00)
LDL Cholesterol: 75 mg/dL (ref 0–99)
NonHDL: 95.26
TRIGLYCERIDES: 99 mg/dL (ref 0.0–149.0)
VLDL: 19.8 mg/dL (ref 0.0–40.0)

## 2016-05-30 LAB — HEMOGLOBIN A1C: Hgb A1c MFr Bld: 6.6 % — ABNORMAL HIGH (ref 4.6–6.5)

## 2016-05-30 LAB — URIC ACID: URIC ACID, SERUM: 6.9 mg/dL (ref 4.0–7.8)

## 2016-05-30 MED ORDER — PRAVASTATIN SODIUM 40 MG PO TABS: 40.0000 mg | ORAL_TABLET | Freq: Every day | ORAL | 11 refills | Status: DC

## 2016-05-30 MED ORDER — FLUTICASONE FUROATE-VILANTEROL 100-25 MCG/INH IN AEPB: 1.0000 | INHALATION_SPRAY | Freq: Every morning | RESPIRATORY_TRACT | 11 refills | Status: DC

## 2016-05-30 NOTE — Progress Notes (Signed)
Subjective:  Patient ID: Jesse Garrison, male    DOB: 05/26/1946  Age: 70 y.o. MRN: [id number]  CC: Hypertension; Diabetes; and COPD   HPI Jesse Garrison presents for follow-up on the above medical problems. He has had a lesion on his left lower lip for over a year. I previously referred him to ENT but he did not follow up with that appointment. He is concerned that the lesion has persisted. He iha otherwise felt well lately with very minimal shortness of breath and no recent cough, hemoptysis, wheezing, night sweats, fever, or chills. He tells me his blood pressure has been well controlled and he has had no episodes of headache, blurred vision, chest pain, palpitations, or edema.  Outpatient Medications Prior to Visit  Medication Sig Dispense Refill  . albuterol (PROVENTIL HFA;VENTOLIN HFA) 108 (90 BASE) MCG/ACT inhaler Inhale 2 puffs into the lungs every 6 (six) hours as needed.    Marland Kitchen amLODipine (NORVASC) 5 MG tablet Take 1 tablet (5 mg total) by mouth daily. 90 tablet 3  . losartan (COZAAR) 100 MG tablet Take 1 tablet (100 mg total) by mouth daily. 90 tablet 3  . naproxen (NAPROSYN) 500 MG tablet Take 1 tablet (500 mg total) by mouth 2 (two) times daily. 14 tablet 0  . nebivolol (BYSTOLIC) 5 MG tablet Take 1 tablet (5 mg total) by mouth daily. 30 tablet 11  . Omega-3 Krill Oil 1000 MG CAPS Take 1 tablet by mouth daily.    . SitaGLIPtin-MetFORMIN HCl (JANUMET XR) 50-1000 MG TB24 Take 1 tablet by mouth daily. 90 tablet 3  . terazosin (HYTRIN) 5 MG capsule Take 5 mg by mouth at bedtime.      . Fluticasone Furoate-Vilanterol (BREO ELLIPTA) 100-25 MCG/INH AEPB Inhale 1 Inhaler into the lungs every morning. 28 each 11  . Multiple Vitamins-Minerals (CENTRUM SILVER PO) Take 1 tablet by mouth daily.    . pravastatin (PRAVACHOL) 40 MG tablet Take 1 tablet (40 mg total) by mouth daily. 30 tablet 11   No facility-administered medications prior to visit.     ROS Review of Systems  Constitutional:  Negative.  Negative for appetite change, fatigue and fever.  HENT: Negative.  Negative for facial swelling, mouth sores and sore throat.   Eyes: Negative.   Respiratory: Positive for shortness of breath. Negative for apnea, cough, choking, chest tightness, wheezing and stridor.   Cardiovascular: Negative.  Negative for chest pain, palpitations and leg swelling.  Gastrointestinal: Negative for abdominal pain, constipation, diarrhea, nausea and vomiting.  Endocrine: Negative.   Genitourinary: Negative.  Negative for dysuria.  Musculoskeletal: Negative.  Negative for arthralgias, back pain and neck pain.  Skin: Negative.  Negative for color change and rash.  Allergic/Immunologic: Negative.   Neurological: Negative.  Negative for dizziness.  Hematological: Negative for adenopathy. Does not bruise/bleed easily.  Psychiatric/Behavioral: Negative.     Objective:  BP (!) 150/80 (BP Location: Left Arm, Patient Position: Sitting, Cuff Size: Large)   Pulse 69   Temp 99.2 F (37.3 C) (Oral)   Resp 16   Ht '5\' 10"'$  (1.778 m)   Wt 257 lb 8 oz (116.8 kg)   SpO2 91%   BMI 36.95 kg/m   BP Readings from Last 3 Encounters:  05/30/16 (!) 150/80  04/04/16 157/81  02/28/16 140/72    Wt Readings from Last 3 Encounters:  05/30/16 257 lb 8 oz (116.8 kg)  04/04/16 265 lb (120.2 kg)  02/28/16 256 lb (116.1 kg)    Physical Exam  Constitutional: He is oriented to person, place, and time.  HENT:  Mouth/Throat: Oropharynx is clear and moist. No oropharyngeal exudate.    Eyes: Conjunctivae are normal. Right eye exhibits no discharge. Left eye exhibits no discharge. No scleral icterus.  Neck: Normal range of motion. Neck supple. No JVD present. No tracheal deviation present. No thyromegaly present.  Cardiovascular: Normal rate, regular rhythm, normal heart sounds and intact distal pulses.  Exam reveals no gallop and no friction rub.   No murmur heard. Pulmonary/Chest: Effort normal and breath sounds  normal. No stridor. No respiratory distress. He has no wheezes. He has no rales. He exhibits no tenderness.  Abdominal: Soft. Bowel sounds are normal. He exhibits no distension and no mass. There is no tenderness. There is no rebound and no guarding.  Musculoskeletal: Normal range of motion. He exhibits no edema, tenderness or deformity.  Lymphadenopathy:    He has no cervical adenopathy.  Neurological: He is oriented to person, place, and time.  Skin: Skin is warm and dry. No rash noted. He is not diaphoretic. No erythema. No pallor.  Psychiatric: He has a normal mood and affect. His behavior is normal. Judgment and thought content normal.  Vitals reviewed.   Lab Results  Component Value Date   WBC 14.9 (H) 05/30/2016   HGB 14.2 05/30/2016   HCT 43.4 05/30/2016   PLT 194.0 05/30/2016   GLUCOSE 91 05/30/2016   CHOL 135 05/30/2016   TRIG 99.0 05/30/2016   HDL 39.80 05/30/2016   LDLCALC 75 05/30/2016   ALT 14 01/02/2016   AST 14 01/02/2016   NA 138 05/30/2016   K 4.4 05/30/2016   CL 104 05/30/2016   CREATININE 1.20 05/30/2016   BUN 15 05/30/2016   CO2 26 05/30/2016   TSH 0.72 08/04/2015   PSA 2.92 01/02/2016   HGBA1C 6.6 (H) 05/30/2016   MICROALBUR 1.3 08/04/2015    Dg Wrist Complete Right  Result Date: 04/04/2016 CLINICAL DATA:  Right wrist pain for 2 days. "Gout like" per patient. EXAM: RIGHT WRIST - COMPLETE 3+ VIEW COMPARISON:  None. FINDINGS: No acute fracture or malalignment. Nonspecific carpal cystic changes most notable in the scaphoid. No calcified soft tissue mass. No notable degenerative changes for age IMPRESSION: No acute finding or specific explanation for pain. Electronically Signed   By: Monte Fantasia M.D.   On: 04/04/2016 19:00    Assessment & Plan:   Jesse Garrison was seen today for hypertension, diabetes and copd.  Diagnoses and all orders for this visit:  Essential hypertension- his blood pressure is adequately well-controlled, lites and renal function are  stable. -     CBC with Differential/Platelet; Future -     Basic metabolic panel; Future  Obesity (BMI 35.0-39.9 without comorbidity)- he agrees to work on his lifestyle modifications to help him lose weight.  Type 2 diabetes mellitus with complication, without long-term current use of insulin ([place])- His A1c is at 6.6%, his blood sugars are adequately well controlled. -     pravastatin (PRAVACHOL) 40 MG tablet; Take 1 tablet (40 mg total) by mouth daily. -     Hemoglobin A1c; Future -     Basic metabolic panel; Future  Lesion of lip -     Ambulatory referral to ENT  Pulmonary emphysema, unspecified emphysema type ([place])- his symptoms are well controlled on the LABA/ICS combination -     fluticasone furoate-vilanterol (BREO ELLIPTA) 100-25 MCG/INH AEPB; Inhale 1 puff into the lungs every morning.  Hyperlipidemia with target LDL  less than 100- he iha achieved his LDL goal is doing well on the statin. -     pravastatin (PRAVACHOL) 40 MG tablet; Take 1 tablet (40 mg total) by mouth daily. -     Lipid panel; Future  Idiopathic gout, unspecified chronicity, unspecified site- he is achieved his uric acid goal -     Uric acid; Future  Need for prophylactic vaccination and inoculation against influenza -     Flu vaccine HIGH DOSE PF (Fluzone High dose)   I have discontinued Mr. Teague's Multiple Vitamins-Minerals (CENTRUM SILVER PO). I have also changed his fluticasone furoate-vilanterol. Additionally, I am having him maintain his terazosin, albuterol, Omega-3 Krill Oil, amLODipine, losartan, SitaGLIPtin-MetFORMIN HCl, nebivolol, naproxen, and pravastatin.  Meds ordered this encounter  Medications  . fluticasone furoate-vilanterol (BREO ELLIPTA) 100-25 MCG/INH AEPB    Sig: Inhale 1 puff into the lungs every morning.    Dispense:  28 each    Refill:  11  . pravastatin (PRAVACHOL) 40 MG tablet    Sig: Take 1 tablet (40 mg total) by mouth daily.    Dispense:  30 tablet    Refill:  11      Follow-up: Return in about 6 months (around 11/28/2016).  Scarlette Calico, MD

## 2016-05-30 NOTE — Progress Notes (Signed)
Pre visit review using our clinic review tool, if applicable. No additional management support is needed unless otherwise documented below in the visit note. 

## 2016-05-30 NOTE — Patient Instructions (Signed)

## 2016-06-02 ENCOUNTER — Encounter: Payer: Self-pay | Admitting: Internal Medicine

## 2016-06-06 DIAGNOSIS — K130 Diseases of lips: Secondary | ICD-10-CM | POA: Diagnosis not present

## 2016-06-08 ENCOUNTER — Other Ambulatory Visit: Payer: Self-pay | Admitting: Internal Medicine

## 2016-06-08 DIAGNOSIS — E785 Hyperlipidemia, unspecified: Secondary | ICD-10-CM

## 2016-06-08 DIAGNOSIS — E118 Type 2 diabetes mellitus with unspecified complications: Secondary | ICD-10-CM

## 2016-06-12 ENCOUNTER — Telehealth: Payer: Self-pay | Admitting: Internal Medicine

## 2016-06-12 NOTE — Telephone Encounter (Signed)
Patient needs a Craig Staggers for his follow up with [facility] tomorrow. Their ph # is [phone] Contact is Necole

## 2016-06-13 DIAGNOSIS — M25562 Pain in left knee: Secondary | ICD-10-CM | POA: Diagnosis not present

## 2016-06-13 DIAGNOSIS — R262 Difficulty in walking, not elsewhere classified: Secondary | ICD-10-CM | POA: Diagnosis not present

## 2016-06-13 DIAGNOSIS — M170 Bilateral primary osteoarthritis of knee: Secondary | ICD-10-CM | POA: Diagnosis not present

## 2016-06-13 DIAGNOSIS — M25561 Pain in right knee: Secondary | ICD-10-CM | POA: Diagnosis not present

## 2016-06-18 NOTE — Telephone Encounter (Signed)
Referral # [id number] start date 06/13/16 exp 12/10/16 good for 6 visits.

## 2016-06-28 DIAGNOSIS — M1712 Unilateral primary osteoarthritis, left knee: Secondary | ICD-10-CM | POA: Diagnosis not present

## 2016-06-28 DIAGNOSIS — M25562 Pain in left knee: Secondary | ICD-10-CM | POA: Diagnosis not present

## 2016-07-09 DIAGNOSIS — M25562 Pain in left knee: Secondary | ICD-10-CM | POA: Diagnosis not present

## 2016-07-09 DIAGNOSIS — M1712 Unilateral primary osteoarthritis, left knee: Secondary | ICD-10-CM | POA: Diagnosis not present

## 2016-07-18 DIAGNOSIS — M25562 Pain in left knee: Secondary | ICD-10-CM | POA: Diagnosis not present

## 2016-07-18 DIAGNOSIS — M1712 Unilateral primary osteoarthritis, left knee: Secondary | ICD-10-CM | POA: Diagnosis not present

## 2016-07-25 DIAGNOSIS — M1712 Unilateral primary osteoarthritis, left knee: Secondary | ICD-10-CM | POA: Diagnosis not present

## 2016-07-25 DIAGNOSIS — M25562 Pain in left knee: Secondary | ICD-10-CM | POA: Diagnosis not present

## 2016-07-31 DIAGNOSIS — M1712 Unilateral primary osteoarthritis, left knee: Secondary | ICD-10-CM | POA: Diagnosis not present

## 2016-07-31 DIAGNOSIS — M25562 Pain in left knee: Secondary | ICD-10-CM | POA: Diagnosis not present

## 2016-09-11 ENCOUNTER — Other Ambulatory Visit: Payer: Self-pay | Admitting: Internal Medicine

## 2016-09-11 ENCOUNTER — Encounter: Payer: Self-pay | Admitting: Internal Medicine

## 2016-09-11 DIAGNOSIS — H2513 Age-related nuclear cataract, bilateral: Secondary | ICD-10-CM | POA: Diagnosis not present

## 2016-09-11 DIAGNOSIS — E118 Type 2 diabetes mellitus with unspecified complications: Secondary | ICD-10-CM

## 2016-09-11 DIAGNOSIS — H524 Presbyopia: Secondary | ICD-10-CM | POA: Diagnosis not present

## 2016-09-11 LAB — HM DIABETES EYE EXAM

## 2016-09-20 ENCOUNTER — Encounter: Payer: Self-pay | Admitting: Internal Medicine

## 2016-09-20 NOTE — Progress Notes (Signed)
Result abstracted 

## 2016-10-16 ENCOUNTER — Telehealth: Payer: Self-pay | Admitting: Internal Medicine

## 2016-10-16 NOTE — Telephone Encounter (Signed)
Yes, he will need an appt. Will you call and schedule that with any provider that has an opening.

## 2016-10-16 NOTE — Telephone Encounter (Signed)
Pt is having intermittent blood in urine. He's wondering if it could be the medication he is taking and he hasn't been able to afford the janumet and wondering if he should be taking it.  Can you please give him a call to see whats going on and if he will need an appointment

## 2016-10-17 ENCOUNTER — Ambulatory Visit (INDEPENDENT_AMBULATORY_CARE_PROVIDER_SITE_OTHER): Payer: Medicare HMO | Admitting: Internal Medicine

## 2016-10-17 ENCOUNTER — Other Ambulatory Visit: Payer: Medicare HMO

## 2016-10-17 ENCOUNTER — Encounter: Payer: Self-pay | Admitting: Internal Medicine

## 2016-10-17 ENCOUNTER — Other Ambulatory Visit (INDEPENDENT_AMBULATORY_CARE_PROVIDER_SITE_OTHER): Payer: Medicare HMO

## 2016-10-17 VITALS — BP 150/78 | HR 78 | Temp 98.0°F | Ht 70.0 in | Wt 258.0 lb

## 2016-10-17 DIAGNOSIS — I10 Essential (primary) hypertension: Secondary | ICD-10-CM | POA: Diagnosis not present

## 2016-10-17 DIAGNOSIS — R351 Nocturia: Secondary | ICD-10-CM

## 2016-10-17 DIAGNOSIS — E118 Type 2 diabetes mellitus with unspecified complications: Secondary | ICD-10-CM

## 2016-10-17 DIAGNOSIS — R3129 Other microscopic hematuria: Secondary | ICD-10-CM | POA: Diagnosis not present

## 2016-10-17 DIAGNOSIS — N401 Enlarged prostate with lower urinary tract symptoms: Secondary | ICD-10-CM

## 2016-10-17 LAB — CBC WITH DIFFERENTIAL/PLATELET
BASOS ABS: 0 10*3/uL (ref 0.0–0.1)
BASOS PCT: 0.5 % (ref 0.0–3.0)
EOS ABS: 0.3 10*3/uL (ref 0.0–0.7)
Eosinophils Relative: 2.6 % (ref 0.0–5.0)
HEMATOCRIT: 42.2 % (ref 39.0–52.0)
HEMOGLOBIN: 13.7 g/dL (ref 13.0–17.0)
LYMPHS PCT: 19.4 % (ref 12.0–46.0)
Lymphs Abs: 2 10*3/uL (ref 0.7–4.0)
MCHC: 32.4 g/dL (ref 30.0–36.0)
MCV: 80.5 fl (ref 78.0–100.0)
MONO ABS: 0.7 10*3/uL (ref 0.1–1.0)
Monocytes Relative: 6.8 % (ref 3.0–12.0)
Neutro Abs: 7.3 10*3/uL (ref 1.4–7.7)
Neutrophils Relative %: 70.7 % (ref 43.0–77.0)
Platelets: 193 10*3/uL (ref 150.0–400.0)
RBC: 5.24 Mil/uL (ref 4.22–5.81)
RDW: 15.6 % — AB (ref 11.5–15.5)
WBC: 10.3 10*3/uL (ref 4.0–10.5)

## 2016-10-17 LAB — BASIC METABOLIC PANEL
BUN: 11 mg/dL (ref 6–23)
CHLORIDE: 104 meq/L (ref 96–112)
CO2: 28 mEq/L (ref 19–32)
Calcium: 10 mg/dL (ref 8.4–10.5)
Creatinine, Ser: 1.1 mg/dL (ref 0.40–1.50)
GFR: 84.76 mL/min (ref >60.00)
GLUCOSE: 114 mg/dL — AB (ref 70–99)
POTASSIUM: 4.3 meq/L (ref 3.5–5.1)
SODIUM: 139 meq/L (ref 135–145)

## 2016-10-17 LAB — HEMOGLOBIN A1C: HEMOGLOBIN A1C: 7 % — AB (ref 4.6–6.5)

## 2016-10-17 LAB — POC URINALSYSI DIPSTICK (AUTOMATED)
Bilirubin, UA: NEGATIVE
Glucose, UA: NEGATIVE
Ketones, UA: NEGATIVE
Leukocytes, UA: NEGATIVE
NITRITE UA: NEGATIVE
PH UA: 6
PROTEIN UA: NEGATIVE
RBC UA: POSITIVE
SPEC GRAV UA: 1.025
UROBILINOGEN UA: 0.2

## 2016-10-17 LAB — PSA: PSA: 3.39 ng/mL (ref 0.10–4.00)

## 2016-10-17 MED ORDER — METFORMIN HCL ER 750 MG PO TB24: 1500.0000 mg | ORAL_TABLET | Freq: Every day | ORAL | 1 refills | Status: DC

## 2016-10-17 NOTE — Progress Notes (Signed)
Pre visit review using our clinic review tool, if applicable. No additional management support is needed unless otherwise documented below in the visit note. 

## 2016-10-17 NOTE — Patient Instructions (Signed)

## 2016-10-17 NOTE — Progress Notes (Signed)
Subjective:  Patient ID: Jesse Garrison, male    DOB: [birth year]  Age: 71 y.o. MRN: [id number]  CC: Hypertension and Diabetes   HPI Jesse Garrison presents for A 2 day history of painless hematuria and a slight increase in nocturia up to 2-3 times per night.  Outpatient Medications Prior to Visit  Medication Sig Dispense Refill  . albuterol (PROVENTIL HFA;VENTOLIN HFA) 108 (90 BASE) MCG/ACT inhaler Inhale 2 puffs into the lungs every 6 (six) hours as needed.    Marland Kitchen amLODipine (NORVASC) 5 MG tablet Take 1 tablet (5 mg total) by mouth daily. 90 tablet 3  . fluticasone furoate-vilanterol (BREO ELLIPTA) 100-25 MCG/INH AEPB Inhale 1 puff into the lungs every morning. 28 each 11  . losartan (COZAAR) 100 MG tablet Take 1 tablet (100 mg total) by mouth daily. 90 tablet 3  . nebivolol (BYSTOLIC) 5 MG tablet Take 1 tablet (5 mg total) by mouth daily. 30 tablet 11  . Omega-3 Krill Oil 1000 MG CAPS Take 1 tablet by mouth daily.    . pravastatin (PRAVACHOL) 40 MG tablet Take 1 tablet (40 mg total) by mouth daily. 30 tablet 11  . terazosin (HYTRIN) 5 MG capsule Take 5 mg by mouth at bedtime.      Marland Kitchen JANUMET XR 50-1000 MG TB24 TAKE 1 TABLET BY MOUTH DAILY. 90 tablet 0  . naproxen (NAPROSYN) 500 MG tablet Take 1 tablet (500 mg total) by mouth 2 (two) times daily. 14 tablet 0  . pravastatin (PRAVACHOL) 40 MG tablet Take 1 tablet (40 mg total) by mouth daily. 90 tablet 1  . SitaGLIPtin-MetFORMIN HCl (JANUMET XR) 50-1000 MG TB24 Take 1 tablet by mouth daily. 90 tablet 3   No facility-administered medications prior to visit.     ROS Review of Systems  Constitutional: Negative.  Negative for chills, fatigue and fever.  HENT: Negative.  Negative for trouble swallowing.   Eyes: Negative.   Respiratory: Negative.  Negative for cough, chest tightness, shortness of breath, wheezing and stridor.   Cardiovascular: Negative for chest pain, palpitations and leg swelling.  Gastrointestinal: Negative.  Negative for  abdominal pain, constipation, diarrhea, nausea and vomiting.  Endocrine: Negative.   Genitourinary: Positive for hematuria. Negative for decreased urine volume, difficulty urinating, discharge, dysuria, flank pain, frequency, genital sores, penile pain, penile swelling, scrotal swelling, testicular pain and urgency.  Musculoskeletal: Negative.  Negative for back pain and myalgias.  Skin: Negative.  Negative for color change and rash.  Allergic/Immunologic: Negative.   Neurological: Negative.  Negative for dizziness and weakness.  Hematological: Negative.  Negative for adenopathy. Does not bruise/bleed easily.  Psychiatric/Behavioral: Negative.     Objective:  BP (!) 150/78 (BP Location: Left Arm, Patient Position: Sitting, Cuff Size: Large)   Pulse 78   Temp 98 F (36.7 C) (Oral)   Ht '5\' 10"'$  (1.778 m)   Wt 258 lb (117 kg)   SpO2 (!) 89%   BMI 37.02 kg/m   BP Readings from Last 3 Encounters:  10/18/16 140/72  10/17/16 (!) 150/78  05/30/16 (!) 150/80    Wt Readings from Last 3 Encounters:  10/18/16 258 lb (117 kg)  10/17/16 258 lb (117 kg)  05/30/16 257 lb 8 oz (116.8 kg)    Physical Exam  Constitutional: He is oriented to person, place, and time. No distress.  HENT:  Mouth/Throat: Oropharynx is clear and moist. No oropharyngeal exudate.  Eyes: Conjunctivae are normal. Right eye exhibits no discharge. Left eye exhibits no discharge. No scleral icterus.  Neck: Normal range of motion. Neck supple. No JVD present. No tracheal deviation present. No thyromegaly present.  Cardiovascular: Normal rate, regular rhythm, normal heart sounds and intact distal pulses.  Exam reveals no gallop and no friction rub.   No murmur heard. Pulmonary/Chest: Effort normal and breath sounds normal. No stridor. No respiratory distress. He has no wheezes. He has no rales. He exhibits no tenderness.  Abdominal: Soft. Bowel sounds are normal. He exhibits no distension and no mass. There is no tenderness.  There is no rebound and no guarding. Hernia confirmed negative in the right inguinal area and confirmed negative in the left inguinal area.  Genitourinary: Rectum normal, testes normal and penis normal. Rectal exam shows no external hemorrhoid, no internal hemorrhoid, no fissure, no mass, no tenderness, anal tone normal and guaiac negative stool. Prostate is enlarged (1+ smooth symm BPH, non-tender). Prostate is not tender. Right testis shows no mass, no swelling and no tenderness. Right testis is descended. Left testis shows no mass, no swelling and no tenderness. Left testis is descended. Circumcised. No penile erythema or penile tenderness. No discharge found.  Musculoskeletal: Normal range of motion. He exhibits no edema, tenderness or deformity.  Lymphadenopathy:    He has no cervical adenopathy.       Right: No inguinal adenopathy present.       Left: No inguinal adenopathy present.  Neurological: He is oriented to person, place, and time.  Skin: Skin is warm and dry. No rash noted. He is not diaphoretic. No erythema. No pallor.  Vitals reviewed.   Lab Results  Component Value Date   WBC 10.3 10/17/2016   HGB 13.7 10/17/2016   HCT 42.2 10/17/2016   PLT 193.0 10/17/2016   GLUCOSE 114 (H) 10/17/2016   CHOL 135 05/30/2016   TRIG 99.0 05/30/2016   HDL 39.80 05/30/2016   LDLCALC 75 05/30/2016   ALT 14 01/02/2016   AST 14 01/02/2016   NA 139 10/17/2016   K 4.3 10/17/2016   CL 104 10/17/2016   CREATININE 1.10 10/17/2016   BUN 11 10/17/2016   CO2 28 10/17/2016   TSH 0.72 08/04/2015   PSA 3.39 10/17/2016   HGBA1C 7.0 (H) 10/17/2016   MICROALBUR 1.3 08/04/2015    Dg Wrist Complete Right  Result Date: 04/04/2016 CLINICAL DATA:  Right wrist pain for 2 days. "Gout like" per patient. EXAM: RIGHT WRIST - COMPLETE 3+ VIEW COMPARISON:  None. FINDINGS: No acute fracture or malalignment. Nonspecific carpal cystic changes most notable in the scaphoid. No calcified soft tissue mass. No  notable degenerative changes for age IMPRESSION: No acute finding or specific explanation for pain. Electronically Signed   By: Monte Fantasia M.D.   On: 04/04/2016 19:00    Assessment & Plan:   Porter was seen today for hypertension and diabetes.  Diagnoses and all orders for this visit:  Essential hypertension- His blood pressure is well-controlled, electrolytes and renal function are normal. -     CBC with Differential/Platelet; Future -     Basic metabolic panel; Future  Type 2 diabetes mellitus with complication, without long-term current use of insulin ([place])- his blood sugars are adequately well controlled -     Basic metabolic panel; Future -     Hemoglobin A1c; Future -     metFORMIN (GLUCOPHAGE-XR) 750 MG 24 hr tablet; Take 2 tablets (1,500 mg total) by mouth daily with breakfast.  Other microscopic hematuria- he has a moderate amount of blood in his urine but no [place]  blood cells or nitrites so I don't think this is a bacterial infectious process. I have asked him to undergo a renal CT scan to screen for renal stones, bladder mass, renal mass, hydronephrosis. -     CULTURE, URINE COMPREHENSIVE; Future -     POCT Urinalysis Dipstick (Automated) -     CT RENAL STONE STUDY; Future  Benign prostatic hyperplasia with nocturia- his PSA is not rising some I am not concerned about prostate cancer, will continue Hytrin for symptom relief -     PSA; Future   I have discontinued Mr. Bohlken's SitaGLIPtin-MetFORMIN HCl, naproxen, and JANUMET XR. I am also having him start on metFORMIN. Additionally, I am having him maintain his terazosin, albuterol, Omega-3 Krill Oil, amLODipine, losartan, nebivolol, fluticasone furoate-vilanterol, and pravastatin.  Meds ordered this encounter  Medications  . metFORMIN (GLUCOPHAGE-XR) 750 MG 24 hr tablet    Sig: Take 2 tablets (1,500 mg total) by mouth daily with breakfast.    Dispense:  180 tablet    Refill:  1     Follow-up: Return in about 4  weeks (around 11/14/2016).  Scarlette Calico, MD

## 2016-10-18 ENCOUNTER — Telehealth: Payer: Self-pay | Admitting: Internal Medicine

## 2016-10-18 ENCOUNTER — Emergency Department (HOSPITAL_COMMUNITY)
Admission: EM | Admit: 2016-10-18 | Discharge: 2016-10-18 | Disposition: A | Discharge status: Home / Self Care | Payer: Medicare HMO | Attending: Emergency Medicine | Admitting: Emergency Medicine

## 2016-10-18 ENCOUNTER — Encounter (HOSPITAL_COMMUNITY): Payer: Self-pay | Admitting: Emergency Medicine

## 2016-10-18 ENCOUNTER — Emergency Department (HOSPITAL_COMMUNITY): Payer: Medicare HMO

## 2016-10-18 DIAGNOSIS — Z7984 Long term (current) use of oral hypoglycemic drugs: Secondary | ICD-10-CM | POA: Insufficient documentation

## 2016-10-18 DIAGNOSIS — N3001 Acute cystitis with hematuria: Secondary | ICD-10-CM | POA: Insufficient documentation

## 2016-10-18 DIAGNOSIS — Z79899 Other long term (current) drug therapy: Secondary | ICD-10-CM | POA: Diagnosis not present

## 2016-10-18 DIAGNOSIS — J449 Chronic obstructive pulmonary disease, unspecified: Secondary | ICD-10-CM | POA: Insufficient documentation

## 2016-10-18 DIAGNOSIS — E119 Type 2 diabetes mellitus without complications: Secondary | ICD-10-CM | POA: Diagnosis not present

## 2016-10-18 DIAGNOSIS — I10 Essential (primary) hypertension: Secondary | ICD-10-CM | POA: Diagnosis not present

## 2016-10-18 DIAGNOSIS — N3090 Cystitis, unspecified without hematuria: Secondary | ICD-10-CM | POA: Diagnosis not present

## 2016-10-18 DIAGNOSIS — R319 Hematuria, unspecified: Secondary | ICD-10-CM | POA: Diagnosis not present

## 2016-10-18 DIAGNOSIS — Z87891 Personal history of nicotine dependence: Secondary | ICD-10-CM | POA: Insufficient documentation

## 2016-10-18 DIAGNOSIS — N3091 Cystitis, unspecified with hematuria: Secondary | ICD-10-CM

## 2016-10-18 MED ORDER — CIPROFLOXACIN HCL 500 MG PO TABS: 500.0000 mg | ORAL_TABLET | Freq: Two times a day (BID) | ORAL | 0 refills | Status: DC

## 2016-10-18 NOTE — ED Notes (Signed)
Pt given ice water per Angela(RN)

## 2016-10-18 NOTE — ED Triage Notes (Signed)
Pt was seen at PCP yesterday and had Blood work done

## 2016-10-18 NOTE — Telephone Encounter (Signed)
Patient Name: Jesse Garrison DOB: 1946/02/08 Initial Comment Caller states seen Dr Ronnald Ramp yesterday for uti and urinating blood. Was clear urine but about a half hour ago woke up wet in groin area and it was a lot of blood. Nurse Assessment Nurse: Marcelline Deist, RN, Lynda Date/Time (Eastern Time): 10/18/2016 10:42:41 AM Confirm and document reason for call. If symptomatic, describe symptoms. ---Caller states seen Dr. Ronnald Ramp yesterday for uti and urinating blood. Was clear urine but about a half hour ago woke up wet in groin area and it was a lot of blood. Took urine sample & blood work. Was going to refer to kidney specialist. Already taking rxs. Does the patient have any new or worsening symptoms? ---Yes Will a triage be completed? ---Yes Related visit to physician within the last 2 weeks? ---Yes Does the PT have any chronic conditions? (i.e. diabetes, asthma, etc.) ---Yes List chronic conditions. ---on BP rx, respiratory rxs, diabetes Is this a behavioral health or substance abuse call? ---No Guidelines Guideline Title Affirmed Question Affirmed Notes Urine - Blood In Passing pure blood or large blood clots (i.e., size > a dime) (Exception: fleck or small strands) Final Disposition User Go to ED Now Marcelline Deist, RN, Kermit Balo Comments Clarification: Caller states Dr. Ronnald Ramp mentioned that he could have UTI or kidney stones, did not put on antibiotic. Disagree/Comply: Comply

## 2016-10-18 NOTE — ED Provider Notes (Signed)
[facility] DEPT Provider Note   CSN: [id number] Arrival date & time: 10/18/16  1144  By signing my name below, I, Jesse Garrison, attest that this documentation has been prepared under the direction and in the presence of Veryl Speak, MD . Electronically Signed: Ethelle Lyon Garrison, Scribe. 10/18/2016. 12:59 PM.  History   Chief Complaint Chief Complaint  Patient presents with  . Hematuria   The history is provided by the patient. No language interpreter was used.  Hematuria  This is a new problem. The current episode started more than 2 days ago. The problem occurs every several days. He has tried nothing for the symptoms. The treatment provided no relief.    HPI Comments:  Jesse Garrison is a 71 y.o. male with a PMHx of BPH, COPD, DM2, HLD, HTN, and Osteoarthritis, who presents to the Emergency Department complaining of hematuria onset three days ago. He states he went to [facility] yesterday and got urine tests, blood tests, and a rectal exam that came back negative (urine results are not back yet) following an episode of hematuria three days ago. Later that night, he noticed he slightly urinated on himself but the urine was clear. This morning he woke up and had the hematuria had returned. Pt has an associated symptom of frequency. No h/o renal calculi or prostate issues. Pt is not currently on anticoagulant or antiplatelet therapy. Pt has a heart murmer but no other significant cardiac issues or events. He did not try anything for relief of his pain PTA. Pt denies dysuria, back pain, and any other complaints at this time.   Past Medical History:  Diagnosis Date  . BPH (benign prostatic hyperplasia)   . COPD (chronic obstructive pulmonary disease) ([place])   . Diabetes ([place])   . Hyperlipidemia   . Hypertension   . Osteoarthritis   . Personal history of colonic adenomas 05/08/2013    Patient Active Problem List   Diagnosis Date Noted  . Other microscopic hematuria  10/17/2016  . Idiopathic gout 05/30/2016  . Obesity (BMI 35.0-39.9 without comorbidity) 02/28/2016  . Lesion of lip 01/02/2016  . Knee pain, chronic 08/04/2015  . Mass of lung 11/16/2013  . Personal history of colonic adenomas 05/08/2013  . Hyperlipidemia with target LDL less than 100 02/16/2013  . Routine general medical examination at a health care facility 06/11/2012  . Snoring 12/18/2011  . Cataract 12/18/2011  . Diabetes mellitus type 2 with complications ([place]) 08/67/6195  . Essential hypertension 07/27/2010  . COPD with emphysema ([place]) 07/27/2010  . BPH (benign prostatic hyperplasia) 07/27/2010  . OSTEOARTHRITIS 07/27/2010    Past Surgical History:  Procedure Laterality Date  . fatty tissue removal  1996   forehead     Home Medications    Prior to Admission medications   Medication Sig Start Date End Date Taking? Authorizing Provider  albuterol (PROVENTIL HFA;VENTOLIN HFA) 108 (90 BASE) MCG/ACT inhaler Inhale 2 puffs into the lungs every 6 (six) hours as needed. 04/12/11   Janith Lima, MD  amLODipine (NORVASC) 5 MG tablet Take 1 tablet (5 mg total) by mouth daily. 01/02/16   Janith Lima, MD  fluticasone furoate-vilanterol (BREO ELLIPTA) 100-25 MCG/INH AEPB Inhale 1 puff into the lungs every morning. 05/30/16   Janith Lima, MD  losartan (COZAAR) 100 MG tablet Take 1 tablet (100 mg total) by mouth daily. 01/02/16   Janith Lima, MD  metFORMIN (GLUCOPHAGE-XR) 750 MG 24 hr tablet Take 2 tablets (1,500 mg total)  by mouth daily with breakfast. 10/17/16   Janith Lima, MD  nebivolol (BYSTOLIC) 5 MG tablet Take 1 tablet (5 mg total) by mouth daily. 02/28/16   Janith Lima, MD  Omega-3 Krill Oil 1000 MG CAPS Take 1 tablet by mouth daily.    Historical Provider, MD  pravastatin (PRAVACHOL) 40 MG tablet Take 1 tablet (40 mg total) by mouth daily. 05/30/16   Janith Lima, MD  terazosin (HYTRIN) 5 MG capsule Take 5 mg by mouth at bedtime.      Historical Provider, MD     Family History Family History  Problem Relation Age of Onset  . Diabetes Other   . Emphysema Mother     smoked  . Colon cancer Neg Hx   . Esophageal cancer Neg Hx   . Rectal cancer Neg Hx   . Stomach cancer Neg Hx     Social History Social History  Substance Use Topics  . Smoking status: Former Smoker    Packs/day: 1.00    Years: 50.00    Types: Cigarettes    Quit date: 06/19/2011  . Smokeless tobacco: Never Used  . Alcohol use 0.6 oz/week    1 Cans of beer per week     Allergies   Patient has no known allergies.   Review of Systems Review of Systems  Genitourinary: Positive for frequency and hematuria. Negative for dysuria.  Musculoskeletal: Negative for back pain.  All other systems reviewed and are negative.    Physical Exam Updated Vital Signs BP 140/73 (BP Location: Right Arm)   Pulse 78   Temp 98.8 F (37.1 C) (Oral)   Resp 16   Ht '5\' 10"'$  (1.778 m)   Wt 258 lb (117 kg)   SpO2 97%   BMI 37.02 kg/m   Physical Exam  Constitutional: He appears well-developed and well-nourished. No distress.  HENT:  Head: Normocephalic and atraumatic.  Mouth/Throat: Oropharynx is clear and moist. No oropharyngeal exudate.  Eyes: Conjunctivae are normal. Pupils are equal, round, and reactive to light. Right eye exhibits no discharge. Left eye exhibits no discharge. No scleral icterus.  Neck: Normal range of motion. Neck supple. No thyromegaly present.  Cardiovascular: Normal rate, regular rhythm, normal heart sounds and intact distal pulses.  Exam reveals no gallop and no friction rub.   No murmur heard. Pulmonary/Chest: Effort normal and breath sounds normal. No stridor. No respiratory distress. He has no wheezes. He has no rales.  Abdominal: Soft. Bowel sounds are normal. He exhibits no distension. There is no tenderness. There is no rebound and no guarding.  Musculoskeletal: He exhibits no edema.  Lymphadenopathy:    He has no cervical adenopathy.   Neurological: He is alert. Coordination normal.  Skin: Skin is warm and dry. No rash noted. He is not diaphoretic. No pallor.  Psychiatric: He has a normal mood and affect.  Nursing note and vitals reviewed.    ED Treatments / Results  DIAGNOSTIC STUDIES:  Oxygen Saturation is 97% on RA, normal by my interpretation.    COORDINATION OF CARE:  12:54 PM Discussed treatment plan with pt at bedside including CT renal and pt agreed to plan.  Labs (all labs ordered are listed, but only abnormal results are displayed) Labs Reviewed - No data to display  EKG  EKG Interpretation None       Radiology No results found.  Procedures Procedures (including critical care time)  Medications Ordered in ED Medications - No data to display  Initial Impression / Assessment and Plan / ED Course  I have reviewed the triage vital signs and the nursing notes.  Pertinent labs & imaging results that were available during my care of the patient were reviewed by me and considered in my medical decision making (see chart for details).  CT scan is negative for renal mass, stone, or other abnormality. There are no obvious abnormalities within the bladder. He will be treated with Cipro for a possible hemorrhagic cystitis and will be advised to follow-up with his primary doctor tomorrow as scheduled. He may need referral to urology to discuss a cystoscopy This will be left to the discretion of his primary doctor.  Final Clinical Impressions(s) / ED Diagnoses   Final diagnoses:  None    New Prescriptions New Prescriptions   No medications on file   I personally performed the services described in this documentation, which was scribed in my presence. The recorded information has been reviewed and is accurate.        Veryl Speak, MD 10/18/16 [phone]

## 2016-10-18 NOTE — Telephone Encounter (Signed)
There was no evidence of infection in his urine so an antibiotic was not sent The urine culture is not back yet He needs to get the CT scan done asap

## 2016-10-18 NOTE — ED Triage Notes (Signed)
Pt has been having blood in his urine for the last three days. Yesterday his urine was clear and today he went to the restroom and noticed blood in his under pants and also bright red blood with urination. Pt denies any pain.

## 2016-10-18 NOTE — Telephone Encounter (Signed)
Pt went to ED and they did the CT there.

## 2016-10-18 NOTE — Discharge Instructions (Signed)
Cipro as prescribed.  Follow-up with your primary doctor as scheduled to discuss referral to urology for possible cystoscopy.  Return to the ER if symptoms worsen or change.

## 2016-10-20 LAB — CULTURE, URINE COMPREHENSIVE

## 2016-12-31 ENCOUNTER — Encounter: Payer: Self-pay | Admitting: Internal Medicine

## 2016-12-31 ENCOUNTER — Ambulatory Visit (INDEPENDENT_AMBULATORY_CARE_PROVIDER_SITE_OTHER): Payer: Medicare HMO | Admitting: Internal Medicine

## 2016-12-31 ENCOUNTER — Other Ambulatory Visit: Payer: Self-pay | Admitting: Internal Medicine

## 2016-12-31 ENCOUNTER — Other Ambulatory Visit (INDEPENDENT_AMBULATORY_CARE_PROVIDER_SITE_OTHER): Payer: Medicare HMO

## 2016-12-31 VITALS — BP 136/74 | HR 88 | Temp 98.3°F | Resp 16 | Ht 70.0 in | Wt 257.5 lb

## 2016-12-31 DIAGNOSIS — R918 Other nonspecific abnormal finding of lung field: Secondary | ICD-10-CM

## 2016-12-31 DIAGNOSIS — E118 Type 2 diabetes mellitus with unspecified complications: Secondary | ICD-10-CM

## 2016-12-31 DIAGNOSIS — I10 Essential (primary) hypertension: Secondary | ICD-10-CM | POA: Diagnosis not present

## 2016-12-31 DIAGNOSIS — A599 Trichomoniasis, unspecified: Secondary | ICD-10-CM

## 2016-12-31 LAB — BASIC METABOLIC PANEL
BUN: 15 mg/dL (ref 6–23)
CO2: 27 mEq/L (ref 19–32)
CREATININE: 1.18 mg/dL (ref 0.40–1.50)
Calcium: 9.6 mg/dL (ref 8.4–10.5)
Chloride: 105 mEq/L (ref 96–112)
GFR: 78.12 mL/min (ref >60.00)
GLUCOSE: 134 mg/dL — AB (ref 70–99)
POTASSIUM: 4.4 meq/L (ref 3.5–5.1)
Sodium: 138 mEq/L (ref 135–145)

## 2016-12-31 LAB — URINALYSIS, ROUTINE W REFLEX MICROSCOPIC
BILIRUBIN URINE: NEGATIVE
HGB URINE DIPSTICK: NEGATIVE
KETONES UR: NEGATIVE
LEUKOCYTES UA: NEGATIVE
NITRITE: NEGATIVE
RBC / HPF: NONE SEEN (ref 0–?)
SPECIFIC GRAVITY, URINE: 1.02 (ref 1.000–1.030)
TOTAL PROTEIN, URINE-UPE24: NEGATIVE
URINE GLUCOSE: NEGATIVE
Urobilinogen, UA: 0.2 (ref 0.0–1.0)
pH: 5.5 (ref 5.0–8.0)

## 2016-12-31 LAB — HEMOGLOBIN A1C: HEMOGLOBIN A1C: 7.6 % — AB (ref 4.6–6.5)

## 2016-12-31 LAB — MICROALBUMIN / CREATININE URINE RATIO
Creatinine,U: 108.4 mg/dL
MICROALB/CREAT RATIO: 0.7 mg/g (ref 0.0–30.0)
Microalb, Ur: 0.8 mg/dL (ref 0.0–1.9)

## 2016-12-31 MED ORDER — METRONIDAZOLE 500 MG PO TABS: 500.0000 mg | ORAL_TABLET | Freq: Two times a day (BID) | ORAL | 0 refills | Status: AC

## 2016-12-31 NOTE — Progress Notes (Signed)
Subjective:  Patient ID: Jesse Garrison, male    DOB: 1945/08/17  Age: 71 y.o. MRN: [id number]  CC: COPD; Diabetes; and Hypertension   HPI Jesse Garrison presents for f/up - He tells me that he has been in his usual state of health with the occasional nonproductive cough and shortness of breath but he is getting excellent symptom relief with the ICS/LABA combination inh. He was recently seen in the ED for an episode of cystitis and tells me that he has had no recent episodes of dysuria, hematuria, bladder or flank pain. He is due for follow-up regarding his history of lung mass.  Outpatient Medications Prior to Visit  Medication Sig Dispense Refill  . albuterol (PROVENTIL HFA;VENTOLIN HFA) 108 (90 BASE) MCG/ACT inhaler Inhale 2 puffs into the lungs every 6 (six) hours as needed.    Marland Kitchen amLODipine (NORVASC) 5 MG tablet Take 1 tablet (5 mg total) by mouth daily. 90 tablet 3  . fluticasone furoate-vilanterol (BREO ELLIPTA) 100-25 MCG/INH AEPB Inhale 1 puff into the lungs every morning. 28 each 11  . losartan (COZAAR) 100 MG tablet Take 1 tablet (100 mg total) by mouth daily. 90 tablet 3  . metFORMIN (GLUCOPHAGE-XR) 750 MG 24 hr tablet Take 2 tablets (1,500 mg total) by mouth daily with breakfast. 180 tablet 1  . nebivolol (BYSTOLIC) 5 MG tablet Take 1 tablet (5 mg total) by mouth daily. 30 tablet 11  . Omega-3 Krill Oil 1000 MG CAPS Take 1 tablet by mouth daily.    . pravastatin (PRAVACHOL) 40 MG tablet Take 1 tablet (40 mg total) by mouth daily. 30 tablet 11  . terazosin (HYTRIN) 5 MG capsule Take 5 mg by mouth at bedtime.      . ciprofloxacin (CIPRO) 500 MG tablet Take 1 tablet (500 mg total) by mouth 2 (two) times daily. One po bid x 7 days 14 tablet 0   No facility-administered medications prior to visit.     ROS Review of Systems  Constitutional: Negative.  Negative for activity change, chills, diaphoresis, fatigue and unexpected weight change.  HENT: Negative.   Eyes: Negative.     Respiratory: Positive for cough and shortness of breath. Negative for wheezing and stridor.   Cardiovascular: Negative.  Negative for chest pain, palpitations and leg swelling.  Gastrointestinal: Negative for abdominal pain, constipation, diarrhea, nausea and vomiting.  Endocrine: Negative.   Genitourinary: Negative.  Negative for difficulty urinating, dysuria, flank pain, frequency, hematuria, scrotal swelling and urgency.  Musculoskeletal: Negative.   Skin: Negative.   Allergic/Immunologic: Negative.   Neurological: Negative.  Negative for dizziness, weakness, light-headedness and headaches.  Hematological: Negative for adenopathy. Does not bruise/bleed easily.  Psychiatric/Behavioral: Negative.     Objective:  BP 136/74 (BP Location: Left Arm, Patient Position: Sitting, Cuff Size: Large)   Pulse 88   Temp 98.3 F (36.8 C) (Oral)   Resp 16   Ht '5\' 10"'$  (1.778 m)   Wt 257 lb 8 oz (116.8 kg)   SpO2 93%   BMI 36.95 kg/m   BP Readings from Last 3 Encounters:  12/31/16 136/74  10/18/16 140/72  10/17/16 (!) 150/78    Wt Readings from Last 3 Encounters:  12/31/16 257 lb 8 oz (116.8 kg)  10/18/16 258 lb (117 kg)  10/17/16 258 lb (117 kg)    Physical Exam  Constitutional: He is oriented to person, place, and time.  Non-toxic appearance. He does not have a sickly appearance. He does not appear ill. No distress.  HENT:  Mouth/Throat: Oropharynx is clear and moist. No oropharyngeal exudate.  Eyes: Conjunctivae are normal. Right eye exhibits no discharge. Left eye exhibits no discharge. No scleral icterus.  Neck: Normal range of motion. Neck supple. No JVD present. No tracheal deviation present. No thyromegaly present.  Cardiovascular: Normal rate, regular rhythm, normal heart sounds and intact distal pulses.  Exam reveals no gallop and no friction rub.   No murmur heard. Pulmonary/Chest: Effort normal. No accessory muscle usage or stridor. No tachypnea. No respiratory distress. He  has no decreased breath sounds. He has no wheezes. He has rhonchi in the right middle field and the left middle field. He has no rales. He exhibits no tenderness.  Abdominal: Soft. Bowel sounds are normal. He exhibits no distension and no mass. There is no tenderness. There is no rebound and no guarding.  Musculoskeletal: Normal range of motion. He exhibits no edema or tenderness.  Lymphadenopathy:    He has no cervical adenopathy.  Neurological: He is oriented to person, place, and time.  Skin: Skin is warm and dry. No rash noted. He is not diaphoretic. No erythema. No pallor.  Vitals reviewed.   Lab Results  Component Value Date   WBC 10.3 10/17/2016   HGB 13.7 10/17/2016   HCT 42.2 10/17/2016   PLT 193.0 10/17/2016   GLUCOSE 134 (H) 12/31/2016   CHOL 135 05/30/2016   TRIG 99.0 05/30/2016   HDL 39.80 05/30/2016   LDLCALC 75 05/30/2016   ALT 14 01/02/2016   AST 14 01/02/2016   NA 138 12/31/2016   K 4.4 12/31/2016   CL 105 12/31/2016   CREATININE 1.18 12/31/2016   BUN 15 12/31/2016   CO2 27 12/31/2016   TSH 0.72 08/04/2015   PSA 3.39 10/17/2016   HGBA1C 7.6 (H) 12/31/2016   MICROALBUR 0.8 12/31/2016    Ct Renal Stone Study  Result Date: 10/18/2016 CLINICAL DATA:  Hematuria for 6 days.  No pain. EXAM: CT ABDOMEN AND PELVIS WITHOUT CONTRAST TECHNIQUE: Multidetector CT imaging of the abdomen and pelvis was performed following the standard protocol without IV contrast. COMPARISON:  PET-CT 03/22/2014 FINDINGS: Lower chest: Mild bilateral emphysematous changes. No focal consolidation or pleural effusion. Hepatobiliary: No focal liver abnormality is seen. No gallstones, gallbladder wall thickening, or biliary dilatation. Pancreas: Unremarkable. No pancreatic ductal dilatation or surrounding inflammatory changes. Spleen: Normal in size without focal abnormality. Adrenals/Urinary Tract: Bilateral adrenal hyperplasia. Kidneys are normal, without renal calculi, focal lesion, or  hydronephrosis. Bladder is unremarkable. Stomach/Bowel: Stomach is within normal limits. Diverticulosis without evidence of diverticulitis. Appendix is normal. No evidence of bowel wall thickening, distention, or inflammatory changes. Vascular/Lymphatic: Abdominal aortic atherosclerosis. Normal caliber abdominal aorta. No lymphadenopathy. Reproductive: Prostatic enlargement. Other: No fluid collection or hematoma. Musculoskeletal: No acute osseous abnormality. Ankylosis of bilateral sacroiliac joints. Bilateral facet arthropathy throughout the lumbar spine most severe at L4-5. IMPRESSION: 1. No acute abdominal or pelvic pathology. 2. No urolithiasis or obstructive uropathy. 3. Normal appendix. 4. Diverticulosis without evidence of diverticulitis. 5.  Aortic Atherosclerosis (ICD10-170.0) 6.  Emphysema. (TFT73-U20.9) Electronically Signed   By: Kathreen Devoid   On: 10/18/2016 13:44    Assessment & Plan:   Raylee was seen today for copd, diabetes and hypertension.  Diagnoses and all orders for this visit:  Mass of lung- He will undergo another CT scan of the chest to see if there has been a progression of the lung mass, he also tells me he has a follow-up with pulmonary within the next few months. -  CT CHEST W CONTRAST; Future  Type 2 diabetes mellitus with complication, without long-term current use of insulin ([place])- his A1c is up to 7.6%, will continue metformin and of asked him to work on his lifestyle modifications to lower his blood sugar. -     Hemoglobin A1c; Future -     Microalbumin / creatinine urine ratio; Future  Essential hypertension- his blood pressure is well-controlled, electrolytes and renal function are normal. -     Basic metabolic panel; Future -     Urinalysis, Routine w reflex microscopic; Future  Trichomonas infection- his urine was positive for trichomonads so will treat with a 7 day course of metronidazole -     metroNIDAZOLE (FLAGYL) 500 MG tablet; Take 1 tablet (500 mg  total) by mouth 2 (two) times daily.   I have discontinued Mr. Wentling's ciprofloxacin. I am also having him start on metroNIDAZOLE. Additionally, I am having him maintain his terazosin, albuterol, Omega-3 Krill Oil, amLODipine, losartan, nebivolol, fluticasone furoate-vilanterol, pravastatin, and metFORMIN.  Meds ordered this encounter  Medications  . metroNIDAZOLE (FLAGYL) 500 MG tablet    Sig: Take 1 tablet (500 mg total) by mouth 2 (two) times daily.    Dispense:  14 tablet    Refill:  0     Follow-up: Return in about 6 months (around 07/03/2017).  Scarlette Calico, MD

## 2016-12-31 NOTE — Patient Instructions (Signed)

## 2017-01-01 ENCOUNTER — Telehealth: Payer: Self-pay | Admitting: Internal Medicine

## 2017-01-01 NOTE — Telephone Encounter (Signed)
Contacted pt and he wanted to know how he could come back positive for trichamonas is he has not been sexually active.  Informed pt that I would resend message to Dr. Ronnald Ramp.  Please advise.

## 2017-01-01 NOTE — Telephone Encounter (Signed)
Pt informed of PCP response.  Pt stated understanding.

## 2017-01-01 NOTE — Telephone Encounter (Signed)
Pt called in and has a questions about his meds that was given to him yesterday   Best number is [phone]

## 2017-01-01 NOTE — Telephone Encounter (Signed)
Could be a toilet seat in a public bathroom

## 2017-01-04 ENCOUNTER — Other Ambulatory Visit: Payer: Self-pay | Admitting: Internal Medicine

## 2017-01-04 ENCOUNTER — Ambulatory Visit (INDEPENDENT_AMBULATORY_CARE_PROVIDER_SITE_OTHER)
Admission: RE | Admit: 2017-01-04 | Discharge: 2017-01-04 | Disposition: A | Discharge status: Home / Self Care | Payer: Medicare HMO | Source: Ambulatory Visit | Attending: Internal Medicine | Admitting: Internal Medicine

## 2017-01-04 DIAGNOSIS — R918 Other nonspecific abnormal finding of lung field: Secondary | ICD-10-CM | POA: Diagnosis not present

## 2017-01-04 DIAGNOSIS — E118 Type 2 diabetes mellitus with unspecified complications: Secondary | ICD-10-CM

## 2017-01-04 DIAGNOSIS — I10 Essential (primary) hypertension: Secondary | ICD-10-CM

## 2017-01-04 DIAGNOSIS — Z836 Family history of other diseases of the respiratory system: Secondary | ICD-10-CM | POA: Diagnosis not present

## 2017-01-04 MED ORDER — IOPAMIDOL (ISOVUE-300) INJECTION 61%
80.0000 mL | Freq: Once | INTRAVENOUS | Status: AC | PRN
  Administered 2017-01-04: 80 mL via INTRAVENOUS

## 2017-01-21 ENCOUNTER — Telehealth: Payer: Self-pay | Admitting: Internal Medicine

## 2017-01-21 NOTE — Telephone Encounter (Signed)
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  States there should have been a form faxed to Dr. Ronnald Ramp last week for him to complete additional information.  Do either of you have this form?

## 2017-01-21 NOTE — Telephone Encounter (Signed)
I HAVE NO NOT SEEN NO FORM FOR HIM.

## 2017-01-23 NOTE — Telephone Encounter (Signed)
Patient is calling about this form again. Have you seen this form?

## 2017-01-23 NOTE — Telephone Encounter (Signed)
Pt called regarding this, he will refax to [phone]. He did not have the paper with the fax number he sent it to before.  He is putting attn Stefannie   He would like it faxed back to [phone]

## 2017-01-23 NOTE — Telephone Encounter (Signed)
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   Please fax to [phone]

## 2017-01-23 NOTE — Telephone Encounter (Signed)
Form signed and given to [place]

## 2017-01-23 NOTE — Telephone Encounter (Signed)
I have form. Given to pcp

## 2017-01-23 NOTE — Telephone Encounter (Signed)
Pt called back in said that he just faxed form and needs it back by 18th

## 2017-01-23 NOTE — Telephone Encounter (Signed)
Form has been completed and signed. Faxed to both number 6/13, sent to scan. 6/13 BO

## 2017-01-23 NOTE — Telephone Encounter (Signed)
I have not seen a form for this patient. When was it to be faxed and what number did they fax it to?

## 2017-01-28 NOTE — Telephone Encounter (Signed)
This has been faxed.

## 2017-01-29 NOTE — Telephone Encounter (Signed)
Pt called in and said that they have not rec'd this fax.  Can it be refaxed   [phone]- fax number

## 2017-01-29 NOTE — Telephone Encounter (Signed)
  This has been refaxed. If he calls back will you ask him to pick up please.

## 2017-02-04 NOTE — Telephone Encounter (Signed)
Please refax A1C to [phone] They still did not receive it.   Pt is in [place]

## 2017-02-05 NOTE — Telephone Encounter (Signed)
This was faxed yesterday afternoon.

## 2017-05-01 ENCOUNTER — Other Ambulatory Visit (INDEPENDENT_AMBULATORY_CARE_PROVIDER_SITE_OTHER): Payer: Medicare HMO

## 2017-05-01 ENCOUNTER — Ambulatory Visit (INDEPENDENT_AMBULATORY_CARE_PROVIDER_SITE_OTHER): Payer: Medicare HMO | Admitting: Internal Medicine

## 2017-05-01 ENCOUNTER — Encounter: Payer: Self-pay | Admitting: Internal Medicine

## 2017-05-01 VITALS — BP 146/72 | HR 82 | Temp 98.9°F | Resp 18 | Ht 70.0 in | Wt 253.0 lb

## 2017-05-01 DIAGNOSIS — A599 Trichomoniasis, unspecified: Secondary | ICD-10-CM | POA: Diagnosis not present

## 2017-05-01 DIAGNOSIS — J439 Emphysema, unspecified: Secondary | ICD-10-CM

## 2017-05-01 DIAGNOSIS — E118 Type 2 diabetes mellitus with unspecified complications: Secondary | ICD-10-CM

## 2017-05-01 DIAGNOSIS — R3129 Other microscopic hematuria: Secondary | ICD-10-CM

## 2017-05-01 DIAGNOSIS — Z23 Encounter for immunization: Secondary | ICD-10-CM

## 2017-05-01 DIAGNOSIS — E785 Hyperlipidemia, unspecified: Secondary | ICD-10-CM

## 2017-05-01 DIAGNOSIS — I10 Essential (primary) hypertension: Secondary | ICD-10-CM

## 2017-05-01 LAB — BASIC METABOLIC PANEL
BUN: 17 mg/dL (ref 6–23)
CHLORIDE: 107 meq/L (ref 96–112)
CO2: 26 mEq/L (ref 19–32)
CREATININE: 1.42 mg/dL (ref 0.40–1.50)
Calcium: 9.2 mg/dL (ref 8.4–10.5)
GFR: 63.03 mL/min (ref >60.00)
Glucose, Bld: 134 mg/dL — ABNORMAL HIGH (ref 70–99)
Potassium: 4.3 mEq/L (ref 3.5–5.1)
Sodium: 139 mEq/L (ref 135–145)

## 2017-05-01 LAB — URINALYSIS, ROUTINE W REFLEX MICROSCOPIC
BILIRUBIN URINE: NEGATIVE
Hgb urine dipstick: NEGATIVE
Ketones, ur: NEGATIVE
LEUKOCYTES UA: NEGATIVE
NITRITE: NEGATIVE
Specific Gravity, Urine: 1.025 (ref 1.000–1.030)
TOTAL PROTEIN, URINE-UPE24: NEGATIVE
UROBILINOGEN UA: 0.2 (ref 0.0–1.0)
Urine Glucose: NEGATIVE
pH: 5.5 (ref 5.0–8.0)

## 2017-05-01 LAB — LIPID PANEL
CHOL/HDL RATIO: 3
Cholesterol: 168 mg/dL (ref 0–200)
HDL: 49.9 mg/dL (ref >39.00)
LDL Cholesterol: 98 mg/dL (ref 0–99)
NONHDL: 117.92
Triglycerides: 100 mg/dL (ref 0.0–149.0)
VLDL: 20 mg/dL (ref 0.0–40.0)

## 2017-05-01 LAB — HEMOGLOBIN A1C: HEMOGLOBIN A1C: 7.2 % — AB (ref 4.6–6.5)

## 2017-05-01 MED ORDER — FLUTICASONE FUROATE-VILANTEROL 100-25 MCG/INH IN AEPB: 1.0000 | INHALATION_SPRAY | Freq: Every morning | RESPIRATORY_TRACT | 11 refills | Status: DC

## 2017-05-01 NOTE — Patient Instructions (Signed)

## 2017-05-01 NOTE — Progress Notes (Signed)
Subjective:  Patient ID: Jesse Garrison, male    DOB: 1946/05/02  Age: 71 y.o. MRN: [id number]  CC: COPD; Hypertension; and Diabetes   HPI Jesse Garrison presents for f/up - He has had no recent troubles with his breathing. He has his baseline level of shortness of breath and he tells me the symptoms are well controlled with Breo. He has had no recent episodes of cough or chest pain. He wants to know if the Trichomonas infection has been eradicated.  Outpatient Medications Prior to Visit  Medication Sig Dispense Refill  . albuterol (PROVENTIL HFA;VENTOLIN HFA) 108 (90 BASE) MCG/ACT inhaler Inhale 2 puffs into the lungs every 6 (six) hours as needed.    Marland Kitchen amLODipine (NORVASC) 5 MG tablet Take 1 tablet (5 mg total) by mouth daily. 90 tablet 1  . losartan (COZAAR) 100 MG tablet Take 1 tablet (100 mg total) by mouth daily. 90 tablet 1  . metFORMIN (GLUCOPHAGE-XR) 750 MG 24 hr tablet Take 2 tablets (1,500 mg total) by mouth daily with breakfast. 180 tablet 1  . Omega-3 Krill Oil 1000 MG CAPS Take 1 tablet by mouth daily.    . pravastatin (PRAVACHOL) 40 MG tablet Take 1 tablet (40 mg total) by mouth daily. 30 tablet 11  . terazosin (HYTRIN) 5 MG capsule Take 5 mg by mouth at bedtime.      . fluticasone furoate-vilanterol (BREO ELLIPTA) 100-25 MCG/INH AEPB Inhale 1 puff into the lungs every morning. 28 each 11  . nebivolol (BYSTOLIC) 5 MG tablet Take 1 tablet (5 mg total) by mouth daily. 30 tablet 11   No facility-administered medications prior to visit.     ROS Review of Systems  Constitutional: Negative.  Negative for appetite change, chills, fatigue and fever.  HENT: Negative.  Negative for sore throat and trouble swallowing.   Eyes: Negative.   Respiratory: Positive for shortness of breath. Negative for cough, chest tightness and wheezing.   Cardiovascular: Negative for chest pain, palpitations and leg swelling.  Gastrointestinal: Negative for abdominal pain, constipation, diarrhea, nausea  and vomiting.  Endocrine: Negative.  Negative for polyuria.  Genitourinary: Negative.  Negative for difficulty urinating, discharge, frequency, hematuria, penile pain, scrotal swelling, testicular pain and urgency.  Musculoskeletal: Negative.  Negative for myalgias.  Skin: Negative.  Negative for color change.  Allergic/Immunologic: Negative.   Neurological: Negative.  Negative for dizziness.  Hematological: Negative for adenopathy. Does not bruise/bleed easily.  Psychiatric/Behavioral: Negative.     Objective:  BP (!) 146/72 (BP Location: Left Arm, Patient Position: Sitting, Cuff Size: Large)   Pulse 82   Temp 98.9 F (37.2 C) (Oral)   Resp 18   Ht 5\' 10"  (1.778 m)   Wt 253 lb (114.8 kg)   SpO2 91%   BMI 36.30 kg/m   BP Readings from Last 3 Encounters:  05/01/17 (!) 146/72  12/31/16 136/74  10/18/16 140/72    Wt Readings from Last 3 Encounters:  05/01/17 253 lb (114.8 kg)  12/31/16 257 lb 8 oz (116.8 kg)  10/18/16 258 lb (117 kg)    Physical Exam  Constitutional: He is oriented to person, place, and time. No distress.  HENT:  Mouth/Throat: Oropharynx is clear and moist. No oropharyngeal exudate.  Eyes: Conjunctivae are normal. Right eye exhibits no discharge. Left eye exhibits no discharge. No scleral icterus.  Neck: Normal range of motion. Neck supple. No JVD present. No thyromegaly present.  Cardiovascular: Normal rate, regular rhythm and intact distal pulses.  Exam reveals no gallop  and no friction rub.   No murmur heard. Pulmonary/Chest: Effort normal. No accessory muscle usage. No tachypnea. He has no decreased breath sounds. He has no wheezes. He has rhonchi in the right middle field, the right lower field, the left middle field and the left lower field. He has no rales. He exhibits no tenderness.  Diffuse, bilateral, lower zone expiratory rhonchi but good air movement  Abdominal: Soft. Bowel sounds are normal. He exhibits no distension and no mass. There is no  tenderness. There is no rebound and no guarding.  Musculoskeletal: Normal range of motion. He exhibits no edema, tenderness or deformity.  Lymphadenopathy:    He has no cervical adenopathy.  Neurological: He is alert and oriented to person, place, and time.  Skin: Skin is warm and dry. No rash noted. He is not diaphoretic. No erythema. No pallor.  Vitals reviewed.   Lab Results  Component Value Date   WBC 10.3 10/17/2016   HGB 13.7 10/17/2016   HCT 42.2 10/17/2016   PLT 193.0 10/17/2016   GLUCOSE 134 (H) 05/01/2017   CHOL 168 05/01/2017   TRIG 100.0 05/01/2017   HDL 49.90 05/01/2017   LDLCALC 98 05/01/2017   ALT 14 01/02/2016   AST 14 01/02/2016   NA 139 05/01/2017   K 4.3 05/01/2017   CL 107 05/01/2017   CREATININE 1.42 05/01/2017   BUN 17 05/01/2017   CO2 26 05/01/2017   TSH 0.59 05/01/2017   PSA 3.39 10/17/2016   HGBA1C 7.2 (H) 05/01/2017   MICROALBUR 0.8 12/31/2016    Ct Chest W Contrast  Result Date: 01/04/2017 CLINICAL DATA:  Follow-up right upper lobe lung mass. History of COPD. No known tissue sampling of the mass . EXAM: CT CHEST WITH CONTRAST TECHNIQUE: Multidetector CT imaging of the chest was performed during intravenous contrast administration. CONTRAST:  20mL ISOVUE-300 IOPAMIDOL (ISOVUE-300) INJECTION 61% COMPARISON:  02/27/2016 chest radiograph. 03/22/2014 PET-CT. 02/18/2014 chest CT. FINDINGS: Cardiovascular: Normal heart size. No significant pericardial fluid/thickening. Left anterior descending coronary atherosclerosis. Atherosclerotic nonaneurysmal thoracic aorta. Normal caliber pulmonary arteries. No central pulmonary emboli. Mediastinum/Nodes: No discrete thyroid nodules. Unremarkable esophagus. No pathologically enlarged axillary, mediastinal or hilar lymph nodes. Lungs/Pleura: No pneumothorax. No pleural effusion. Moderate to severe centrilobular emphysema with mild diffuse bronchial wall thickening. Irregular 3.7 x 2.7 cm right upper lobe focus of  consolidation (series 3/ image 43), decreased from 4.6 x 3.1 cm on 02/18/2014 chest CT. Nodular subpleural 1.4 x 1.3 cm opacity in the medial right upper lobe (series 3/ image 47) is decreased from 1.8 x 1.6 cm on 02/18/2014. Stable solid subpleural 5 mm pulmonary nodule associated with the minor fissure (series 3/ image 72), considered benign. No acute consolidative airspace disease or new significant pulmonary nodules. Upper abdomen: Hypodense 2.4 cm medial splenic focus (series 2/ image 150), stable since 02/18/2014, suggesting a benign lesion. Stable appearance of the adrenal glands with irregular mild bilateral adrenal thickening, without discrete adrenal nodules, suggesting adrenal hyperplasia. Musculoskeletal: No aggressive appearing focal osseous lesions. Moderate thoracic spondylosis. IMPRESSION: 1. Irregular 3.7 cm masslike focus of consolidation in the right upper lobe, decreased from 4.6 cm on 02/18/2014 chest CT. Separate smaller subpleural nodular opacity in the medial right upper lobe, also decreased. These findings are most suggestive of foci of postinfectious/postinflammatory scarring, although continued chest CT surveillance is warranted in 1 year. 2. No acute pulmonary disease. No thoracic adenopathy. 3. Moderate to severe emphysema with mild diffuse bronchial wall thickening, compatible with the provided history of COPD.  4. Aortic atherosclerosis.  One vessel coronary atherosclerosis. Electronically Signed   By: Ilona Sorrel M.D.   On: 01/04/2017 15:01    Assessment & Plan:   Rudolf was seen today for copd, hypertension and diabetes.  Diagnoses and all orders for this visit:  Essential hypertension- his blood pressure is well-controlled, electrolytes and renal function are normal. -     Basic metabolic panel; Future  Hyperlipidemia with target LDL less than 100- he has achieved his LDL goal is doing well on the statin. -     Lipid panel; Future -     Thyroid Panel With TSH;  Future  Type 2 diabetes mellitus with complication, without long-term current use of insulin ([place])- his A1c is down to 7.2%. His blood sugars are adequately well-controlled. -     Basic metabolic panel; Future -     Hemoglobin A1c; Future  Need for influenza vaccination -     Flu vaccine HIGH DOSE PF (Fluzone High dose)  Other microscopic hematuria- his UA is normal now -     Urinalysis, Routine w reflex microscopic; Future  Trichomonas infection- UA today documents that this infection has been eradicated -     Urinalysis, Routine w reflex microscopic; Future  Pulmonary emphysema, unspecified emphysema type ([place])- he is doing well on the LABA/ICS combination inhaler. -     fluticasone furoate-vilanterol (BREO ELLIPTA) 100-25 MCG/INH AEPB; Inhale 1 puff into the lungs every morning.   I have discontinued Mr. Santacroce's nebivolol. I am also having him maintain his terazosin, albuterol, Omega-3 Krill Oil, pravastatin, metFORMIN, amLODipine, losartan, and fluticasone furoate-vilanterol.  Meds ordered this encounter  Medications  . fluticasone furoate-vilanterol (BREO ELLIPTA) 100-25 MCG/INH AEPB    Sig: Inhale 1 puff into the lungs every morning.    Dispense:  28 each    Refill:  11     Follow-up: Return in about 6 months (around 10/29/2017).  Scarlette Calico, MD

## 2017-05-02 ENCOUNTER — Encounter: Payer: Self-pay | Admitting: Internal Medicine

## 2017-05-02 LAB — THYROID PANEL WITH TSH
Free Thyroxine Index: 2.1 (ref 1.4–3.8)
T3 UPTAKE: 29 % (ref 22–35)
T4 TOTAL: 7.3 ug/dL (ref 4.9–10.5)
TSH: 0.59 mIU/L (ref 0.40–4.50)

## 2017-06-29 ENCOUNTER — Other Ambulatory Visit: Payer: Self-pay | Admitting: Internal Medicine

## 2017-06-29 DIAGNOSIS — E118 Type 2 diabetes mellitus with unspecified complications: Secondary | ICD-10-CM

## 2017-06-29 DIAGNOSIS — I10 Essential (primary) hypertension: Secondary | ICD-10-CM

## 2017-08-01 ENCOUNTER — Telehealth: Payer: Self-pay | Admitting: Internal Medicine

## 2017-08-01 NOTE — Telephone Encounter (Signed)
Pt needs an appointment to prescribe (if needed) the correct course of action.

## 2017-08-01 NOTE — Telephone Encounter (Signed)
Left message letting patient know °

## 2017-08-01 NOTE — Telephone Encounter (Signed)
Pt came by the office stating that since the snow, he has been feeling under the weather (runny nose, achy, neck is sore after laying down). Has tried over the counter medications but nothing has helped.  He wanted to know if something could be prescribed to help? I told him he may need an appointment but wanted me to ask Dr Ronnald Ramp first.

## 2017-08-20 IMAGING — CT CT RENAL STONE PROTOCOL
2 of 4 series · 17 of 46 positions shown, 19 images · non-contrast
Comparison: PET-CT 03/22/2014

CLINICAL DATA: Hematuria for 6 days.  No pain.

EXAM:
CT ABDOMEN AND PELVIS WITHOUT CONTRAST
TECHNIQUE: Multidetector CT imaging of the abdomen and pelvis was performed
following the standard protocol without IV contrast.

[Series 2: renal stone 5.0 · axial · 0.88mm/px · z∈[+945,+1365]mm · 14 of 92 slices shown, 16 images]
[im 4/92  soft-tissue]
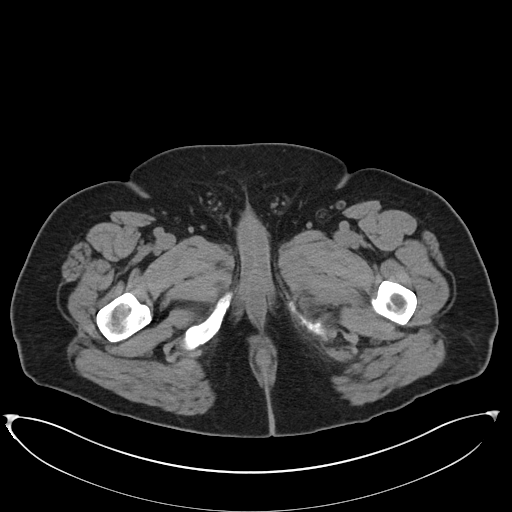
[im 4/92  bone]
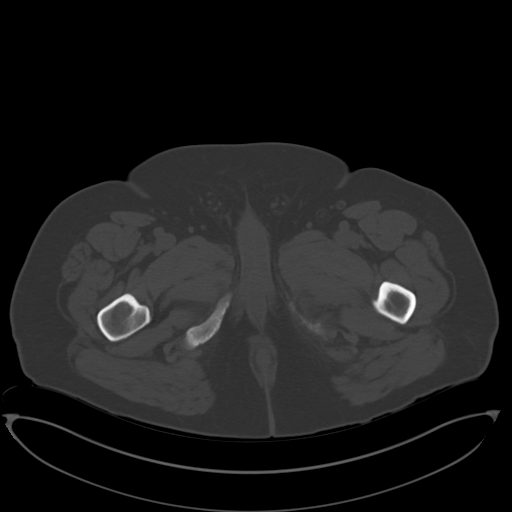
[im 12/92  soft-tissue]
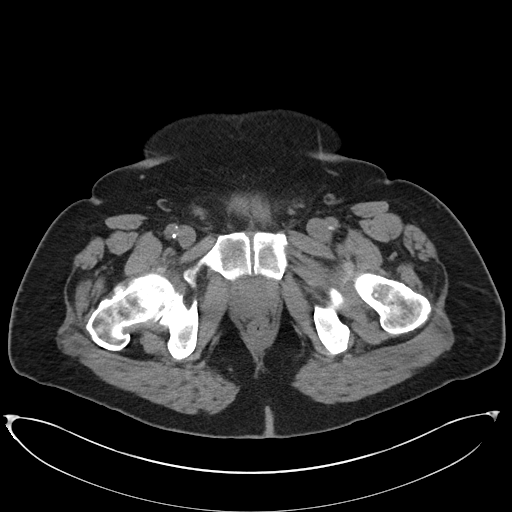
[im 19/92  soft-tissue]
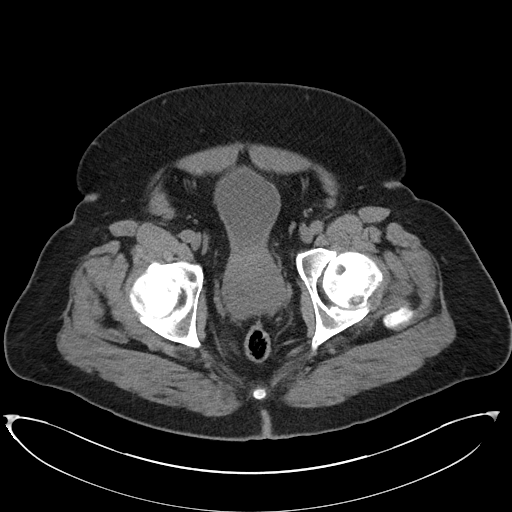
[im 23/92  soft-tissue]
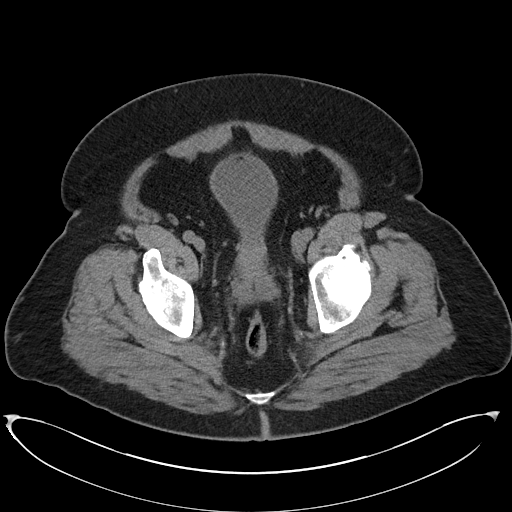
[im 31/92  soft-tissue]
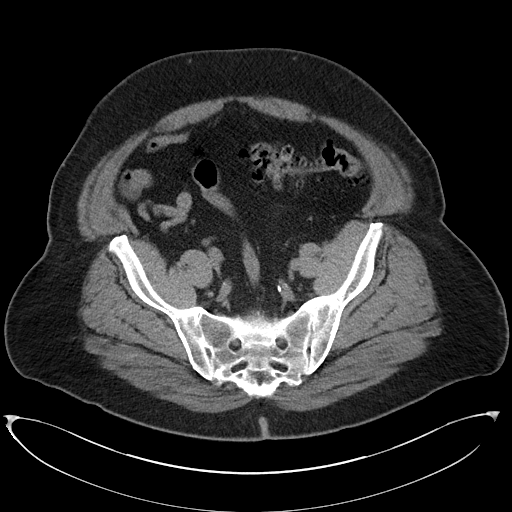
[im 38/92  soft-tissue]
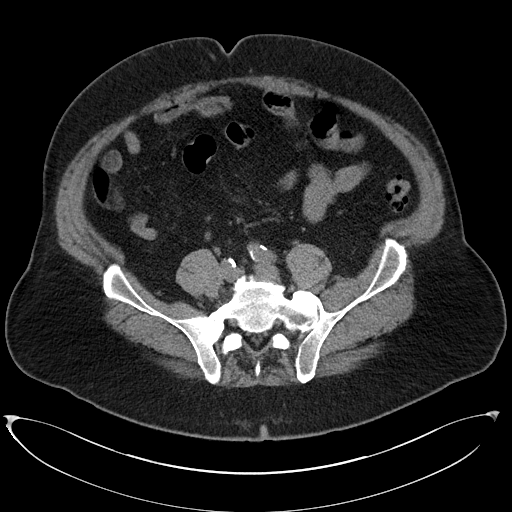
[im 42/92  soft-tissue]
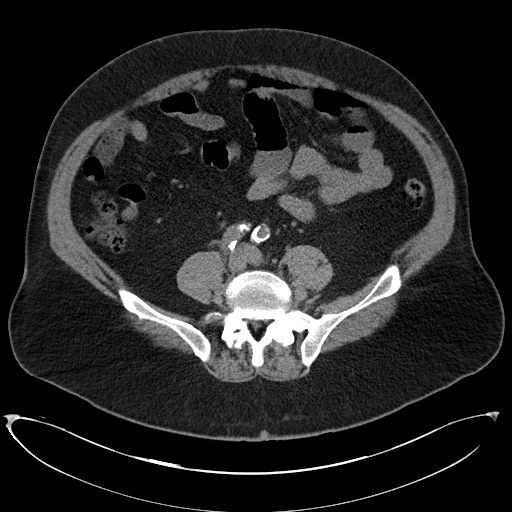
[im 50/92  soft-tissue]
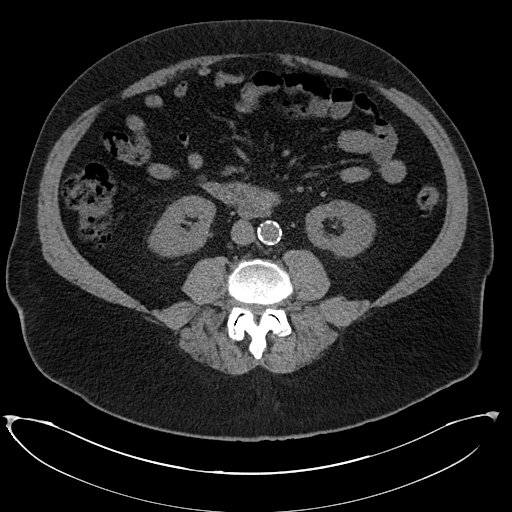
[im 54/92  soft-tissue]
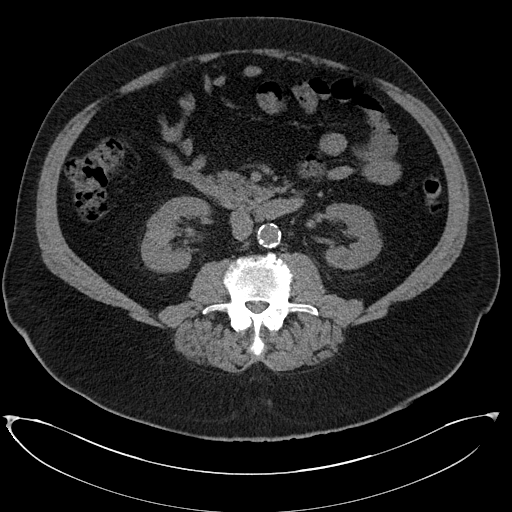
[im 54/92  bone]
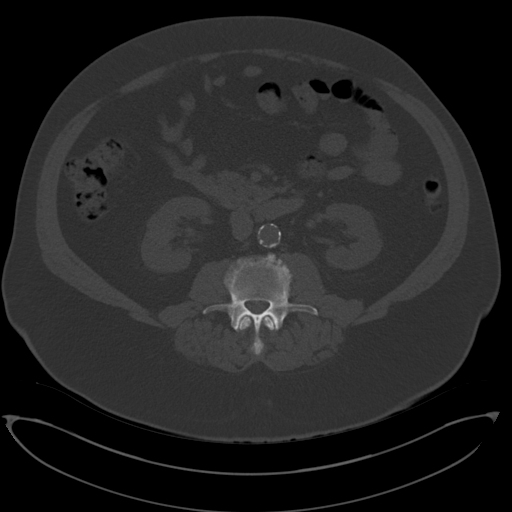
[im 61/92  soft-tissue]
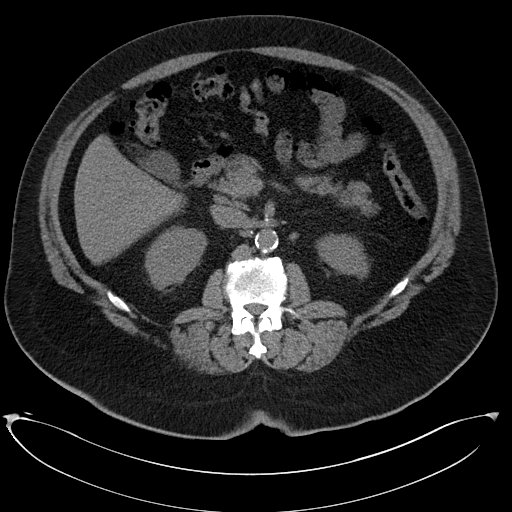
[im 69/92  soft-tissue]
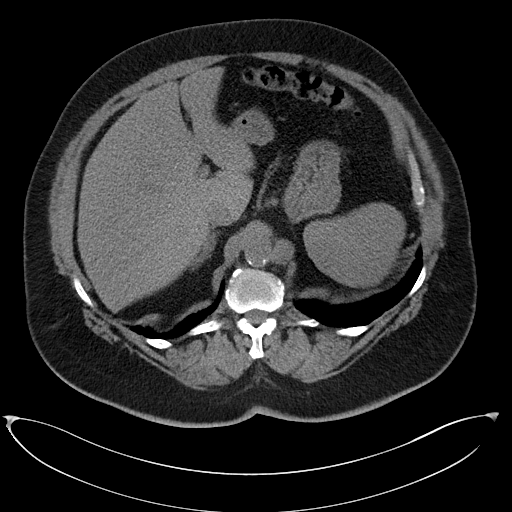
[im 73/92  soft-tissue]
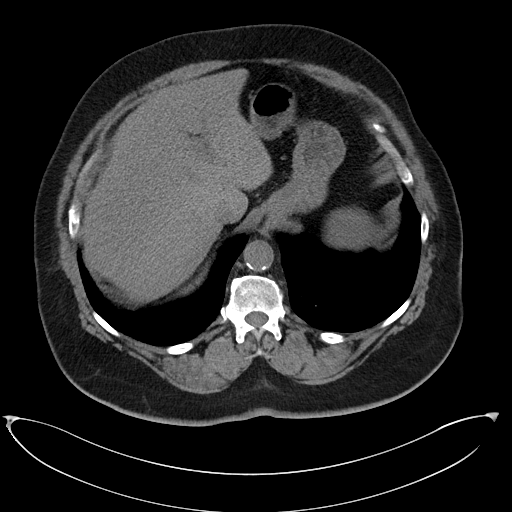
[im 80/92  soft-tissue]
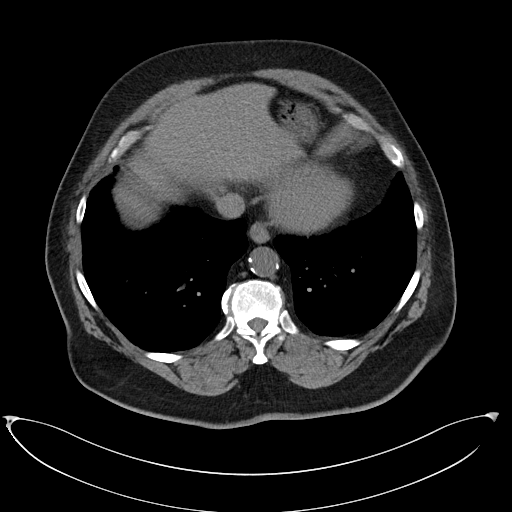
[im 88/92  soft-tissue]
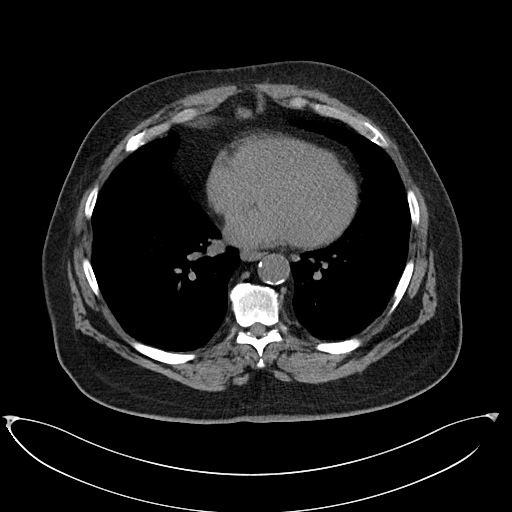

[Series 5: renal stone 3.0 cor · coronal · 0.84mm/px · 3 of 116 slices shown]
[im 39/116  soft-tissue]
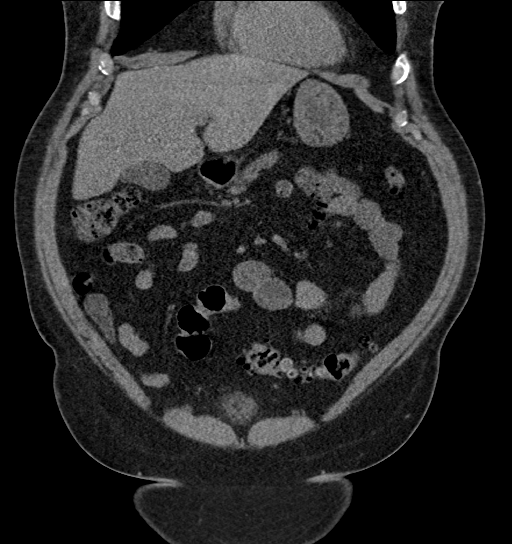
[im 52/116  soft-tissue]
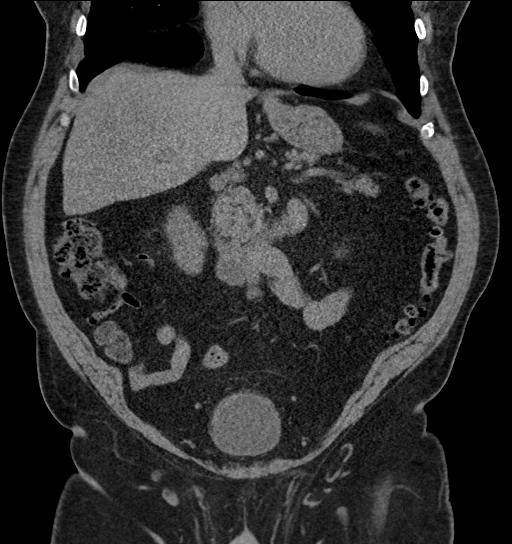
[im 64/116  soft-tissue]
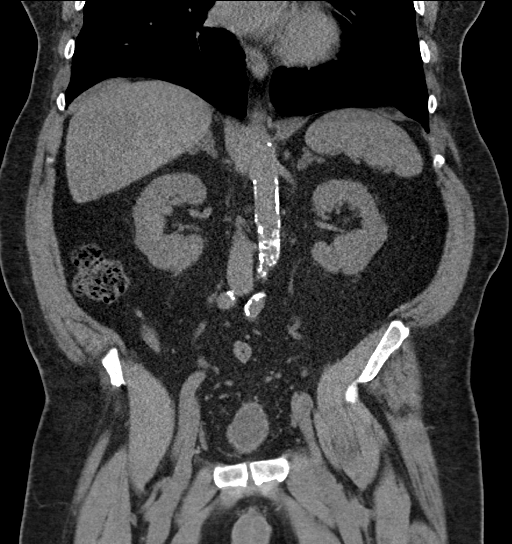

[17 of 46 positions shown; findings below may reference images not displayed]

FINDINGS: Lower chest: Mild bilateral emphysematous changes. No focal
consolidation or pleural effusion.

Hepatobiliary: No focal liver abnormality is seen. No gallstones,
gallbladder wall thickening, or biliary dilatation.

Pancreas: Unremarkable. No pancreatic ductal dilatation or
surrounding inflammatory changes.

Spleen: Normal in size without focal abnormality.

Adrenals/Urinary Tract: Bilateral adrenal hyperplasia. Kidneys are
normal, without renal calculi, focal lesion, or hydronephrosis.
Bladder is unremarkable.

Stomach/Bowel: Stomach is within normal limits. Diverticulosis
without evidence of diverticulitis. Appendix is normal. No evidence
of bowel wall thickening, distention, or inflammatory changes.

Vascular/Lymphatic: Abdominal aortic atherosclerosis. Normal caliber
abdominal aorta. No lymphadenopathy.

Reproductive: Prostatic enlargement.

Other: No fluid collection or hematoma.

Musculoskeletal: No acute osseous abnormality. Ankylosis of
bilateral sacroiliac joints. Bilateral facet arthropathy throughout
the lumbar spine most severe at L4-5.
IMPRESSION: 1. No acute abdominal or pelvic pathology.
2. No urolithiasis or obstructive uropathy.
3. Normal appendix.
4. Diverticulosis without evidence of diverticulitis.
5.  Aortic Atherosclerosis (CXZ5A-170.0)
6.  Emphysema. (CXZ5A-Y7P.3)

## 2017-09-12 ENCOUNTER — Encounter: Payer: Self-pay | Admitting: Internal Medicine

## 2017-09-12 ENCOUNTER — Ambulatory Visit (INDEPENDENT_AMBULATORY_CARE_PROVIDER_SITE_OTHER): Payer: Medicare HMO | Admitting: Internal Medicine

## 2017-09-12 VITALS — BP 140/76 | HR 83 | Temp 97.6°F | Resp 16 | Ht 70.0 in | Wt 255.0 lb

## 2017-09-12 DIAGNOSIS — I10 Essential (primary) hypertension: Secondary | ICD-10-CM | POA: Diagnosis not present

## 2017-09-12 DIAGNOSIS — E118 Type 2 diabetes mellitus with unspecified complications: Secondary | ICD-10-CM

## 2017-09-12 DIAGNOSIS — E785 Hyperlipidemia, unspecified: Secondary | ICD-10-CM | POA: Diagnosis not present

## 2017-09-12 LAB — POCT GLYCOSYLATED HEMOGLOBIN (HGB A1C): HEMOGLOBIN A1C: 6.7

## 2017-09-12 LAB — GLUCOSE, POCT (MANUAL RESULT ENTRY): POC GLUCOSE: 141 mg/dL — AB (ref 70–99)

## 2017-09-12 MED ORDER — AMLODIPINE BESYLATE 5 MG PO TABS: 5.0000 mg | ORAL_TABLET | Freq: Every day | ORAL | 1 refills | Status: DC

## 2017-09-12 MED ORDER — PRAVASTATIN SODIUM 40 MG PO TABS: 40.0000 mg | ORAL_TABLET | Freq: Every day | ORAL | 1 refills | Status: DC

## 2017-09-12 MED ORDER — LOSARTAN POTASSIUM 100 MG PO TABS: 100.0000 mg | ORAL_TABLET | Freq: Every day | ORAL | 1 refills | Status: DC

## 2017-09-12 NOTE — Progress Notes (Signed)
Subjective:  Patient ID: Jesse Garrison, male    DOB: 1946/01/22  Age: 72 y.o. MRN: [id number]  CC: Hypertension; Hyperlipidemia; and Diabetes   HPI Kentarius Partington presents for f/up - He complains that his blood pressure is not very well controlled but he also does not think he has been taking amlodipine or losartan.  He does not know why.  He feels at his baseline today with no recent episodes of cough, wheezing, or shortness of breath.  He denies headache, blurred vision, chest pain, edema, or fatigue.  Outpatient Medications Prior to Visit  Medication Sig Dispense Refill  . fluticasone furoate-vilanterol (BREO ELLIPTA) 100-25 MCG/INH AEPB Inhale 1 puff into the lungs every morning. 28 each 11  . metFORMIN (GLUCOPHAGE-XR) 750 MG 24 hr tablet Take 2 tablets (1,500 mg total) by mouth daily with breakfast. 180 tablet 1  . albuterol (PROVENTIL HFA;VENTOLIN HFA) 108 (90 BASE) MCG/ACT inhaler Inhale 2 puffs into the lungs every 6 (six) hours as needed.    . terazosin (HYTRIN) 5 MG capsule Take 5 mg by mouth at bedtime.      Marland Kitchen amLODipine (NORVASC) 5 MG tablet TAKE 1 TABLET (5 MG TOTAL) BY MOUTH DAILY. (Patient not taking: Reported on 09/12/2017) 90 tablet 1  . losartan (COZAAR) 100 MG tablet TAKE 1 TABLET (100 MG TOTAL) BY MOUTH DAILY. (Patient not taking: Reported on 09/12/2017) 90 tablet 1  . Omega-3 [employer] 1000 MG CAPS Take 1 tablet by mouth daily.    . pravastatin (PRAVACHOL) 40 MG tablet Take 1 tablet (40 mg total) by mouth daily. (Patient not taking: Reported on 09/12/2017) 30 tablet 11   No facility-administered medications prior to visit.     ROS Review of Systems  Constitutional: Negative.  Negative for diaphoresis, fatigue and unexpected weight change.  HENT: Negative.   Eyes: Negative for visual disturbance.  Respiratory: Negative for cough, chest tightness, shortness of breath and wheezing.   Cardiovascular: Negative for chest pain and palpitations.  Gastrointestinal: Negative for  abdominal pain, constipation, diarrhea, nausea and vomiting.  Endocrine: Negative for polydipsia, polyphagia and polyuria.  Genitourinary: Negative.  Negative for decreased urine volume, difficulty urinating and urgency.  Musculoskeletal: Negative.  Negative for back pain, myalgias and neck pain.  Skin: Negative.   Neurological: Negative.  Negative for dizziness and weakness.  Hematological: Negative for adenopathy. Does not bruise/bleed easily.  Psychiatric/Behavioral: Negative.     Objective:  BP 140/76 (BP Location: Left Arm, Patient Position: Sitting, Cuff Size: Large)   Pulse 83   Temp 97.6 F (36.4 C) (Oral)   Resp 16   Ht 5\' 10"  (1.778 m)   Wt 255 lb (115.7 kg)   SpO2 90%   BMI 36.59 kg/m   BP Readings from Last 3 Encounters:  09/12/17 140/76  05/01/17 (!) 146/72  12/31/16 136/74    Wt Readings from Last 3 Encounters:  09/12/17 255 lb (115.7 kg)  05/01/17 253 lb (114.8 kg)  12/31/16 257 lb 8 oz (116.8 kg)    Physical Exam  Constitutional: He is oriented to person, place, and time. No distress.  HENT:  Mouth/Throat: Oropharynx is clear and moist. No oropharyngeal exudate.  Eyes: Conjunctivae are normal. Left eye exhibits no discharge. No scleral icterus.  Neck: Normal range of motion. Neck supple. No JVD present. No thyromegaly present.  Cardiovascular: Normal rate, regular rhythm and normal heart sounds. Exam reveals no gallop and no friction rub.  No murmur heard. Pulmonary/Chest: Effort normal. No respiratory distress. He has  no wheezes. He has no rales.  Abdominal: Soft. Bowel sounds are normal. He exhibits no distension and no mass. There is no tenderness. There is no guarding.  Musculoskeletal: Normal range of motion. He exhibits no edema, tenderness or deformity.  Lymphadenopathy:    He has no cervical adenopathy.  Neurological: He is alert and oriented to person, place, and time.  Skin: Skin is warm and dry. No rash noted. He is not diaphoretic. No  erythema. No pallor.  Vitals reviewed.   Lab Results  Component Value Date   WBC 10.3 10/17/2016   HGB 13.7 10/17/2016   HCT 42.2 10/17/2016   PLT 193.0 10/17/2016   GLUCOSE 134 (H) 05/01/2017   CHOL 168 05/01/2017   TRIG 100.0 05/01/2017   HDL 49.90 05/01/2017   LDLCALC 98 05/01/2017   ALT 14 01/02/2016   AST 14 01/02/2016   NA 139 05/01/2017   K 4.3 05/01/2017   CL 107 05/01/2017   CREATININE 1.42 05/01/2017   BUN 17 05/01/2017   CO2 26 05/01/2017   TSH 0.59 05/01/2017   PSA 3.39 10/17/2016   HGBA1C 6.7 09/12/2017   MICROALBUR 0.8 12/31/2016    Ct Chest W Contrast  Result Date: 01/04/2017 CLINICAL DATA:  Follow-up right upper lobe lung mass. History of COPD. No known tissue sampling of the mass . EXAM: CT CHEST WITH CONTRAST TECHNIQUE: Multidetector CT imaging of the chest was performed during intravenous contrast administration. CONTRAST:  72mL ISOVUE-300 IOPAMIDOL (ISOVUE-300) INJECTION 61% COMPARISON:  02/27/2016 chest radiograph. 03/22/2014 PET-CT. 02/18/2014 chest CT. FINDINGS: Cardiovascular: Normal heart size. No significant pericardial fluid/thickening. Left anterior descending coronary atherosclerosis. Atherosclerotic nonaneurysmal thoracic aorta. Normal caliber pulmonary arteries. No central pulmonary emboli. Mediastinum/Nodes: No discrete thyroid nodules. Unremarkable esophagus. No pathologically enlarged axillary, mediastinal or hilar lymph nodes. Lungs/Pleura: No pneumothorax. No pleural effusion. Moderate to severe centrilobular emphysema with mild diffuse bronchial wall thickening. Irregular 3.7 x 2.7 cm right upper lobe focus of consolidation (series 3/ image 43), decreased from 4.6 x 3.1 cm on 02/18/2014 chest CT. Nodular subpleural 1.4 x 1.3 cm opacity in the medial right upper lobe (series 3/ image 47) is decreased from 1.8 x 1.6 cm on 02/18/2014. Stable solid subpleural 5 mm pulmonary nodule associated with the minor fissure (series 3/ image 72), considered  benign. No acute consolidative airspace disease or new significant pulmonary nodules. Upper abdomen: Hypodense 2.4 cm medial splenic focus (series 2/ image 150), stable since 02/18/2014, suggesting a benign lesion. Stable appearance of the adrenal glands with irregular mild bilateral adrenal thickening, without discrete adrenal nodules, suggesting adrenal hyperplasia. Musculoskeletal: No aggressive appearing focal osseous lesions. Moderate thoracic spondylosis. IMPRESSION: 1. Irregular 3.7 cm masslike focus of consolidation in the right upper lobe, decreased from 4.6 cm on 02/18/2014 chest CT. Separate smaller subpleural nodular opacity in the medial right upper lobe, also decreased. These findings are most suggestive of foci of postinfectious/postinflammatory scarring, although continued chest CT surveillance is warranted in 1 year. 2. No acute pulmonary disease. No thoracic adenopathy. 3. Moderate to severe emphysema with mild diffuse bronchial wall thickening, compatible with the provided history of COPD. 4. Aortic atherosclerosis.  One vessel coronary atherosclerosis. Electronically Signed   By: Ilona Sorrel M.D.   On: 01/04/2017 15:01    Assessment & Plan:   Lukas was seen today for hypertension, hyperlipidemia and diabetes.  Diagnoses and all orders for this visit:  Essential hypertension- His blood pressure is not adequately well controlled.  He agrees to restart the ARB and  CCB. -     losartan (COZAAR) 100 MG tablet; Take 1 tablet (100 mg total) by mouth daily. -     amLODipine (NORVASC) 5 MG tablet; Take 1 tablet (5 mg total) by mouth daily.  Type 2 diabetes mellitus with complication, without long-term current use of insulin ([place])- His A1c is down to 6.7%.  His blood sugars are adequately well controlled.  Will continue metformin at the current dose. -     pravastatin (PRAVACHOL) 40 MG tablet; Take 1 tablet (40 mg total) by mouth daily. -     losartan (COZAAR) 100 MG tablet; Take 1 tablet  (100 mg total) by mouth daily. -     POCT glucose (manual entry) -     POCT glycosylated hemoglobin (Hb A1C)  Hyperlipidemia with target LDL less than 100- He has achieved his LDL goal is doing well on the statin. -     pravastatin (PRAVACHOL) 40 MG tablet; Take 1 tablet (40 mg total) by mouth daily.  Severe obesity with body mass index (BMI) of 35.0 to 39.9 with comorbidity ([place])- he agrees to work on his lifestyle modifications to lose weight.   I have discontinued Pete Fann's Omega-3 [employer]. I am also having him maintain his terazosin, albuterol, metFORMIN, fluticasone furoate-vilanterol, pravastatin, losartan, and amLODipine.  Meds ordered this encounter  Medications  . pravastatin (PRAVACHOL) 40 MG tablet    Sig: Take 1 tablet (40 mg total) by mouth daily.    Dispense:  90 tablet    Refill:  1  . losartan (COZAAR) 100 MG tablet    Sig: Take 1 tablet (100 mg total) by mouth daily.    Dispense:  90 tablet    Refill:  1  . amLODipine (NORVASC) 5 MG tablet    Sig: Take 1 tablet (5 mg total) by mouth daily.    Dispense:  90 tablet    Refill:  1     Follow-up: Return in about 6 months (around 03/12/2018).  Scarlette Calico, MD

## 2017-09-12 NOTE — Patient Instructions (Signed)

## 2017-11-06 IMAGING — CT CT CHEST W/ CM
2 of 3 series · 15 of 36 positions shown, 18 images · IV contrast (ISOVUE 300)
Comparison: 02/27/2016 chest radiograph. 03/22/2014 PET-CT.
02/18/2014 chest CT.

CLINICAL DATA: Follow-up right upper lobe lung mass. History of
COPD. No known tissue sampling of the mass .

EXAM:
CT CHEST WITH CONTRAST
TECHNIQUE: Multidetector CT imaging of the chest was performed during
intravenous contrast administration.
CONTRAST:  80mL PRF2SQ-KSS IOPAMIDOL (PRF2SQ-KSS) INJECTION 61%

[Series 2: thorax · axial · 0.87mm/px · z∈[-360,-58]mm · 12 of 179 slices shown, 15 images]
[im 14/179  mediastinal]
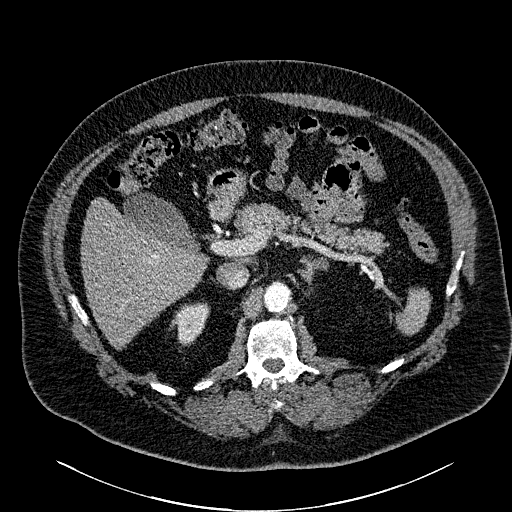
[im 14/179  lung]
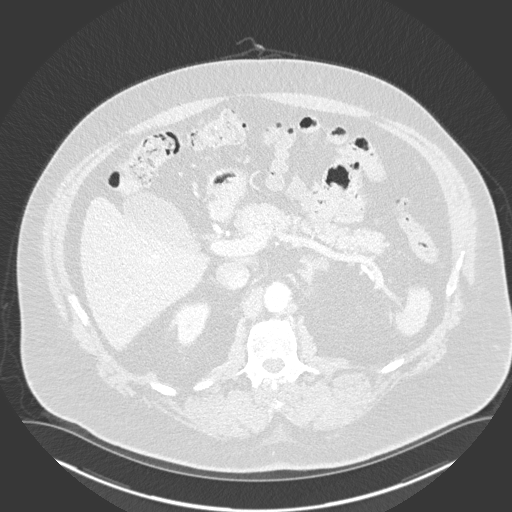
[im 27/179  lung]
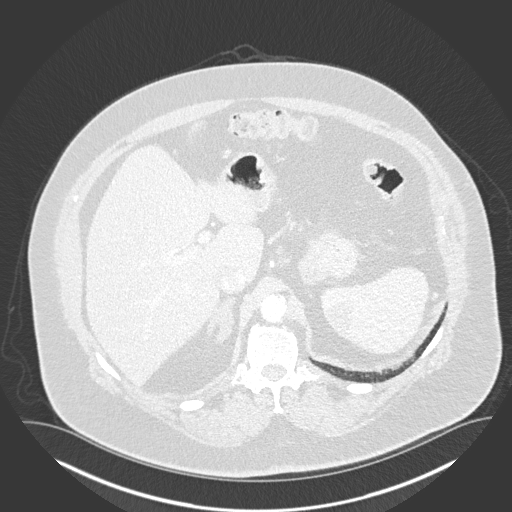
[im 40/179  lung]
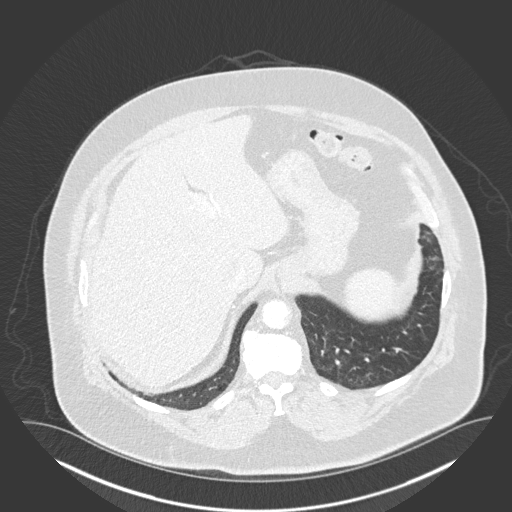
[im 53/179  lung]
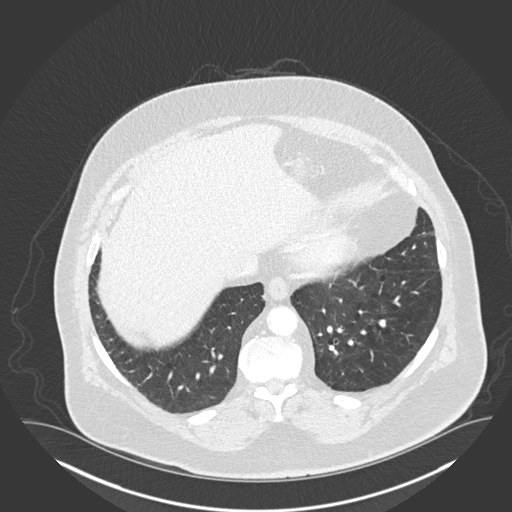
[im 66/179  mediastinal]
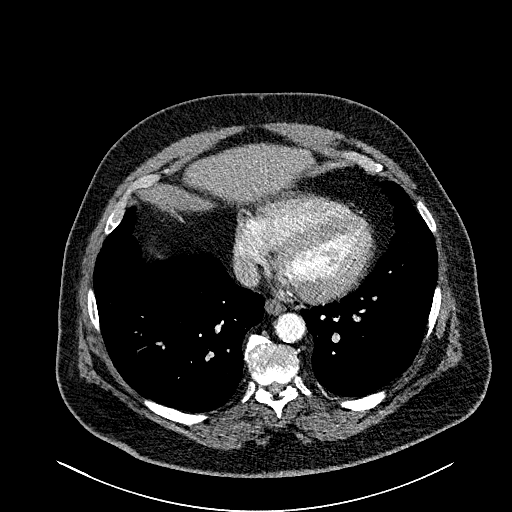
[im 66/179  lung]
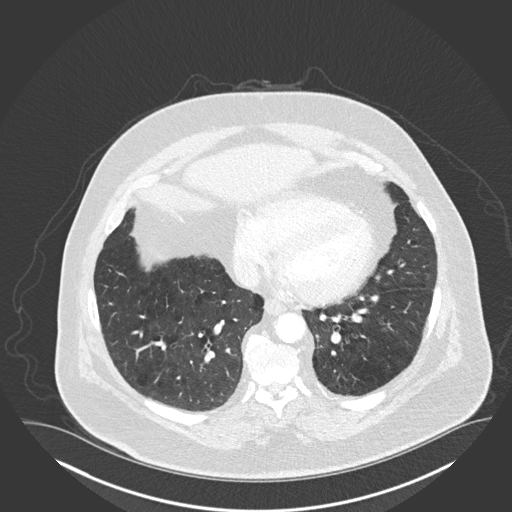
[im 80/179  lung]
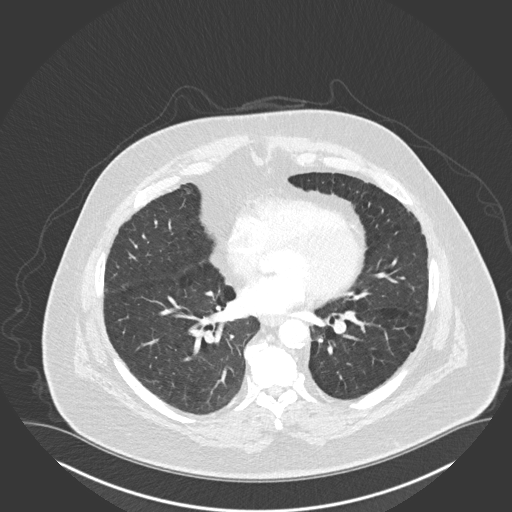
[im 99/179  lung]
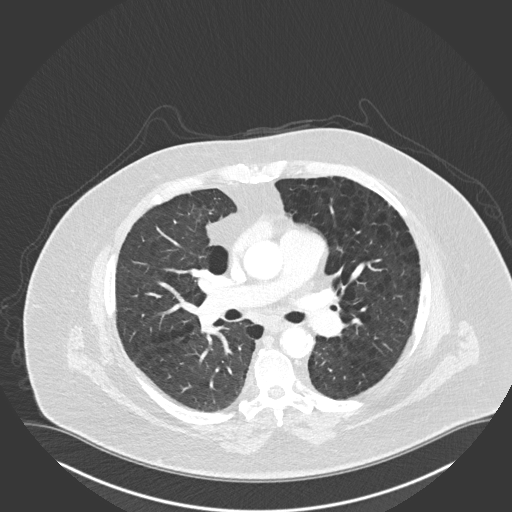
[im 113/179  lung]
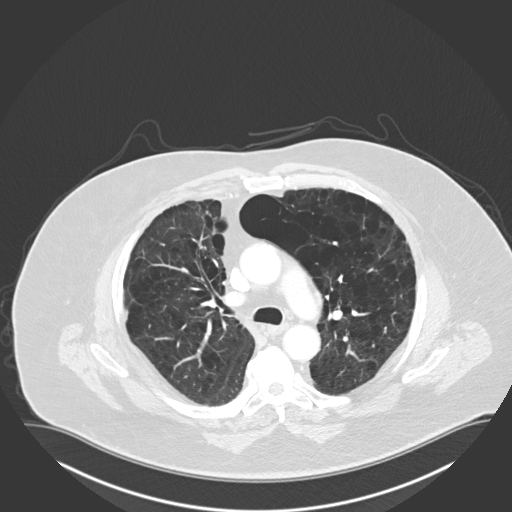
[im 126/179  mediastinal]
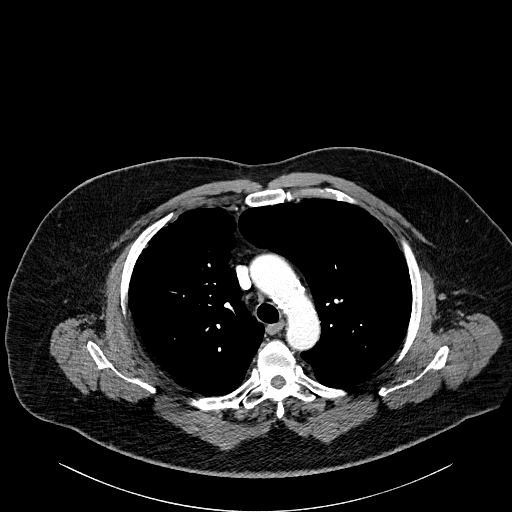
[im 126/179  lung]
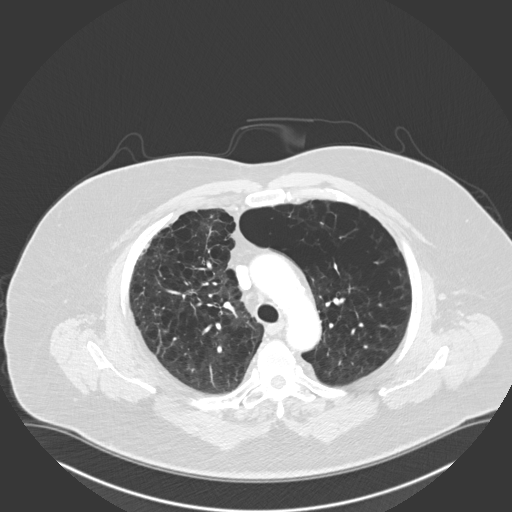
[im 139/179  lung]
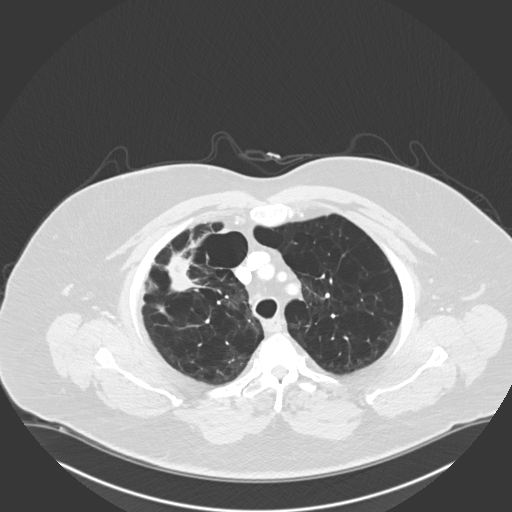
[im 152/179  lung]
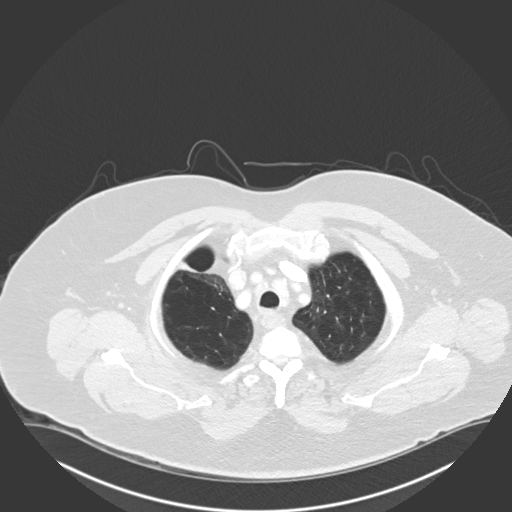
[im 165/179  lung]
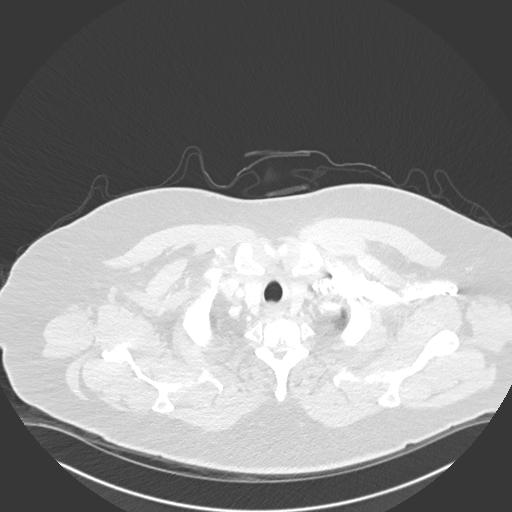

[Series 5: coronal · coronal · 0.70mm/px · 3 of 138 slices shown]
[im 28/138  lung]
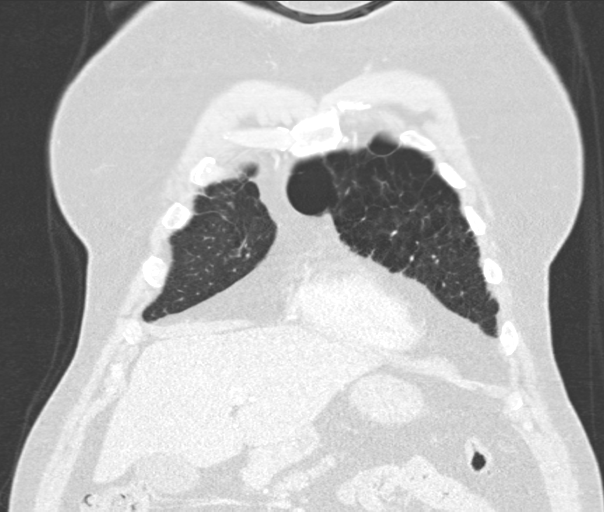
[im 55/138  lung]
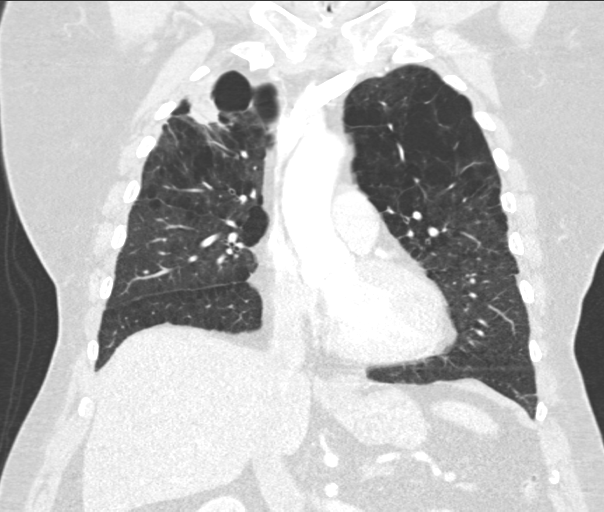
[im 83/138  lung]
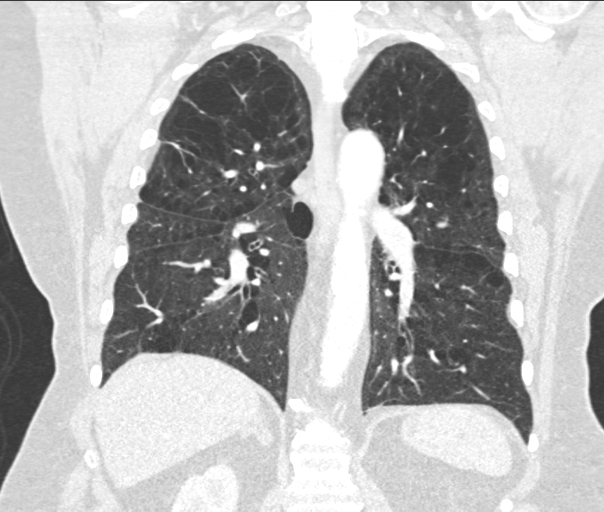

[15 of 36 positions shown; findings below may reference images not displayed]

FINDINGS: Cardiovascular: Normal heart size. No significant pericardial
fluid/thickening. Left anterior descending coronary atherosclerosis.
Atherosclerotic nonaneurysmal thoracic aorta. Normal caliber
pulmonary arteries. No central pulmonary emboli.

Mediastinum/Nodes: No discrete thyroid nodules. Unremarkable
esophagus. No pathologically enlarged axillary, mediastinal or hilar
lymph nodes.

Lungs/Pleura: No pneumothorax. No pleural effusion. Moderate to
severe centrilobular emphysema with mild diffuse bronchial wall
thickening. Irregular 3.7 x 2.7 cm right upper lobe focus of
consolidation (series 3/ image 43), decreased from 4.6 x 3.1 cm on
02/18/2014 chest CT. Nodular subpleural 1.4 x 1.3 cm opacity in the
medial right upper lobe (series 3/ image 47) is decreased from 1.8 x
1.6 cm on 02/18/2014. Stable solid subpleural 5 mm pulmonary nodule
associated with the minor fissure (series 3/ image 72), considered
benign. No acute consolidative airspace disease or new significant
pulmonary nodules.

Upper abdomen: Hypodense 2.4 cm medial splenic focus (series 2/
image 150), stable since 02/18/2014, suggesting a benign lesion.
Stable appearance of the adrenal glands with irregular mild
bilateral adrenal thickening, without discrete adrenal nodules,
suggesting adrenal hyperplasia.

Musculoskeletal: No aggressive appearing focal osseous lesions.
Moderate thoracic spondylosis.
IMPRESSION: 1. Irregular 3.7 cm masslike focus of consolidation in the right
upper lobe, decreased from 4.6 cm on 02/18/2014 chest CT. Separate
smaller subpleural nodular opacity in the medial right upper lobe,
also decreased. These findings are most suggestive of foci of
postinfectious/postinflammatory scarring, although continued chest
CT surveillance is warranted in 1 year.
2. No acute pulmonary disease. No thoracic adenopathy.
3. Moderate to severe emphysema with mild diffuse bronchial wall
thickening, compatible with the provided history of COPD.
4. Aortic atherosclerosis.  One vessel coronary atherosclerosis.

## 2018-01-10 ENCOUNTER — Telehealth: Payer: Self-pay | Admitting: Internal Medicine

## 2018-01-10 DIAGNOSIS — R911 Solitary pulmonary nodule: Secondary | ICD-10-CM

## 2018-01-10 NOTE — Telephone Encounter (Signed)
-----   Message from Janith Lima, MD sent at 01/06/2017 12:03 PM EDT ----- Regarding: lung mass Repeat CT scan

## 2018-01-14 NOTE — Telephone Encounter (Signed)
FYI:  Pt states he was in a research program at [facility] and recently had a CT of chest done. He states he will bring this with him to his appt on 6/12.

## 2018-01-22 ENCOUNTER — Other Ambulatory Visit (INDEPENDENT_AMBULATORY_CARE_PROVIDER_SITE_OTHER): Payer: Medicare HMO

## 2018-01-22 ENCOUNTER — Encounter

## 2018-01-22 ENCOUNTER — Ambulatory Visit (INDEPENDENT_AMBULATORY_CARE_PROVIDER_SITE_OTHER): Payer: Medicare HMO | Admitting: Internal Medicine

## 2018-01-22 ENCOUNTER — Encounter: Payer: Self-pay | Admitting: Internal Medicine

## 2018-01-22 VITALS — BP 146/70 | HR 77 | Temp 98.9°F | Resp 16 | Ht 70.0 in | Wt 253.5 lb

## 2018-01-22 DIAGNOSIS — M1000 Idiopathic gout, unspecified site: Secondary | ICD-10-CM | POA: Diagnosis not present

## 2018-01-22 DIAGNOSIS — R972 Elevated prostate specific antigen [PSA]: Secondary | ICD-10-CM

## 2018-01-22 DIAGNOSIS — Z0000 Encounter for general adult medical examination without abnormal findings: Secondary | ICD-10-CM | POA: Diagnosis not present

## 2018-01-22 DIAGNOSIS — E118 Type 2 diabetes mellitus with unspecified complications: Secondary | ICD-10-CM

## 2018-01-22 DIAGNOSIS — R918 Other nonspecific abnormal finding of lung field: Secondary | ICD-10-CM

## 2018-01-22 DIAGNOSIS — E785 Hyperlipidemia, unspecified: Secondary | ICD-10-CM

## 2018-01-22 DIAGNOSIS — N400 Enlarged prostate without lower urinary tract symptoms: Secondary | ICD-10-CM

## 2018-01-22 DIAGNOSIS — I10 Essential (primary) hypertension: Secondary | ICD-10-CM | POA: Diagnosis not present

## 2018-01-22 DIAGNOSIS — J439 Emphysema, unspecified: Secondary | ICD-10-CM

## 2018-01-22 DIAGNOSIS — E559 Vitamin D deficiency, unspecified: Secondary | ICD-10-CM | POA: Diagnosis not present

## 2018-01-22 DIAGNOSIS — Z86010 Personal history of colonic polyps: Secondary | ICD-10-CM

## 2018-01-22 LAB — CBC WITH DIFFERENTIAL/PLATELET
BASOS ABS: 0 10*3/uL (ref 0.0–0.1)
Basophils Relative: 0.5 % (ref 0.0–3.0)
EOS PCT: 3.4 % (ref 0.0–5.0)
Eosinophils Absolute: 0.3 10*3/uL (ref 0.0–0.7)
HCT: 42.2 % (ref 39.0–52.0)
HEMOGLOBIN: 13.8 g/dL (ref 13.0–17.0)
LYMPHS ABS: 1.8 10*3/uL (ref 0.7–4.0)
Lymphocytes Relative: 18.3 % (ref 12.0–46.0)
MCHC: 32.8 g/dL (ref 30.0–36.0)
MCV: 79.3 fl (ref 78.0–100.0)
MONOS PCT: 9 % (ref 3.0–12.0)
Monocytes Absolute: 0.9 10*3/uL (ref 0.1–1.0)
NEUTROS PCT: 68.8 % (ref 43.0–77.0)
Neutro Abs: 6.8 10*3/uL (ref 1.4–7.7)
Platelets: 208 10*3/uL (ref 150.0–400.0)
RBC: 5.32 Mil/uL (ref 4.22–5.81)
RDW: 15.8 % — ABNORMAL HIGH (ref 11.5–15.5)
WBC: 9.8 10*3/uL (ref 4.0–10.5)

## 2018-01-22 LAB — LIPID PANEL
CHOL/HDL RATIO: 4
Cholesterol: 155 mg/dL (ref 0–200)
HDL: 36.3 mg/dL — ABNORMAL LOW (ref >39.00)
LDL CALC: 101 mg/dL — AB (ref 0–99)
NONHDL: 118.3
Triglycerides: 85 mg/dL (ref 0.0–149.0)
VLDL: 17 mg/dL (ref 0.0–40.0)

## 2018-01-22 LAB — URINALYSIS, ROUTINE W REFLEX MICROSCOPIC
BILIRUBIN URINE: NEGATIVE
Hgb urine dipstick: NEGATIVE
KETONES UR: NEGATIVE
Nitrite: NEGATIVE
RBC / HPF: NONE SEEN (ref 0–?)
Specific Gravity, Urine: 1.02 (ref 1.000–1.030)
TOTAL PROTEIN, URINE-UPE24: NEGATIVE
URINE GLUCOSE: NEGATIVE
UROBILINOGEN UA: 0.2 (ref 0.0–1.0)
pH: 5.5 (ref 5.0–8.0)

## 2018-01-22 LAB — POCT GLYCOSYLATED HEMOGLOBIN (HGB A1C): HEMOGLOBIN A1C: 7.1 % — AB (ref 4.0–5.6)

## 2018-01-22 LAB — MICROALBUMIN / CREATININE URINE RATIO
CREATININE, U: 110.8 mg/dL
MICROALB UR: 1.7 mg/dL (ref 0.0–1.9)
Microalb Creat Ratio: 1.5 mg/g (ref 0.0–30.0)

## 2018-01-22 LAB — TSH: TSH: 1.01 u[IU]/mL (ref 0.35–4.50)

## 2018-01-22 LAB — PSA: PSA: 5.26 ng/mL — ABNORMAL HIGH (ref 0.10–4.00)

## 2018-01-22 LAB — VITAMIN D 25 HYDROXY (VIT D DEFICIENCY, FRACTURES): VITD: 29.95 ng/mL — AB (ref 30.00–100.00)

## 2018-01-22 MED ORDER — DAPAGLIFLOZIN PROPANEDIOL 5 MG PO TABS: 5.0000 mg | ORAL_TABLET | Freq: Every day | ORAL | 1 refills | Status: DC

## 2018-01-22 NOTE — Progress Notes (Signed)
Patient is aware he is due for colonoscopy and eye exam. Pt will sched colonoscopy when he comes back in town in August and pt will call [facility] today.

## 2018-01-22 NOTE — Progress Notes (Signed)
Subjective:  Patient ID: Jesse Garrison, male    DOB: April 08, 1946  Age: 72 y.o. MRN: [id number]  CC: Annual Exam; Hypertension; Diabetes; and Hyperlipidemia   HPI Jesse Garrison presents for a CPX.  He was seen about a month ago at [facility] for a pulmonary study.  His CT scan of the chest showed that the lung lesion is stable.  He has his chronic, stable degree of shortness of breath but has had no recent episodes of cough, wheezing, chest pain, DOE, or hemoptysis.  He is concerned that his blood sugar has not been well controlled.  He denies any recent episodes of polys.  Past Medical History:  Diagnosis Date  . BPH (benign prostatic hyperplasia)   . COPD (chronic obstructive pulmonary disease) ([place])   . Diabetes ([place])   . Hyperlipidemia   . Hypertension   . Osteoarthritis   . Personal history of colonic adenomas 05/08/2013   Past Surgical History:  Procedure Laterality Date  . fatty tissue removal  1996   forehead    reports that he quit smoking about 6 years ago. His smoking use included cigarettes. He has a 50.00 pack-year smoking history. He has never used smokeless tobacco. He reports that he drinks about 0.6 oz of alcohol per week. He reports that he does not use drugs. family history includes Diabetes in his other; Emphysema in his mother. No Known Allergies  Outpatient Medications Prior to Visit  Medication Sig Dispense Refill  . albuterol (PROVENTIL HFA;VENTOLIN HFA) 108 (90 BASE) MCG/ACT inhaler Inhale 2 puffs into the lungs every 6 (six) hours as needed.    Marland Kitchen amLODipine (NORVASC) 5 MG tablet Take 1 tablet (5 mg total) by mouth daily. 90 tablet 1  . fluticasone furoate-vilanterol (BREO ELLIPTA) 100-25 MCG/INH AEPB Inhale 1 puff into the lungs every morning. 28 each 11  . losartan (COZAAR) 100 MG tablet Take 1 tablet (100 mg total) by mouth daily. 90 tablet 1  . metFORMIN (GLUCOPHAGE-XR) 750 MG 24 hr tablet Take 2 tablets (1,500 mg total) by mouth daily with  breakfast. 180 tablet 1  . pravastatin (PRAVACHOL) 40 MG tablet Take 1 tablet (40 mg total) by mouth daily. 90 tablet 1  . terazosin (HYTRIN) 5 MG capsule Take 5 mg by mouth at bedtime.       No facility-administered medications prior to visit.     ROS Review of Systems  Constitutional: Negative.  Negative for appetite change, diaphoresis, fatigue and fever.  HENT: Negative.   Eyes: Negative.  Negative for visual disturbance.  Respiratory: Positive for shortness of breath. Negative for cough, chest tightness, wheezing and stridor.   Cardiovascular: Negative for chest pain, palpitations and leg swelling.  Gastrointestinal: Negative for abdominal pain, constipation, diarrhea, nausea and vomiting.  Endocrine: Negative for polydipsia, polyphagia and polyuria.  Genitourinary: Negative.  Negative for difficulty urinating, dysuria, penile swelling, scrotal swelling, testicular pain and urgency.  Musculoskeletal: Negative.  Negative for arthralgias and myalgias.  Skin: Negative.  Negative for color change, pallor and rash.  Allergic/Immunologic: Negative.   Neurological: Negative.  Negative for dizziness, weakness and headaches.  Hematological: Negative for adenopathy. Does not bruise/bleed easily.  Psychiatric/Behavioral: Negative.     Objective:  BP (!) 146/70 (BP Location: Left Arm, Patient Position: Sitting, Cuff Size: Large)   Pulse 77   Temp 98.9 F (37.2 C) (Oral)   Resp 16   Ht 5\' 10"  (1.778 m)   Wt 253 lb 8 oz (115 kg)   SpO2  93%   BMI 36.37 kg/m   BP Readings from Last 3 Encounters:  01/22/18 (!) 146/70  09/12/17 140/76  05/01/17 (!) 146/72    Wt Readings from Last 3 Encounters:  01/22/18 253 lb 8 oz (115 kg)  09/12/17 255 lb (115.7 kg)  05/01/17 253 lb (114.8 kg)    Physical Exam  Constitutional: He is oriented to person, place, and time. No distress.  HENT:  Mouth/Throat: Oropharynx is clear and moist. No oropharyngeal exudate.  Eyes: Conjunctivae are normal.  No scleral icterus.  Neck: Normal range of motion. Neck supple. No JVD present. No thyromegaly present.  Cardiovascular: Normal rate, regular rhythm and normal heart sounds. Exam reveals no gallop.  No murmur heard. EKG ---  Sinus  Rhythm  WITHIN NORMAL LIMITS  Pulmonary/Chest: Effort normal. Tachypnea noted. No respiratory distress. He has no decreased breath sounds. He has no wheezes. He has rhonchi in the right upper field. He has no rales.  Abdominal: Soft. Normal appearance and bowel sounds are normal. He exhibits no distension and no mass. There is no hepatosplenomegaly. There is no tenderness. No hernia. Hernia confirmed negative in the right inguinal area and confirmed negative in the left inguinal area.  Genitourinary: Testes normal and penis normal. Rectal exam shows no external hemorrhoid, no internal hemorrhoid, no fissure, no mass, no tenderness, anal tone normal and guaiac negative stool. Prostate is enlarged (2+ smooth symm BPH). Prostate is not tender. Right testis shows no mass, no swelling and no tenderness. Left testis shows no mass, no swelling and no tenderness. Circumcised. No penile erythema or penile tenderness. No discharge found.  Musculoskeletal: Normal range of motion. He exhibits no edema or deformity.  Lymphadenopathy:    He has no cervical adenopathy. No inguinal adenopathy noted on the right or left side.  Neurological: He is alert and oriented to person, place, and time.  Skin: Skin is warm and dry. No rash noted. He is not diaphoretic. No erythema. No pallor.  Vitals reviewed.   Lab Results  Component Value Date   WBC 9.8 01/22/2018   HGB 13.8 01/22/2018   HCT 42.2 01/22/2018   PLT 208.0 01/22/2018   GLUCOSE 134 (H) 05/01/2017   CHOL 155 01/22/2018   TRIG 85.0 01/22/2018   HDL 36.30 (L) 01/22/2018   LDLCALC 101 (H) 01/22/2018   ALT 14 01/02/2016   AST 14 01/02/2016   NA 139 05/01/2017   K 4.3 05/01/2017   CL 107 05/01/2017   CREATININE 1.42  05/01/2017   BUN 17 05/01/2017   CO2 26 05/01/2017   TSH 1.01 01/22/2018   PSA 5.26 (H) 01/22/2018   HGBA1C 7.1 (A) 01/22/2018   MICROALBUR 1.7 01/22/2018    Ct Chest W Contrast  Result Date: 01/04/2017 CLINICAL DATA:  Follow-up right upper lobe lung mass. History of COPD. No known tissue sampling of the mass . EXAM: CT CHEST WITH CONTRAST TECHNIQUE: Multidetector CT imaging of the chest was performed during intravenous contrast administration. CONTRAST:  15mL ISOVUE-300 IOPAMIDOL (ISOVUE-300) INJECTION 61% COMPARISON:  02/27/2016 chest radiograph. 03/22/2014 PET-CT. 02/18/2014 chest CT. FINDINGS: Cardiovascular: Normal heart size. No significant pericardial fluid/thickening. Left anterior descending coronary atherosclerosis. Atherosclerotic nonaneurysmal thoracic aorta. Normal caliber pulmonary arteries. No central pulmonary emboli. Mediastinum/Nodes: No discrete thyroid nodules. Unremarkable esophagus. No pathologically enlarged axillary, mediastinal or hilar lymph nodes. Lungs/Pleura: No pneumothorax. No pleural effusion. Moderate to severe centrilobular emphysema with mild diffuse bronchial wall thickening. Irregular 3.7 x 2.7 cm right upper lobe focus of consolidation (series  3/ image 43), decreased from 4.6 x 3.1 cm on 02/18/2014 chest CT. Nodular subpleural 1.4 x 1.3 cm opacity in the medial right upper lobe (series 3/ image 47) is decreased from 1.8 x 1.6 cm on 02/18/2014. Stable solid subpleural 5 mm pulmonary nodule associated with the minor fissure (series 3/ image 72), considered benign. No acute consolidative airspace disease or new significant pulmonary nodules. Upper abdomen: Hypodense 2.4 cm medial splenic focus (series 2/ image 150), stable since 02/18/2014, suggesting a benign lesion. Stable appearance of the adrenal glands with irregular mild bilateral adrenal thickening, without discrete adrenal nodules, suggesting adrenal hyperplasia. Musculoskeletal: No aggressive appearing focal  osseous lesions. Moderate thoracic spondylosis. IMPRESSION: 1. Irregular 3.7 cm masslike focus of consolidation in the right upper lobe, decreased from 4.6 cm on 02/18/2014 chest CT. Separate smaller subpleural nodular opacity in the medial right upper lobe, also decreased. These findings are most suggestive of foci of postinfectious/postinflammatory scarring, although continued chest CT surveillance is warranted in 1 year. 2. No acute pulmonary disease. No thoracic adenopathy. 3. Moderate to severe emphysema with mild diffuse bronchial wall thickening, compatible with the provided history of COPD. 4. Aortic atherosclerosis.  One vessel coronary atherosclerosis. Electronically Signed   By: Ilona Sorrel M.D.   On: 01/04/2017 15:01    Assessment & Plan:   Saba was seen today for annual exam, hypertension, diabetes and hyperlipidemia.  Diagnoses and all orders for this visit:  Hyperlipidemia with target LDL less than 100- He has achieved his LDL goal and is doing well on the statin. -     Lipid panel; Future -     TSH; Future  Essential hypertension- His blood pressure is not adequately well controlled.  EKG is negative for LVH or ischemia.  I will treat the vitamin D deficiency and continue his current regimen. -     CBC with Differential/Platelet; Future -     Urinalysis, Routine w reflex microscopic; Future -     VITAMIN D 25 Hydroxy (Vit-D Deficiency, Fractures); Future -     EKG 12-Lead  Benign prostatic hyperplasia without lower urinary tract symptoms- His PSA is gone up over the last year.  I will monitor him closely for the development of prostate cancer.  His symptoms are well controlled with Hytrin. -     PSA; Future  Type 2 diabetes mellitus with complication, without long-term current use of insulin ([place])- His A1c is up to 7.1%.  His blood sugars are not adequately well controlled.  I have asked him to add an SGLT2 inhibitor to the metformin. -     POCT glycosylated hemoglobin (Hb  A1C) -     Microalbumin / creatinine urine ratio; Future -     Ambulatory referral to Ophthalmology -     dapagliflozin propanediol (FARXIGA) 5 MG TABS tablet; Take 5 mg by mouth daily.  Pulmonary emphysema, unspecified emphysema type ([place])- His symptoms are well controlled with the current LABA/ICS inhaler.  Idiopathic gout, unspecified chronicity, unspecified site-he will treat this with colchicine as needed.  Personal history of colonic adenomas -     Ambulatory referral to Gastroenterology  Mass of lung-this appears stable based on his recent scan.  Routine general medical examination at a health care facility  Vitamin D deficiency -     Cholecalciferol 2000 units TABS; Take 1 tablet (2,000 Units total) by mouth daily.  PSA elevation-I have asked him to return in a few months for me to recheck this.   I am  having Thomes Dinning start on dapagliflozin propanediol, Cholecalciferol, and Colchicine. I am also having him maintain his terazosin, albuterol, metFORMIN, fluticasone furoate-vilanterol, pravastatin, losartan, and amLODipine.  Meds ordered this encounter  Medications  . dapagliflozin propanediol (FARXIGA) 5 MG TABS tablet    Sig: Take 5 mg by mouth daily.    Dispense:  90 tablet    Refill:  1  . Cholecalciferol 2000 units TABS    Sig: Take 1 tablet (2,000 Units total) by mouth daily.    Dispense:  90 tablet    Refill:  1  . Colchicine (MITIGARE) 0.6 MG CAPS    Sig: Take 1 tablet by mouth 2 (two) times daily.    Dispense:  60 capsule    Refill:  5   See AVS for instructions about healthy living and anticipatory guidance.  Follow-up: Return in about 6 months (around 07/24/2018).  Scarlette Calico, MD

## 2018-01-22 NOTE — Patient Instructions (Signed)

## 2018-01-23 DIAGNOSIS — R972 Elevated prostate specific antigen [PSA]: Secondary | ICD-10-CM | POA: Insufficient documentation

## 2018-01-23 DIAGNOSIS — E559 Vitamin D deficiency, unspecified: Secondary | ICD-10-CM | POA: Insufficient documentation

## 2018-01-23 MED ORDER — COLCHICINE 0.6 MG PO CAPS
1.0000 | ORAL_CAPSULE | Freq: Two times a day (BID) | ORAL | 5 refills | Status: DC
Start: 2018-01-23 — End: 2019-09-21

## 2018-01-23 MED ORDER — CHOLECALCIFEROL 50 MCG (2000 UT) PO TABS: 1.0000 | ORAL_TABLET | Freq: Every day | ORAL | 1 refills | Status: DC

## 2018-03-07 ENCOUNTER — Other Ambulatory Visit: Payer: Self-pay | Admitting: Internal Medicine

## 2018-03-07 DIAGNOSIS — E118 Type 2 diabetes mellitus with unspecified complications: Secondary | ICD-10-CM

## 2018-03-07 DIAGNOSIS — E785 Hyperlipidemia, unspecified: Secondary | ICD-10-CM

## 2018-03-19 DIAGNOSIS — E119 Type 2 diabetes mellitus without complications: Secondary | ICD-10-CM | POA: Diagnosis not present

## 2018-03-19 DIAGNOSIS — H524 Presbyopia: Secondary | ICD-10-CM | POA: Diagnosis not present

## 2018-03-19 DIAGNOSIS — H2513 Age-related nuclear cataract, bilateral: Secondary | ICD-10-CM | POA: Diagnosis not present

## 2018-03-19 DIAGNOSIS — H401132 Primary open-angle glaucoma, bilateral, moderate stage: Secondary | ICD-10-CM | POA: Diagnosis not present

## 2018-03-19 LAB — HM DIABETES EYE EXAM

## 2018-03-25 ENCOUNTER — Encounter: Payer: Self-pay | Admitting: Internal Medicine

## 2018-03-31 ENCOUNTER — Other Ambulatory Visit: Payer: Self-pay | Admitting: Internal Medicine

## 2018-03-31 DIAGNOSIS — R972 Elevated prostate specific antigen [PSA]: Secondary | ICD-10-CM

## 2018-04-03 ENCOUNTER — Other Ambulatory Visit: Payer: Medicare HMO

## 2018-04-03 DIAGNOSIS — R972 Elevated prostate specific antigen [PSA]: Secondary | ICD-10-CM | POA: Diagnosis not present

## 2018-04-03 DIAGNOSIS — H401112 Primary open-angle glaucoma, right eye, moderate stage: Secondary | ICD-10-CM | POA: Diagnosis not present

## 2018-04-04 LAB — PSA, TOTAL AND FREE
PSA, % Free: 21 % (calc) — ABNORMAL LOW (ref >25)
PSA, Free: 0.9 ng/mL
PSA, TOTAL: 4.2 ng/mL — AB (ref <=4.0)

## 2018-04-04 LAB — PSA: PSA: 4.2

## 2018-05-07 ENCOUNTER — Encounter: Payer: Self-pay | Admitting: Internal Medicine

## 2018-06-05 ENCOUNTER — Telehealth: Payer: Self-pay | Admitting: Internal Medicine

## 2018-06-05 DIAGNOSIS — M1000 Idiopathic gout, unspecified site: Secondary | ICD-10-CM

## 2018-06-05 NOTE — Telephone Encounter (Signed)
Pt states that he never did pick up the script and they have 60 pills and it is going to be about $200, patient is requesting Stefannie to call him back. Please advise.

## 2018-06-05 NOTE — Telephone Encounter (Signed)
Pt informed that rx for colchicine has refills and the pharmacy can transfer from [facility].

## 2018-06-05 NOTE — Telephone Encounter (Signed)
Copied from [place] [id number]. Topic: General - Other >> Jun 05, 2018  9:06 AM Carolyn Stare wrote:  [place] said [place] and has a gout flare up and is not sure what the name of the medicine that Dr Hadassah Pais him. He is asking if a RX for the gout med can be sent to   [facility]    [address], [place] [address]

## 2018-06-06 NOTE — Telephone Encounter (Signed)
Pt contacted and stated that he was able to pick up a partial rx.

## 2018-06-28 ENCOUNTER — Other Ambulatory Visit: Payer: Self-pay | Admitting: Internal Medicine

## 2018-06-28 DIAGNOSIS — E118 Type 2 diabetes mellitus with unspecified complications: Secondary | ICD-10-CM

## 2018-06-28 DIAGNOSIS — I10 Essential (primary) hypertension: Secondary | ICD-10-CM

## 2018-07-02 ENCOUNTER — Ambulatory Visit (INDEPENDENT_AMBULATORY_CARE_PROVIDER_SITE_OTHER): Payer: Medicare HMO | Admitting: Internal Medicine

## 2018-07-02 ENCOUNTER — Encounter: Payer: Self-pay | Admitting: Internal Medicine

## 2018-07-02 ENCOUNTER — Other Ambulatory Visit (INDEPENDENT_AMBULATORY_CARE_PROVIDER_SITE_OTHER): Payer: Medicare HMO

## 2018-07-02 VITALS — BP 138/74 | HR 83 | Temp 99.0°F | Resp 16 | Ht 70.0 in | Wt 253.5 lb

## 2018-07-02 DIAGNOSIS — B351 Tinea unguium: Secondary | ICD-10-CM

## 2018-07-02 DIAGNOSIS — J431 Panlobular emphysema: Secondary | ICD-10-CM | POA: Diagnosis not present

## 2018-07-02 DIAGNOSIS — Z23 Encounter for immunization: Secondary | ICD-10-CM | POA: Diagnosis not present

## 2018-07-02 DIAGNOSIS — E118 Type 2 diabetes mellitus with unspecified complications: Secondary | ICD-10-CM

## 2018-07-02 DIAGNOSIS — E785 Hyperlipidemia, unspecified: Secondary | ICD-10-CM

## 2018-07-02 DIAGNOSIS — R0609 Other forms of dyspnea: Secondary | ICD-10-CM | POA: Diagnosis not present

## 2018-07-02 DIAGNOSIS — I10 Essential (primary) hypertension: Secondary | ICD-10-CM | POA: Diagnosis not present

## 2018-07-02 LAB — COMPREHENSIVE METABOLIC PANEL
ALT: 17 U/L (ref 0–53)
AST: 17 U/L (ref 0–37)
Albumin: 4.2 g/dL (ref 3.5–5.2)
Alkaline Phosphatase: 57 U/L (ref 39–117)
BUN: 17 mg/dL (ref 6–23)
CHLORIDE: 108 meq/L (ref 96–112)
CO2: 22 meq/L (ref 19–32)
CREATININE: 1.15 mg/dL (ref 0.40–1.50)
Calcium: 9.4 mg/dL (ref 8.4–10.5)
GFR: 80.14 mL/min (ref >60.00)
GLUCOSE: 112 mg/dL — AB (ref 70–99)
Potassium: 4 mEq/L (ref 3.5–5.1)
SODIUM: 140 meq/L (ref 135–145)
Total Bilirubin: 0.4 mg/dL (ref 0.2–1.2)
Total Protein: 7.2 g/dL (ref 6.0–8.3)

## 2018-07-02 LAB — HEMOGLOBIN A1C: HEMOGLOBIN A1C: 7.8 % — AB (ref 4.6–6.5)

## 2018-07-02 MED ORDER — FLUTICASONE-UMECLIDIN-VILANT 100-62.5-25 MCG/INH IN AEPB: 1.0000 | INHALATION_SPRAY | Freq: Every day | RESPIRATORY_TRACT | 1 refills | Status: DC

## 2018-07-02 MED ORDER — METFORMIN HCL ER 750 MG PO TB24: 1500.0000 mg | ORAL_TABLET | Freq: Every day | ORAL | 1 refills | Status: DC

## 2018-07-02 MED ORDER — DAPAGLIFLOZIN PROPANEDIOL 10 MG PO TABS: 10.0000 mg | ORAL_TABLET | Freq: Every day | ORAL | 1 refills | Status: DC

## 2018-07-02 NOTE — Patient Instructions (Signed)

## 2018-07-02 NOTE — Progress Notes (Signed)
Subjective:  Patient ID: Jesse Garrison, male    DOB: 1946-02-04  Age: 72 y.o. MRN: [id number]  CC: Hypertension and Diabetes   HPI Jesse Garrison presents for f/up - He complains of chronic, unchanged DOE and wheezing.  He denies any recent episodes of chest pain, hemoptysis, diaphoresis, lightheadedness, or dizziness.  Outpatient Medications Prior to Visit  Medication Sig Dispense Refill  . albuterol (PROVENTIL HFA;VENTOLIN HFA) 108 (90 BASE) MCG/ACT inhaler Inhale 2 puffs into the lungs every 6 (six) hours as needed.    Marland Kitchen amLODipine (NORVASC) 5 MG tablet TAKE 1 TABLET BY MOUTH EVERY DAY 90 tablet 1  . Cholecalciferol 2000 units TABS Take 1 tablet (2,000 Units total) by mouth daily. 90 tablet 1  . Colchicine (MITIGARE) 0.6 MG CAPS Take 1 tablet by mouth 2 (two) times daily. 60 capsule 5  . losartan (COZAAR) 100 MG tablet TAKE 1 TABLET BY MOUTH EVERY DAY 90 tablet 1  . pravastatin (PRAVACHOL) 40 MG tablet TAKE 1 TABLET BY MOUTH EVERY DAY 90 tablet 1  . terazosin (HYTRIN) 5 MG capsule Take 5 mg by mouth at bedtime.      . dapagliflozin propanediol (FARXIGA) 5 MG TABS tablet Take 5 mg by mouth daily. 90 tablet 1  . fluticasone furoate-vilanterol (BREO ELLIPTA) 100-25 MCG/INH AEPB Inhale 1 puff into the lungs every morning. 28 each 11  . metFORMIN (GLUCOPHAGE-XR) 750 MG 24 hr tablet Take 2 tablets (1,500 mg total) by mouth daily with breakfast. 180 tablet 1   No facility-administered medications prior to visit.     ROS Review of Systems  Constitutional: Negative.  Negative for chills, diaphoresis, fatigue and fever.  HENT: Negative.   Respiratory: Positive for shortness of breath. Negative for cough and chest tightness.   Cardiovascular: Negative for chest pain, palpitations and leg swelling.  Gastrointestinal: Negative for abdominal pain, constipation, diarrhea and nausea.  Endocrine: Negative.   Genitourinary: Negative.  Negative for difficulty urinating.  Musculoskeletal: Negative.   Negative for arthralgias.  Skin: Negative.  Negative for color change.  Neurological: Negative for dizziness, weakness, light-headedness and numbness.  Hematological: Negative for adenopathy. Does not bruise/bleed easily.  Psychiatric/Behavioral: Negative.     Objective:  BP 138/74 (BP Location: Left Arm, Patient Position: Sitting, Cuff Size: Large)   Pulse 83   Temp 99 F (37.2 C) (Oral)   Resp 16   Ht 5\' 10"  (1.778 m)   Wt 253 lb 8 oz (115 kg)   SpO2 92%   BMI 36.37 kg/m   BP Readings from Last 3 Encounters:  07/02/18 138/74  01/22/18 (!) 146/70  09/12/17 140/76    Wt Readings from Last 3 Encounters:  07/02/18 253 lb 8 oz (115 kg)  01/22/18 253 lb 8 oz (115 kg)  09/12/17 255 lb (115.7 kg)    Physical Exam  Constitutional: He is oriented to person, place, and time. No distress.  HENT:  Mouth/Throat: Oropharynx is clear and moist. No oropharyngeal exudate.  Eyes: Conjunctivae are normal. No scleral icterus.  Neck: Normal range of motion. Neck supple. No JVD present. No thyromegaly present.  Cardiovascular: Normal rate, regular rhythm and normal heart sounds.  No murmur heard. EKG ---  Sinus  Rhythm  WITHIN NORMAL LIMITS  Pulmonary/Chest: Effort normal and breath sounds normal. No respiratory distress. He has no wheezes. He has no rhonchi. He has no rales.  Abdominal: Soft. Bowel sounds are normal. He exhibits no mass. There is no hepatosplenomegaly. There is no tenderness.  Musculoskeletal: Normal  range of motion. He exhibits no edema, tenderness or deformity.  Lymphadenopathy:    He has no cervical adenopathy.  Neurological: He is alert and oriented to person, place, and time.  Skin: Skin is warm and dry. No rash noted. He is not diaphoretic.  Vitals reviewed.   Lab Results  Component Value Date   WBC 9.8 01/22/2018   HGB 13.8 01/22/2018   HCT 42.2 01/22/2018   PLT 208.0 01/22/2018   GLUCOSE 112 (H) 07/02/2018   CHOL 155 01/22/2018   TRIG 85.0 01/22/2018     HDL 36.30 (L) 01/22/2018   LDLCALC 101 (H) 01/22/2018   ALT 17 07/02/2018   AST 17 07/02/2018   NA 140 07/02/2018   K 4.0 07/02/2018   CL 108 07/02/2018   CREATININE 1.15 07/02/2018   BUN 17 07/02/2018   CO2 22 07/02/2018   TSH 1.01 01/22/2018   PSA 4.2 04/04/2018   HGBA1C 7.8 (H) 07/02/2018   MICROALBUR 1.7 01/22/2018    Ct Chest W Contrast  Result Date: 01/04/2017 CLINICAL DATA:  Follow-up right upper lobe lung mass. History of COPD. No known tissue sampling of the mass . EXAM: CT CHEST WITH CONTRAST TECHNIQUE: Multidetector CT imaging of the chest was performed during intravenous contrast administration. CONTRAST:  53mL ISOVUE-300 IOPAMIDOL (ISOVUE-300) INJECTION 61% COMPARISON:  02/27/2016 chest radiograph. 03/22/2014 PET-CT. 02/18/2014 chest CT. FINDINGS: Cardiovascular: Normal heart size. No significant pericardial fluid/thickening. Left anterior descending coronary atherosclerosis. Atherosclerotic nonaneurysmal thoracic aorta. Normal caliber pulmonary arteries. No central pulmonary emboli. Mediastinum/Nodes: No discrete thyroid nodules. Unremarkable esophagus. No pathologically enlarged axillary, mediastinal or hilar lymph nodes. Lungs/Pleura: No pneumothorax. No pleural effusion. Moderate to severe centrilobular emphysema with mild diffuse bronchial wall thickening. Irregular 3.7 x 2.7 cm right upper lobe focus of consolidation (series 3/ image 43), decreased from 4.6 x 3.1 cm on 02/18/2014 chest CT. Nodular subpleural 1.4 x 1.3 cm opacity in the medial right upper lobe (series 3/ image 47) is decreased from 1.8 x 1.6 cm on 02/18/2014. Stable solid subpleural 5 mm pulmonary nodule associated with the minor fissure (series 3/ image 72), considered benign. No acute consolidative airspace disease or new significant pulmonary nodules. Upper abdomen: Hypodense 2.4 cm medial splenic focus (series 2/ image 150), stable since 02/18/2014, suggesting a benign lesion. Stable appearance of the  adrenal glands with irregular mild bilateral adrenal thickening, without discrete adrenal nodules, suggesting adrenal hyperplasia. Musculoskeletal: No aggressive appearing focal osseous lesions. Moderate thoracic spondylosis. IMPRESSION: 1. Irregular 3.7 cm masslike focus of consolidation in the right upper lobe, decreased from 4.6 cm on 02/18/2014 chest CT. Separate smaller subpleural nodular opacity in the medial right upper lobe, also decreased. These findings are most suggestive of foci of postinfectious/postinflammatory scarring, although continued chest CT surveillance is warranted in 1 year. 2. No acute pulmonary disease. No thoracic adenopathy. 3. Moderate to severe emphysema with mild diffuse bronchial wall thickening, compatible with the provided history of COPD. 4. Aortic atherosclerosis.  One vessel coronary atherosclerosis. Electronically Signed   By: Ilona Sorrel M.D.   On: 01/04/2017 15:01    Assessment & Plan:   Jesse Garrison was seen today for hypertension and diabetes.  Diagnoses and all orders for this visit:  Essential hypertension- His blood pressure is adequately well controlled.  Electrolytes and renal function are normal. -     Comprehensive metabolic panel; Future  Need for influenza vaccination -     Flu vaccine HIGH DOSE PF (Fluzone High dose)  Diabetes mellitus type 2 with complications (  [place])- His A1c is up to 7.8%.  Based on his prescription refills it looks like he is not compliant with either metformin or the SGLT2 inhibitor.  I have asked him to restart both. -     Comprehensive metabolic panel; Future -     Hemoglobin A1c; Future -     HM Diabetes Foot Exam -     metFORMIN (GLUCOPHAGE-XR) 750 MG 24 hr tablet; Take 2 tablets (1,500 mg total) by mouth daily with breakfast. -     dapagliflozin propanediol (FARXIGA) 10 MG TABS tablet; Take 10 mg by mouth daily.  Panlobular emphysema ([place])- He continues to be symptomatic so I have asked him to upgrade to triple therapy. -      Fluticasone-Umeclidin-Vilant (TRELEGY ELLIPTA) 100-62.5-25 MCG/INH AEPB; Inhale 1 puff into the lungs daily.  Onychomycosis due to dermatophyte- I have asked him to see podiatry to consider treatment options. -     Ambulatory referral to Podiatry  Hyperlipidemia with target LDL less than 100  DOE (dyspnea on exertion)- His EKG is negative for ischemia or LVH.  I think the DOE is related to his lung disease, obesity, and poor conditioning. -     EKG 12-Lead  Type 2 diabetes mellitus with complication, without long-term current use of insulin ([place])   I have discontinued Quillian Quince Haji's fluticasone furoate-vilanterol and dapagliflozin propanediol. I am also having him start on Fluticasone-Umeclidin-Vilant and dapagliflozin propanediol. Additionally, I am having him maintain his terazosin, albuterol, Cholecalciferol, Colchicine, pravastatin, losartan, amLODipine, and metFORMIN.  Meds ordered this encounter  Medications  . Fluticasone-Umeclidin-Vilant (TRELEGY ELLIPTA) 100-62.5-25 MCG/INH AEPB    Sig: Inhale 1 puff into the lungs daily.    Dispense:  90 each    Refill:  1  . metFORMIN (GLUCOPHAGE-XR) 750 MG 24 hr tablet    Sig: Take 2 tablets (1,500 mg total) by mouth daily with breakfast.    Dispense:  180 tablet    Refill:  1  . dapagliflozin propanediol (FARXIGA) 10 MG TABS tablet    Sig: Take 10 mg by mouth daily.    Dispense:  90 tablet    Refill:  1     Follow-up: Return in about 4 months (around 10/31/2018).  Scarlette Calico, MD

## 2018-07-03 DIAGNOSIS — H401132 Primary open-angle glaucoma, bilateral, moderate stage: Secondary | ICD-10-CM | POA: Diagnosis not present

## 2018-08-27 ENCOUNTER — Other Ambulatory Visit: Payer: Self-pay | Admitting: Internal Medicine

## 2018-08-27 DIAGNOSIS — E559 Vitamin D deficiency, unspecified: Secondary | ICD-10-CM

## 2018-09-30 ENCOUNTER — Other Ambulatory Visit: Payer: Self-pay | Admitting: Internal Medicine

## 2018-09-30 ENCOUNTER — Telehealth: Payer: Self-pay

## 2018-09-30 DIAGNOSIS — R972 Elevated prostate specific antigen [PSA]: Secondary | ICD-10-CM

## 2018-09-30 NOTE — Addendum Note (Signed)
Addended by: Karle Barr on: 09/30/2018 05:02 PM   Modules accepted: Orders

## 2018-09-30 NOTE — Telephone Encounter (Signed)
Note from PCP for pt to come in and have PSA rechecked. Pt stated that he is coming into town next week.   PSA orderd and pt will go to the lab.

## 2018-10-02 DIAGNOSIS — H401132 Primary open-angle glaucoma, bilateral, moderate stage: Secondary | ICD-10-CM | POA: Diagnosis not present

## 2018-10-06 ENCOUNTER — Ambulatory Visit: Payer: Medicare HMO | Admitting: Podiatry

## 2018-10-10 ENCOUNTER — Other Ambulatory Visit: Payer: Medicare HMO

## 2018-10-10 DIAGNOSIS — R972 Elevated prostate specific antigen [PSA]: Secondary | ICD-10-CM

## 2018-10-13 LAB — PSA: PSA: 4.9

## 2018-10-13 LAB — PSA, TOTAL AND FREE
PSA, % Free: 24 % (calc) — ABNORMAL LOW (ref >25)
PSA, FREE: 1.2 ng/mL
PSA, Total: 4.9 ng/mL — ABNORMAL HIGH (ref <=4.0)

## 2018-12-30 ENCOUNTER — Other Ambulatory Visit (INDEPENDENT_AMBULATORY_CARE_PROVIDER_SITE_OTHER): Payer: Medicare HMO

## 2018-12-30 ENCOUNTER — Ambulatory Visit (INDEPENDENT_AMBULATORY_CARE_PROVIDER_SITE_OTHER): Payer: Medicare HMO | Admitting: Internal Medicine

## 2018-12-30 ENCOUNTER — Encounter: Payer: Self-pay | Admitting: Internal Medicine

## 2018-12-30 ENCOUNTER — Other Ambulatory Visit: Payer: Self-pay

## 2018-12-30 VITALS — BP 156/68 | HR 87 | Temp 98.1°F | Resp 16 | Ht 70.0 in | Wt 268.0 lb

## 2018-12-30 DIAGNOSIS — E785 Hyperlipidemia, unspecified: Secondary | ICD-10-CM

## 2018-12-30 DIAGNOSIS — E118 Type 2 diabetes mellitus with unspecified complications: Secondary | ICD-10-CM

## 2018-12-30 DIAGNOSIS — I10 Essential (primary) hypertension: Secondary | ICD-10-CM

## 2018-12-30 DIAGNOSIS — J431 Panlobular emphysema: Secondary | ICD-10-CM

## 2018-12-30 DIAGNOSIS — I700 Atherosclerosis of aorta: Secondary | ICD-10-CM | POA: Diagnosis not present

## 2018-12-30 LAB — URINALYSIS, ROUTINE W REFLEX MICROSCOPIC
Bilirubin Urine: NEGATIVE
Hgb urine dipstick: NEGATIVE
Ketones, ur: NEGATIVE
Leukocytes,Ua: NEGATIVE
Nitrite: NEGATIVE
RBC / HPF: NONE SEEN (ref 0–?)
Specific Gravity, Urine: 1.02 (ref 1.000–1.030)
Total Protein, Urine: NEGATIVE
Urine Glucose: NEGATIVE
Urobilinogen, UA: 0.2 (ref 0.0–1.0)
pH: 5 (ref 5.0–8.0)

## 2018-12-30 LAB — BASIC METABOLIC PANEL
BUN: 14 mg/dL (ref 6–23)
CO2: 26 mEq/L (ref 19–32)
Calcium: 9.4 mg/dL (ref 8.4–10.5)
Chloride: 104 mEq/L (ref 96–112)
Creatinine, Ser: 1.16 mg/dL (ref 0.40–1.50)
GFR: 74.55 mL/min (ref >60.00)
Glucose, Bld: 191 mg/dL — ABNORMAL HIGH (ref 70–99)
Potassium: 4.6 mEq/L (ref 3.5–5.1)
Sodium: 138 mEq/L (ref 135–145)

## 2018-12-30 LAB — MICROALBUMIN / CREATININE URINE RATIO
Creatinine,U: 84.7 mg/dL
Microalb Creat Ratio: 1.7 mg/g (ref 0.0–30.0)
Microalb, Ur: 1.5 mg/dL (ref 0.0–1.9)

## 2018-12-30 LAB — LIPID PANEL
Cholesterol: 185 mg/dL (ref 0–200)
HDL: 38.2 mg/dL — ABNORMAL LOW (ref >39.00)
LDL Cholesterol: 129 mg/dL — ABNORMAL HIGH (ref 0–99)
NonHDL: 147.23
Total CHOL/HDL Ratio: 5
Triglycerides: 89 mg/dL (ref 0.0–149.0)
VLDL: 17.8 mg/dL (ref 0.0–40.0)

## 2018-12-30 MED ORDER — INDAPAMIDE 1.25 MG PO TABS: 1.2500 mg | ORAL_TABLET | Freq: Every day | ORAL | 1 refills | Status: DC

## 2018-12-30 MED ORDER — DAPAGLIFLOZIN PROPANEDIOL 10 MG PO TABS: 10.0000 mg | ORAL_TABLET | Freq: Every day | ORAL | 1 refills | Status: DC

## 2018-12-30 MED ORDER — AMLODIPINE BESYLATE 5 MG PO TABS: 5.0000 mg | ORAL_TABLET | Freq: Every day | ORAL | 1 refills | Status: DC

## 2018-12-30 MED ORDER — FLUTICASONE-UMECLIDIN-VILANT 100-62.5-25 MCG/INH IN AEPB: 1.0000 | INHALATION_SPRAY | Freq: Every day | RESPIRATORY_TRACT | 1 refills | Status: DC

## 2018-12-30 MED ORDER — PRAVASTATIN SODIUM 40 MG PO TABS: 40.0000 mg | ORAL_TABLET | Freq: Every day | ORAL | 1 refills | Status: DC

## 2018-12-30 MED ORDER — LOSARTAN POTASSIUM 100 MG PO TABS: 100.0000 mg | ORAL_TABLET | Freq: Every day | ORAL | 1 refills | Status: DC

## 2018-12-30 NOTE — Patient Instructions (Signed)

## 2018-12-30 NOTE — Progress Notes (Signed)
Subjective:  Patient ID: Jesse Garrison, male    DOB: 1946-08-01  Age: 73 y.o. MRN: [id number]  CC: Hypertension; Diabetes; and Hyperlipidemia   HPI Jesse Garrison presents for f/up - He tells me he ran out of his blood pressure and blood sugar medicines a few weeks ago.  He does not monitor his blood pressure or his blood sugar.  He complains of weight gain and frequent urination.  He has chronic, unchanged shortness of breath and wheezing.  He has not recently coughed up blood or phlegm.  He denies chest pain or diaphoresis.  Outpatient Medications Prior to Visit  Medication Sig Dispense Refill   albuterol (PROVENTIL HFA;VENTOLIN HFA) 108 (90 BASE) MCG/ACT inhaler Inhale 2 puffs into the lungs every 6 (six) hours as needed.     Cholecalciferol (VITAMIN D) 50 MCG (2000 UT) tablet TAKE 1 TABLET BY MOUTH EVERY DAY 90 tablet 1   Colchicine (MITIGARE) 0.6 MG CAPS Take 1 tablet by mouth 2 (two) times daily. 60 capsule 5   latanoprost (XALATAN) 0.005 % ophthalmic solution Place 1 drop into both eyes at bedtime.     terazosin (HYTRIN) 5 MG capsule Take 5 mg by mouth at bedtime.       amLODipine (NORVASC) 5 MG tablet TAKE 1 TABLET BY MOUTH EVERY DAY 90 tablet 1   dapagliflozin propanediol (FARXIGA) 10 MG TABS tablet Take 10 mg by mouth daily. 90 tablet 1   Fluticasone-Umeclidin-Vilant (TRELEGY ELLIPTA) 100-62.5-25 MCG/INH AEPB Inhale 1 puff into the lungs daily. 90 each 1   losartan (COZAAR) 100 MG tablet TAKE 1 TABLET BY MOUTH EVERY DAY 90 tablet 1   metFORMIN (GLUCOPHAGE-XR) 750 MG 24 hr tablet Take 2 tablets (1,500 mg total) by mouth daily with breakfast. 180 tablet 1   pravastatin (PRAVACHOL) 40 MG tablet TAKE 1 TABLET BY MOUTH EVERY DAY 90 tablet 1   No facility-administered medications prior to visit.     ROS Review of Systems  Constitutional: Positive for unexpected weight change. Negative for chills, diaphoresis, fatigue and fever.  HENT: Negative.  Negative for sore throat.     Eyes: Negative.   Respiratory: Positive for cough, shortness of breath and wheezing. Negative for chest tightness and stridor.   Gastrointestinal: Negative for abdominal pain, constipation, diarrhea, nausea and vomiting.  Endocrine: Positive for polyphagia and polyuria. Negative for cold intolerance, heat intolerance and polydipsia.  Genitourinary: Negative.  Negative for difficulty urinating.  Musculoskeletal: Negative.  Negative for arthralgias and myalgias.  Skin: Negative.  Negative for color change, pallor and rash.  Neurological: Negative.  Negative for dizziness, weakness, light-headedness and headaches.  Hematological: Negative for adenopathy. Does not bruise/bleed easily.  Psychiatric/Behavioral: Negative.     Objective:  BP (!) 156/68 (BP Location: Left Arm, Patient Position: Sitting, Cuff Size: Large)    Pulse 87    Temp 98.1 F (36.7 C) (Oral)    Resp 16    Ht 5\' 10"  (1.778 m)    Wt 268 lb (121.6 kg)    SpO2 92%    BMI 38.45 kg/m   BP Readings from Last 3 Encounters:  12/30/18 (!) 156/68  07/02/18 138/74  01/22/18 (!) 146/70    Wt Readings from Last 3 Encounters:  12/30/18 268 lb (121.6 kg)  07/02/18 253 lb 8 oz (115 kg)  01/22/18 253 lb 8 oz (115 kg)    Physical Exam Constitutional:      General: He is not in acute distress.    Appearance: He is obese.  He is not ill-appearing, toxic-appearing or diaphoretic.  HENT:     Nose: Nose normal. No congestion or rhinorrhea.     Mouth/Throat:     Mouth: Mucous membranes are moist.     Pharynx: Oropharynx is clear. No oropharyngeal exudate or posterior oropharyngeal erythema.  Eyes:     General: No scleral icterus.    Conjunctiva/sclera: Conjunctivae normal.  Neck:     Musculoskeletal: Normal range of motion and neck supple. No muscular tenderness.  Cardiovascular:     Rate and Rhythm: Normal rate and regular rhythm.     Heart sounds: No murmur. No gallop.   Pulmonary:     Effort: Pulmonary effort is normal. No  respiratory distress.     Breath sounds: No stridor. No wheezing, rhonchi or rales.  Abdominal:     General: Abdomen is flat. There is no distension.     Palpations: There is no hepatomegaly, splenomegaly or mass.     Tenderness: There is no abdominal tenderness.  Musculoskeletal: Normal range of motion.        General: No swelling.     Right lower leg: Edema (trace) present.     Left lower leg: Edema (trace) present.  Lymphadenopathy:     Cervical: No cervical adenopathy.  Skin:    General: Skin is warm and dry.  Neurological:     General: No focal deficit present.     Mental Status: He is alert.  Psychiatric:        Mood and Affect: Mood normal.     Lab Results  Component Value Date   WBC 9.8 01/22/2018   HGB 13.8 01/22/2018   HCT 42.2 01/22/2018   PLT 208.0 01/22/2018   GLUCOSE 191 (H) 12/30/2018   CHOL 185 12/30/2018   TRIG 89.0 12/30/2018   HDL 38.20 (L) 12/30/2018   LDLCALC 129 (H) 12/30/2018   ALT 17 07/02/2018   AST 17 07/02/2018   NA 138 12/30/2018   K 4.6 12/30/2018   CL 104 12/30/2018   CREATININE 1.16 12/30/2018   BUN 14 12/30/2018   CO2 26 12/30/2018   TSH 1.01 01/22/2018   PSA 4.9 10/13/2018   HGBA1C 8.1 (H) 12/30/2018   MICROALBUR 1.5 12/30/2018    Ct Chest W Contrast  Result Date: 01/04/2017 CLINICAL DATA:  Follow-up right upper lobe lung mass. History of COPD. No known tissue sampling of the mass . EXAM: CT CHEST WITH CONTRAST TECHNIQUE: Multidetector CT imaging of the chest was performed during intravenous contrast administration. CONTRAST:  25mL ISOVUE-300 IOPAMIDOL (ISOVUE-300) INJECTION 61% COMPARISON:  02/27/2016 chest radiograph. 03/22/2014 PET-CT. 02/18/2014 chest CT. FINDINGS: Cardiovascular: Normal heart size. No significant pericardial fluid/thickening. Left anterior descending coronary atherosclerosis. Atherosclerotic nonaneurysmal thoracic aorta. Normal caliber pulmonary arteries. No central pulmonary emboli. Mediastinum/Nodes: No discrete  thyroid nodules. Unremarkable esophagus. No pathologically enlarged axillary, mediastinal or hilar lymph nodes. Lungs/Pleura: No pneumothorax. No pleural effusion. Moderate to severe centrilobular emphysema with mild diffuse bronchial wall thickening. Irregular 3.7 x 2.7 cm right upper lobe focus of consolidation (series 3/ image 43), decreased from 4.6 x 3.1 cm on 02/18/2014 chest CT. Nodular subpleural 1.4 x 1.3 cm opacity in the medial right upper lobe (series 3/ image 47) is decreased from 1.8 x 1.6 cm on 02/18/2014. Stable solid subpleural 5 mm pulmonary nodule associated with the minor fissure (series 3/ image 72), considered benign. No acute consolidative airspace disease or new significant pulmonary nodules. Upper abdomen: Hypodense 2.4 cm medial splenic focus (series 2/ image 150), stable  since 02/18/2014, suggesting a benign lesion. Stable appearance of the adrenal glands with irregular mild bilateral adrenal thickening, without discrete adrenal nodules, suggesting adrenal hyperplasia. Musculoskeletal: No aggressive appearing focal osseous lesions. Moderate thoracic spondylosis. IMPRESSION: 1. Irregular 3.7 cm masslike focus of consolidation in the right upper lobe, decreased from 4.6 cm on 02/18/2014 chest CT. Separate smaller subpleural nodular opacity in the medial right upper lobe, also decreased. These findings are most suggestive of foci of postinfectious/postinflammatory scarring, although continued chest CT surveillance is warranted in 1 year. 2. No acute pulmonary disease. No thoracic adenopathy. 3. Moderate to severe emphysema with mild diffuse bronchial wall thickening, compatible with the provided history of COPD. 4. Aortic atherosclerosis.  One vessel coronary atherosclerosis. Electronically Signed   By: Ilona Sorrel M.D.   On: 01/04/2017 15:01    Assessment & Plan:   Jesse Garrison was seen today for hypertension, diabetes and hyperlipidemia.  Diagnoses and all orders for this  visit:  Essential hypertension- His blood pressure is not adequately well controlled.  I think a thiazide diuretic would be a better option than a calcium channel blocker so I have asked him to start taking indapamide.  Will restart losartan as well. -     Discontinue: amLODipine (NORVASC) 5 MG tablet; Take 1 tablet (5 mg total) by mouth daily. -     losartan (COZAAR) 100 MG tablet; Take 1 tablet (100 mg total) by mouth daily. -     indapamide (LOZOL) 1.25 MG tablet; Take 1 tablet (1.25 mg total) by mouth daily. -     Basic metabolic panel; Future -     Urinalysis, Routine w reflex microscopic; Future  Diabetes mellitus type 2 with complications ([place])- His A1c is up to 8.1%.  His blood sugars are not adequately well controlled.  I have asked him to restart metformin and the SGLT2 inhibitor. -     dapagliflozin propanediol (FARXIGA) 10 MG TABS tablet; Take 10 mg by mouth daily. -     Basic metabolic panel; Future -     Hemoglobin A1c; Future -     Microalbumin / creatinine urine ratio; Future -     metFORMIN (GLUCOPHAGE-XR) 750 MG 24 hr tablet; Take 2 tablets (1,500 mg total) by mouth daily with breakfast.  Type 2 diabetes mellitus with complication, without long-term current use of insulin ([facility]) -     losartan (COZAAR) 100 MG tablet; Take 1 tablet (100 mg total) by mouth daily. -     pravastatin (PRAVACHOL) 40 MG tablet; Take 1 tablet (40 mg total) by mouth daily. -     Hemoglobin A1c; Future -     Microalbumin / creatinine urine ratio; Future -     metFORMIN (GLUCOPHAGE-XR) 750 MG 24 hr tablet; Take 2 tablets (1,500 mg total) by mouth daily with breakfast.  Hyperlipidemia with target LDL less than 100- He has not achieved his LDL goal.  It looks like he is not compliant with the statin.  I have asked him to restart pravastatin. -     pravastatin (PRAVACHOL) 40 MG tablet; Take 1 tablet (40 mg total) by mouth daily. -     Lipid panel; Future  Panlobular emphysema ([place]) -      Fluticasone-Umeclidin-Vilant (TRELEGY ELLIPTA) 100-62.5-25 MCG/INH AEPB; Inhale 1 puff into the lungs daily.   I have discontinued Doak Marando's amLODipine and amLODipine. I have also changed his losartan and pravastatin. Additionally, I am having him start on indapamide. Lastly, I am having him maintain his terazosin,  albuterol, Colchicine, Vitamin D, latanoprost, dapagliflozin propanediol, Fluticasone-Umeclidin-Vilant, and metFORMIN.  Meds ordered this encounter  Medications   dapagliflozin propanediol (FARXIGA) 10 MG TABS tablet    Sig: Take 10 mg by mouth daily.    Dispense:  90 tablet    Refill:  1   DISCONTD: amLODipine (NORVASC) 5 MG tablet    Sig: Take 1 tablet (5 mg total) by mouth daily.    Dispense:  90 tablet    Refill:  1   losartan (COZAAR) 100 MG tablet    Sig: Take 1 tablet (100 mg total) by mouth daily.    Dispense:  90 tablet    Refill:  1   pravastatin (PRAVACHOL) 40 MG tablet    Sig: Take 1 tablet (40 mg total) by mouth daily.    Dispense:  90 tablet    Refill:  1   Fluticasone-Umeclidin-Vilant (TRELEGY ELLIPTA) 100-62.5-25 MCG/INH AEPB    Sig: Inhale 1 puff into the lungs daily.    Dispense:  90 each    Refill:  1   indapamide (LOZOL) 1.25 MG tablet    Sig: Take 1 tablet (1.25 mg total) by mouth daily.    Dispense:  90 tablet    Refill:  1   metFORMIN (GLUCOPHAGE-XR) 750 MG 24 hr tablet    Sig: Take 2 tablets (1,500 mg total) by mouth daily with breakfast.    Dispense:  180 tablet    Refill:  1     Follow-up: Return in about 4 months (around 05/02/2019).  Scarlette Calico, MD

## 2018-12-31 ENCOUNTER — Encounter: Payer: Self-pay | Admitting: Internal Medicine

## 2018-12-31 LAB — HEMOGLOBIN A1C: Hgb A1c MFr Bld: 8.1 % — ABNORMAL HIGH (ref 4.6–6.5)

## 2018-12-31 MED ORDER — METFORMIN HCL ER 750 MG PO TB24: 1500.0000 mg | ORAL_TABLET | Freq: Every day | ORAL | 1 refills | Status: DC

## 2019-04-27 ENCOUNTER — Other Ambulatory Visit: Payer: Self-pay

## 2019-04-27 ENCOUNTER — Other Ambulatory Visit (INDEPENDENT_AMBULATORY_CARE_PROVIDER_SITE_OTHER): Payer: Medicare Other

## 2019-04-27 ENCOUNTER — Ambulatory Visit (INDEPENDENT_AMBULATORY_CARE_PROVIDER_SITE_OTHER): Payer: Medicare Other | Admitting: Internal Medicine

## 2019-04-27 ENCOUNTER — Encounter: Payer: Self-pay | Admitting: Internal Medicine

## 2019-04-27 VITALS — BP 138/86 | HR 83 | Temp 99.3°F | Resp 16 | Ht 70.0 in | Wt 257.0 lb

## 2019-04-27 DIAGNOSIS — Z0000 Encounter for general adult medical examination without abnormal findings: Secondary | ICD-10-CM | POA: Diagnosis not present

## 2019-04-27 DIAGNOSIS — Z23 Encounter for immunization: Secondary | ICD-10-CM | POA: Diagnosis not present

## 2019-04-27 DIAGNOSIS — Z860100 Personal history of colon polyps, unspecified: Secondary | ICD-10-CM

## 2019-04-27 DIAGNOSIS — E118 Type 2 diabetes mellitus with unspecified complications: Secondary | ICD-10-CM

## 2019-04-27 DIAGNOSIS — N400 Enlarged prostate without lower urinary tract symptoms: Secondary | ICD-10-CM | POA: Diagnosis not present

## 2019-04-27 DIAGNOSIS — I10 Essential (primary) hypertension: Secondary | ICD-10-CM

## 2019-04-27 DIAGNOSIS — R972 Elevated prostate specific antigen [PSA]: Secondary | ICD-10-CM | POA: Diagnosis not present

## 2019-04-27 DIAGNOSIS — H6123 Impacted cerumen, bilateral: Secondary | ICD-10-CM | POA: Diagnosis not present

## 2019-04-27 LAB — BASIC METABOLIC PANEL
BUN: 16 mg/dL (ref 6–23)
CO2: 26 mEq/L (ref 19–32)
Calcium: 10.3 mg/dL (ref 8.4–10.5)
Chloride: 103 mEq/L (ref 96–112)
Creatinine, Ser: 1.3 mg/dL (ref 0.40–1.50)
GFR: 65.3 mL/min (ref >60.00)
Glucose, Bld: 115 mg/dL — ABNORMAL HIGH (ref 70–99)
Potassium: 4.3 mEq/L (ref 3.5–5.1)
Sodium: 139 mEq/L (ref 135–145)

## 2019-04-27 LAB — PSA: PSA: 4

## 2019-04-27 LAB — TSH: TSH: 0.94 u[IU]/mL (ref 0.35–4.50)

## 2019-04-27 LAB — HEMOGLOBIN A1C: Hgb A1c MFr Bld: 7.8 % — ABNORMAL HIGH (ref 4.6–6.5)

## 2019-04-27 MED ORDER — FREESTYLE LIBRE 14 DAY SENSOR MISC: 1.0000 | Freq: Every day | 11 refills | Status: DC

## 2019-04-27 MED ORDER — FREESTYLE LIBRE 14 DAY READER DEVI: 1.0000 | Freq: Every day | 11 refills | Status: DC

## 2019-04-27 NOTE — Progress Notes (Signed)
Subjective:  Patient ID: Jesse Garrison, male    DOB: 1945/09/14  Age: 73 y.o. MRN: [id number]  CC: Annual Exam, Hypertension, and Diabetes   HPI Jesse Garrison presents for a CPX.   He has been working on his lifestyle modifications and has been able to lose weight recently.  He wants to use a continuous glucose monitor.  He denies any recent episodes of CP, DOE, diaphoresis, polyuria, polydipsia, or polyphagia.  He complains of a decreased level of hearing over the last few months.  Outpatient Medications Prior to Visit  Medication Sig Dispense Refill   albuterol (PROVENTIL HFA;VENTOLIN HFA) 108 (90 BASE) MCG/ACT inhaler Inhale 2 puffs into the lungs every 6 (six) hours as needed.     Cholecalciferol (VITAMIN D) 50 MCG (2000 UT) tablet TAKE 1 TABLET BY MOUTH EVERY DAY 90 tablet 1   Colchicine (MITIGARE) 0.6 MG CAPS Take 1 tablet by mouth 2 (two) times daily. 60 capsule 5   dapagliflozin propanediol (FARXIGA) 10 MG TABS tablet Take 10 mg by mouth daily. 90 tablet 1   Fluticasone-Umeclidin-Vilant (TRELEGY ELLIPTA) 100-62.5-25 MCG/INH AEPB Inhale 1 puff into the lungs daily. 90 each 1   indapamide (LOZOL) 1.25 MG tablet Take 1 tablet (1.25 mg total) by mouth daily. 90 tablet 1   latanoprost (XALATAN) 0.005 % ophthalmic solution Place 1 drop into both eyes at bedtime.     losartan (COZAAR) 100 MG tablet Take 1 tablet (100 mg total) by mouth daily. 90 tablet 1   metFORMIN (GLUCOPHAGE-XR) 750 MG 24 hr tablet Take 2 tablets (1,500 mg total) by mouth daily with breakfast. 180 tablet 1   pravastatin (PRAVACHOL) 40 MG tablet Take 1 tablet (40 mg total) by mouth daily. 90 tablet 1   terazosin (HYTRIN) 5 MG capsule Take 5 mg by mouth at bedtime.       No facility-administered medications prior to visit.     ROS Review of Systems  Constitutional: Negative for diaphoresis, fatigue and unexpected weight change.  HENT: Positive for hearing loss. Negative for ear pain and sinus pressure.     Eyes: Negative for visual disturbance.  Respiratory: Negative for cough, chest tightness, shortness of breath and wheezing.   Cardiovascular: Negative for chest pain, palpitations and leg swelling.  Gastrointestinal: Negative for abdominal pain, blood in stool, constipation, diarrhea, nausea and vomiting.  Endocrine: Negative.   Genitourinary: Negative.  Negative for difficulty urinating, frequency, penile swelling, scrotal swelling, testicular pain and urgency.  Musculoskeletal: Negative for arthralgias and myalgias.  Skin: Negative.  Negative for color change and pallor.  Neurological: Negative.  Negative for dizziness, weakness, light-headedness and numbness.  Hematological: Negative for adenopathy. Does not bruise/bleed easily.  Psychiatric/Behavioral: Negative.     Objective:  BP 138/86 (BP Location: Left Arm, Patient Position: Sitting, Cuff Size: Large)    Pulse 83    Temp 99.3 F (37.4 C) (Oral)    Resp 16    Ht 5\' 10"  (1.778 m)    Wt 257 lb (116.6 kg)    SpO2 94%    BMI 36.88 kg/m   BP Readings from Last 3 Encounters:  04/27/19 138/86  12/30/18 (!) 156/68  07/02/18 138/74    Wt Readings from Last 3 Encounters:  04/27/19 257 lb (116.6 kg)  12/30/18 268 lb (121.6 kg)  07/02/18 253 lb 8 oz (115 kg)    Physical Exam Vitals signs reviewed.  Constitutional:      Appearance: He is obese. He is not ill-appearing or diaphoretic.  HENT:     Right Ear: Tympanic membrane, ear canal and external ear normal. Decreased hearing noted. No drainage, swelling or tenderness. There is impacted cerumen. No foreign body.     Left Ear: Tympanic membrane, ear canal and external ear normal. Decreased hearing noted. No drainage, swelling or tenderness. There is impacted cerumen. No foreign body.     Ears:     Comments: I put Colace in both ears and then I used an ear pick to the remove the cerumen.  After this he tells me his hearing has returned to normal and his symptoms have resolved.  He  tolerated this well.  The examination afterwards is normal.    Nose: Nose normal.     Mouth/Throat:     Mouth: Mucous membranes are moist.  Eyes:     Conjunctiva/sclera: Conjunctivae normal.  Neck:     Musculoskeletal: Normal range of motion and neck supple.  Cardiovascular:     Rate and Rhythm: Normal rate and regular rhythm.     Heart sounds: No murmur.  Pulmonary:     Effort: Pulmonary effort is normal.     Breath sounds: No stridor. No wheezing, rhonchi or rales.  Abdominal:     General: Abdomen is protuberant. Bowel sounds are normal. There is no distension.     Palpations: There is no hepatomegaly, splenomegaly or mass.     Tenderness: There is no abdominal tenderness. There is no guarding.     Hernia: No hernia is present. There is no hernia in the left inguinal area or right inguinal area.  Genitourinary:    Pubic Area: No rash.      Penis: Normal and circumcised. No discharge, swelling or lesions.      Scrotum/Testes: Normal.        Right: Mass, tenderness or swelling not present.        Left: Tenderness or swelling not present.     Epididymis:     Right: Normal. Not inflamed or enlarged. No mass.     Left: Normal. Not inflamed or enlarged. No mass.     Prostate: Enlarged. Not tender and no nodules present.     Rectum: Normal. Guaiac result negative. No mass, tenderness, anal fissure, external hemorrhoid or internal hemorrhoid. Normal anal tone.  Musculoskeletal: Normal range of motion.     Right lower leg: No edema.     Left lower leg: No edema.  Lymphadenopathy:     Cervical: No cervical adenopathy.     Lower Body: No right inguinal adenopathy. No left inguinal adenopathy.  Skin:    General: Skin is warm and dry.  Neurological:     General: No focal deficit present.     Mental Status: He is alert and oriented to person, place, and time. Mental status is at baseline.  Psychiatric:        Mood and Affect: Mood normal.        Behavior: Behavior normal.     Lab  Results  Component Value Date   WBC 9.8 01/22/2018   HGB 13.8 01/22/2018   HCT 42.2 01/22/2018   PLT 208.0 01/22/2018   GLUCOSE 115 (H) 04/27/2019   CHOL 185 12/30/2018   TRIG 89.0 12/30/2018   HDL 38.20 (L) 12/30/2018   LDLCALC 129 (H) 12/30/2018   ALT 17 07/02/2018   AST 17 07/02/2018   NA 139 04/27/2019   K 4.3 04/27/2019   CL 103 04/27/2019   CREATININE 1.30 04/27/2019   BUN 16 04/27/2019  CO2 26 04/27/2019   TSH 0.94 04/27/2019   PSA 4.0 04/27/2019   HGBA1C 7.8 (H) 04/27/2019   MICROALBUR 1.5 12/30/2018    Ct Chest W Contrast  Result Date: 01/04/2017 CLINICAL DATA:  Follow-up right upper lobe lung mass. History of COPD. No known tissue sampling of the mass . EXAM: CT CHEST WITH CONTRAST TECHNIQUE: Multidetector CT imaging of the chest was performed during intravenous contrast administration. CONTRAST:  46mL ISOVUE-300 IOPAMIDOL (ISOVUE-300) INJECTION 61% COMPARISON:  02/27/2016 chest radiograph. 03/22/2014 PET-CT. 02/18/2014 chest CT. FINDINGS: Cardiovascular: Normal heart size. No significant pericardial fluid/thickening. Left anterior descending coronary atherosclerosis. Atherosclerotic nonaneurysmal thoracic aorta. Normal caliber pulmonary arteries. No central pulmonary emboli. Mediastinum/Nodes: No discrete thyroid nodules. Unremarkable esophagus. No pathologically enlarged axillary, mediastinal or hilar lymph nodes. Lungs/Pleura: No pneumothorax. No pleural effusion. Moderate to severe centrilobular emphysema with mild diffuse bronchial wall thickening. Irregular 3.7 x 2.7 cm right upper lobe focus of consolidation (series 3/ image 43), decreased from 4.6 x 3.1 cm on 02/18/2014 chest CT. Nodular subpleural 1.4 x 1.3 cm opacity in the medial right upper lobe (series 3/ image 47) is decreased from 1.8 x 1.6 cm on 02/18/2014. Stable solid subpleural 5 mm pulmonary nodule associated with the minor fissure (series 3/ image 72), considered benign. No acute consolidative airspace  disease or new significant pulmonary nodules. Upper abdomen: Hypodense 2.4 cm medial splenic focus (series 2/ image 150), stable since 02/18/2014, suggesting a benign lesion. Stable appearance of the adrenal glands with irregular mild bilateral adrenal thickening, without discrete adrenal nodules, suggesting adrenal hyperplasia. Musculoskeletal: No aggressive appearing focal osseous lesions. Moderate thoracic spondylosis. IMPRESSION: 1. Irregular 3.7 cm masslike focus of consolidation in the right upper lobe, decreased from 4.6 cm on 02/18/2014 chest CT. Separate smaller subpleural nodular opacity in the medial right upper lobe, also decreased. These findings are most suggestive of foci of postinfectious/postinflammatory scarring, although continued chest CT surveillance is warranted in 1 year. 2. No acute pulmonary disease. No thoracic adenopathy. 3. Moderate to severe emphysema with mild diffuse bronchial wall thickening, compatible with the provided history of COPD. 4. Aortic atherosclerosis.  One vessel coronary atherosclerosis. Electronically Signed   By: Ilona Sorrel M.D.   On: 01/04/2017 15:01    Assessment & Plan:   Dina was seen today for annual exam, hypertension and diabetes.  Diagnoses and all orders for this visit:  Need for influenza vaccination -     Flu Vaccine QUAD High Dose(Fluad)  Essential hypertension- His blood pressure is adequately well controlled. -     Basic metabolic panel; Future -     TSH; Future  Diabetes mellitus type 2 with complications ([place]) - His A1c is at 7.8%.  I have asked him to improve his lifestyle modifications and to continue the current medications. -     Basic metabolic panel; Future -     Hemoglobin A1c; Future -     Continuous Blood Gluc Receiver (FREESTYLE LIBRE 14 DAY READER) DEVI; 1 Act by Does not apply route daily. -     Continuous Blood Gluc Sensor (FREESTYLE LIBRE 14 DAY SENSOR) MISC; 1 Act by Does not apply route daily. -     Ambulatory  referral to Ophthalmology  Benign prostatic hyperplasia without lower urinary tract symptoms- His symptoms are adequately well controlled with the peripheral alpha-blocker. -     PSA, total and free; Future  PSA elevation- His PSA is down to 4.  This is reassuring that he does not have prostate cancer. -  PSA, total and free; Future  Routine general medical examination at a health care facility- Exam completed, labs reviewed, vaccines reviewed and updated, he is referred for a follow-up colonoscopy regarding history of colon polyps, patient education was given.  Hearing loss of both ears due to cerumen impaction- Symptoms resolved after the cerumen was removed.  Personal history of colonic adenomas -     Ambulatory referral to Gastroenterology  Need for pneumococcal vaccination -     Pneumococcal polysaccharide vaccine 23-valent greater than or equal to 2yo subcutaneous/IM   I am having Thomes Dinning start on [employer] 14 Day Reader and [employer] 14 Day Sensor. I am also having him maintain his terazosin, albuterol, Colchicine, Vitamin D, latanoprost, dapagliflozin propanediol, losartan, pravastatin, Fluticasone-Umeclidin-Vilant, indapamide, and metFORMIN.  Meds ordered this encounter  Medications   Continuous Blood Gluc Receiver (FREESTYLE LIBRE 14 DAY READER) DEVI    Sig: 1 Act by Does not apply route daily.    Dispense:  2 Device    Refill:  11   Continuous Blood Gluc Sensor (FREESTYLE LIBRE 14 DAY SENSOR) MISC    Sig: 1 Act by Does not apply route daily.    Dispense:  2 each    Refill:  11     Follow-up: Return in about 6 months (around 10/25/2019).  Scarlette Calico, MD

## 2019-04-27 NOTE — Patient Instructions (Signed)

## 2019-04-28 ENCOUNTER — Encounter: Payer: Self-pay | Admitting: Internal Medicine

## 2019-04-28 LAB — PSA, TOTAL AND FREE
PSA, % Free: 28 % (calc) (ref >25)
PSA, Free: 1.1 ng/mL
PSA, Total: 4 ng/mL (ref <=4.0)

## 2019-04-28 NOTE — Progress Notes (Signed)
Patient consent obtained. Irrigation with water and peroxide performed. Full view of bilateral tympanic membranes after procedure.  Patient tolerated procedure well.   

## 2019-04-30 ENCOUNTER — Telehealth: Payer: Self-pay

## 2019-04-30 NOTE — Telephone Encounter (Signed)
Form received and filled out. Given to PCP to sign.

## 2019-04-30 NOTE — Telephone Encounter (Signed)
Called patient and informed that we have not received the forms yet.

## 2019-05-07 ENCOUNTER — Encounter: Payer: Self-pay | Admitting: Internal Medicine

## 2019-05-08 NOTE — Telephone Encounter (Signed)
Form has been faxed. Pt informed of same.

## 2019-05-20 ENCOUNTER — Other Ambulatory Visit: Payer: Self-pay | Admitting: Internal Medicine

## 2019-05-20 DIAGNOSIS — E785 Hyperlipidemia, unspecified: Secondary | ICD-10-CM

## 2019-05-20 DIAGNOSIS — I10 Essential (primary) hypertension: Secondary | ICD-10-CM

## 2019-05-20 DIAGNOSIS — E118 Type 2 diabetes mellitus with unspecified complications: Secondary | ICD-10-CM

## 2019-05-25 ENCOUNTER — Other Ambulatory Visit: Payer: Self-pay | Admitting: Internal Medicine

## 2019-05-25 DIAGNOSIS — I10 Essential (primary) hypertension: Secondary | ICD-10-CM

## 2019-05-26 ENCOUNTER — Telehealth: Payer: Self-pay | Admitting: Internal Medicine

## 2019-05-26 ENCOUNTER — Other Ambulatory Visit: Payer: Self-pay

## 2019-05-26 ENCOUNTER — Ambulatory Visit (AMBULATORY_SURGERY_CENTER): Payer: Self-pay | Admitting: *Deleted

## 2019-05-26 VITALS — Temp 97.9°F | Ht 70.0 in | Wt 254.8 lb

## 2019-05-26 DIAGNOSIS — Z86010 Personal history of colonic polyps: Secondary | ICD-10-CM

## 2019-05-26 DIAGNOSIS — M1711 Unilateral primary osteoarthritis, right knee: Secondary | ICD-10-CM

## 2019-05-26 NOTE — Telephone Encounter (Signed)
Pt came down after GI visit. He is requesting PT for right knee pain.   Order has been entered. They will let me know if the 04/27/2019 is too old to use for the referral.

## 2019-05-26 NOTE — Telephone Encounter (Signed)
Pt would like a referral for a personal home health aid for his legs, I explained some insurance does not cover a personal home health aid and he said his does. Please advise

## 2019-05-26 NOTE — Telephone Encounter (Signed)
Patient would like physical therapy for his right leg.

## 2019-05-26 NOTE — Progress Notes (Signed)

## 2019-05-29 ENCOUNTER — Encounter: Payer: Self-pay | Admitting: Internal Medicine

## 2019-06-08 ENCOUNTER — Telehealth: Payer: Self-pay | Admitting: Internal Medicine

## 2019-06-08 NOTE — Telephone Encounter (Signed)
Verbal okay given for PT as requested.

## 2019-06-08 NOTE — Telephone Encounter (Signed)
Pt was seen by [facility] on 06/05/2019 and they will be doing PT 2x wk for 6 wks and they will be doing some tele health monitoring, for medication checks and vital checks

## 2019-06-09 ENCOUNTER — Encounter: Payer: Medicare Other | Admitting: Internal Medicine

## 2019-06-18 ENCOUNTER — Telehealth: Payer: Self-pay | Admitting: Internal Medicine

## 2019-06-18 NOTE — Telephone Encounter (Signed)
Received a call from [place] and she states their auditor said pt will need a face to face appointment with Dr. Ronnald Ramp stating pt needs  PT for strengthening and if you can get him in before 11/23.  thanks

## 2019-06-18 NOTE — Telephone Encounter (Signed)
Called [facility] and spoke to [place]. Jana Half stated that F2F on 04/27/2019 did not meet the Medicare requirements for need for [facility] PT.   Pt contacted and informed of same. Pt states that PT (who has been coming out) is not spending but 15 minutes and checking BP. Pt states that he doesn't think that he needs it anymore. Pt will ask PT next time they come to see if they think he needs to continue.

## 2019-06-21 ENCOUNTER — Other Ambulatory Visit: Payer: Self-pay | Admitting: Internal Medicine

## 2019-06-21 DIAGNOSIS — E118 Type 2 diabetes mellitus with unspecified complications: Secondary | ICD-10-CM

## 2019-06-29 NOTE — Telephone Encounter (Signed)
[facility] is still going out to see Jesse Garrison. They will need a face to face visit before 11/23.  Can you please call pt and set up an appt?

## 2019-06-30 NOTE — Telephone Encounter (Signed)
Left message for Jana Half at [facility] - pt refused to schedule an appointment.

## 2019-06-30 NOTE — Telephone Encounter (Signed)
Jesse Garrison called back and left vm stating that there was nothing I can do if patient refuses to be seen. She stated that he would need to be dc'ed from [facility] services.

## 2019-08-20 ENCOUNTER — Other Ambulatory Visit: Payer: Self-pay | Admitting: Internal Medicine

## 2019-09-01 ENCOUNTER — Encounter: Payer: Self-pay | Admitting: Internal Medicine

## 2019-09-13 ENCOUNTER — Other Ambulatory Visit: Payer: Self-pay | Admitting: Internal Medicine

## 2019-09-13 DIAGNOSIS — E118 Type 2 diabetes mellitus with unspecified complications: Secondary | ICD-10-CM

## 2019-09-21 ENCOUNTER — Telehealth: Payer: Self-pay

## 2019-09-21 ENCOUNTER — Other Ambulatory Visit: Payer: Self-pay | Admitting: Internal Medicine

## 2019-09-21 DIAGNOSIS — M1000 Idiopathic gout, unspecified site: Secondary | ICD-10-CM

## 2019-09-21 MED ORDER — COLCHICINE 0.6 MG PO CAPS: 1.0000 | ORAL_CAPSULE | Freq: Two times a day (BID) | ORAL | 5 refills | Status: DC

## 2019-09-21 NOTE — Telephone Encounter (Signed)
New message   Medication Requested:  Is medication on med list (if no, inform pt they may need an appointment): medication for gout - patient did not know the name   Is medication a controled (yes = last OV with PCP): no   Is the OV > than 4 months (yes = schedule an appt if one is not already made): 9.14.2020  Pharmacy (Name [facility]): [facility] [address]

## 2019-09-21 NOTE — Telephone Encounter (Signed)
[facility] [address]. Please advise.

## 2019-10-15 ENCOUNTER — Telehealth: Payer: Self-pay

## 2019-10-15 NOTE — Telephone Encounter (Signed)
Patient calling and states that the Colchicine (MITIGARE) 0.6 MG CAPS Are too expensive. Would like to know if the Tablets could be called in? Please advise.  [id number] [place], [place] [address]

## 2019-10-16 MED ORDER — COLCHICINE 0.6 MG PO TABS: 0.6000 mg | ORAL_TABLET | Freq: Every day | ORAL | 1 refills | Status: DC

## 2019-10-16 NOTE — Telephone Encounter (Signed)
Pt contacted and requested the tablets instead of capsules.   Rx changed to reflect request.

## 2019-11-24 ENCOUNTER — Encounter: Payer: Self-pay | Admitting: Internal Medicine

## 2019-11-24 LAB — HM DIABETES EYE EXAM

## 2019-12-10 ENCOUNTER — Other Ambulatory Visit: Payer: Self-pay | Admitting: Internal Medicine

## 2019-12-10 DIAGNOSIS — E118 Type 2 diabetes mellitus with unspecified complications: Secondary | ICD-10-CM

## 2019-12-12 ENCOUNTER — Other Ambulatory Visit: Payer: Self-pay | Admitting: Internal Medicine

## 2020-01-03 ENCOUNTER — Other Ambulatory Visit: Payer: Self-pay | Admitting: Internal Medicine

## 2020-02-29 ENCOUNTER — Telehealth: Payer: Self-pay | Admitting: Internal Medicine

## 2020-02-29 NOTE — Telephone Encounter (Signed)
New Message:   Jesse Garrison is calling and states they received a fax from [place] and the whole fax did not come through. He states if you can please call him so he can guide you through filling out the form due to some parts being blank. Please advise.

## 2020-03-01 NOTE — Telephone Encounter (Signed)
This has been refaxed 

## 2020-03-03 ENCOUNTER — Ambulatory Visit: Payer: Medicare Other | Admitting: Internal Medicine

## 2020-03-03 DIAGNOSIS — Z0289 Encounter for other administrative examinations: Secondary | ICD-10-CM

## 2020-03-04 ENCOUNTER — Telehealth: Payer: Self-pay

## 2020-03-04 NOTE — Telephone Encounter (Signed)
New message    [facility] voiced to please call them back due to medical necessities. Aware of CMA's previous message forms was re faxed over.      Fax #  [phone]  Fax #  [phone]

## 2020-03-08 ENCOUNTER — Other Ambulatory Visit: Payer: Self-pay

## 2020-03-08 ENCOUNTER — Encounter: Payer: Self-pay | Admitting: Internal Medicine

## 2020-03-08 ENCOUNTER — Ambulatory Visit (INDEPENDENT_AMBULATORY_CARE_PROVIDER_SITE_OTHER): Payer: Medicare (Managed Care) | Admitting: Internal Medicine

## 2020-03-08 VITALS — BP 164/90 | HR 101 | Temp 98.3°F | Ht 70.0 in | Wt 258.5 lb

## 2020-03-08 DIAGNOSIS — E118 Type 2 diabetes mellitus with unspecified complications: Secondary | ICD-10-CM | POA: Diagnosis not present

## 2020-03-08 DIAGNOSIS — Z23 Encounter for immunization: Secondary | ICD-10-CM | POA: Diagnosis not present

## 2020-03-08 DIAGNOSIS — N1831 Chronic kidney disease, stage 3a: Secondary | ICD-10-CM

## 2020-03-08 DIAGNOSIS — I10 Essential (primary) hypertension: Secondary | ICD-10-CM | POA: Diagnosis not present

## 2020-03-08 DIAGNOSIS — J431 Panlobular emphysema: Secondary | ICD-10-CM

## 2020-03-08 DIAGNOSIS — R0683 Snoring: Secondary | ICD-10-CM

## 2020-03-08 DIAGNOSIS — N400 Enlarged prostate without lower urinary tract symptoms: Secondary | ICD-10-CM

## 2020-03-08 DIAGNOSIS — E559 Vitamin D deficiency, unspecified: Secondary | ICD-10-CM

## 2020-03-08 DIAGNOSIS — Z0000 Encounter for general adult medical examination without abnormal findings: Secondary | ICD-10-CM

## 2020-03-08 DIAGNOSIS — E785 Hyperlipidemia, unspecified: Secondary | ICD-10-CM

## 2020-03-08 DIAGNOSIS — R972 Elevated prostate specific antigen [PSA]: Secondary | ICD-10-CM

## 2020-03-08 DIAGNOSIS — M1000 Idiopathic gout, unspecified site: Secondary | ICD-10-CM

## 2020-03-08 LAB — POCT GLYCOSYLATED HEMOGLOBIN (HGB A1C): Hemoglobin A1C: 9 % — AB (ref 4.0–5.6)

## 2020-03-08 LAB — POCT GLUCOSE (DEVICE FOR HOME USE): Glucose Fasting, POC: 311 mg/dL — AB (ref 70–99)

## 2020-03-08 LAB — PSA: PSA: 4.6

## 2020-03-08 MED ORDER — FREESTYLE LIBRE 14 DAY SENSOR MISC: 1.0000 | Freq: Every day | 5 refills | Status: AC

## 2020-03-08 MED ORDER — REVEFENACIN 175 MCG/3ML IN SOLN: 175.0000 ug | Freq: Every day | RESPIRATORY_TRACT | 1 refills | Status: DC

## 2020-03-08 MED ORDER — PRAVASTATIN SODIUM 40 MG PO TABS: 40.0000 mg | ORAL_TABLET | Freq: Every day | ORAL | 1 refills | Status: DC

## 2020-03-08 MED ORDER — FREESTYLE LIBRE 14 DAY READER DEVI: 1.0000 | Freq: Every day | 5 refills | Status: AC

## 2020-03-08 MED ORDER — SOLIQUA 100-33 UNT-MCG/ML ~~LOC~~ SOPN: 20.0000 [IU] | PEN_INJECTOR | Freq: Every day | SUBCUTANEOUS | 0 refills | Status: DC

## 2020-03-08 MED ORDER — ARFORMOTEROL TARTRATE 15 MCG/2ML IN NEBU: 15.0000 ug | INHALATION_SOLUTION | Freq: Two times a day (BID) | RESPIRATORY_TRACT | 1 refills | Status: DC

## 2020-03-08 MED ORDER — NOVOFINE 32G X 6 MM MISC: 1.0000 | Freq: Every day | 1 refills | Status: AC

## 2020-03-08 NOTE — Telephone Encounter (Signed)
I have refaxed the request and it is still denied.

## 2020-03-08 NOTE — Patient Instructions (Signed)

## 2020-03-08 NOTE — Progress Notes (Signed)
Subjective:  Patient ID: Jesse Garrison, male    DOB: March 16, 1946  Age: 74 y.o. MRN: [id number]  CC: Annual Exam  This visit occurred during the SARS-CoV-2 public health emergency.  Safety protocols were in place, including screening questions prior to the visit, additional usage of staff PPE, and extensive cleaning of exam room while observing appropriate contact time as indicated for disinfecting solutions.    HPI [name] presents for a CPX.  He returns for follow-up after a hiatus.  He is noncompliant with most of his meds.  He continues to smoke cigarettes in the form of vaping.  Over the last few months he has started feeling poorly with polys, shortness of breath, weight gain, and dyspnea on exertion.  He denies chest pain, diaphoresis, palpitations, or edema.  He has had a few episodes of orthostatic dizziness.  Outpatient Medications Prior to Visit  Medication Sig Dispense Refill  . albuterol (PROVENTIL HFA;VENTOLIN HFA) 108 (90 BASE) MCG/ACT inhaler Inhale 2 puffs into the lungs every 6 (six) hours as needed.    . latanoprost (XALATAN) 0.005 % ophthalmic solution Place 1 drop into both eyes at bedtime.    Marland Kitchen amLODipine (NORVASC) 5 MG tablet TAKE 1 TABLET BY MOUTH EVERY DAY 90 tablet 1  . Cholecalciferol (VITAMIN D) 50 MCG (2000 UT) tablet TAKE 1 TABLET BY MOUTH EVERY DAY 90 tablet 1  . colchicine 0.6 MG tablet Take 1 tablet (0.6 mg total) by mouth daily. 60 tablet 1  . Continuous Blood Gluc Receiver (FREESTYLE LIBRE 14 DAY READER) DEVI 1 Act by Does not apply route daily. 2 Device 11  . Continuous Blood Gluc Sensor (FREESTYLE LIBRE 14 DAY SENSOR) MISC 1 Act by Does not apply route daily. 2 each 11  . dapagliflozin propanediol (FARXIGA) 10 MG TABS tablet Take 10 mg by mouth daily. 90 tablet 1  . Fluticasone-Umeclidin-Vilant (TRELEGY ELLIPTA) 100-62.5-25 MCG/INH AEPB Inhale 1 puff into the lungs daily. 90 each 1  . indapamide (LOZOL) 1.25 MG tablet TAKE 1 TABLET (1.25 MG TOTAL) BY  MOUTH DAILY. 90 tablet 0  . losartan (COZAAR) 100 MG tablet Take 1 tablet (100 mg total) by mouth daily. 90 tablet 1  . metFORMIN (GLUCOPHAGE-XR) 750 MG 24 hr tablet TAKE 2 TABLETS BY MOUTH DAILY WITH BREAKFAST 180 tablet 0  . pravastatin (PRAVACHOL) 40 MG tablet TAKE 1 TABLET BY MOUTH EVERY DAY 90 tablet 1  . terazosin (HYTRIN) 5 MG capsule Take 5 mg by mouth at bedtime.       No facility-administered medications prior to visit.    ROS Review of Systems  Constitutional: Positive for unexpected weight change (wt gain). Negative for appetite change, chills, diaphoresis and fatigue.  HENT: Negative.  Negative for trouble swallowing.   Eyes: Negative for visual disturbance.  Respiratory: Positive for shortness of breath and wheezing. Negative for apnea, cough and chest tightness.        ++DOE  Cardiovascular: Negative for chest pain, palpitations and leg swelling.  Gastrointestinal: Negative for abdominal pain, constipation, diarrhea, nausea and vomiting.  Endocrine: Positive for polydipsia, polyphagia and polyuria.  Genitourinary: Positive for frequency. Negative for difficulty urinating, hematuria, scrotal swelling, testicular pain and urgency.  Musculoskeletal: Negative.  Negative for back pain, myalgias and neck pain.  Skin: Negative.   Neurological: Positive for dizziness. Negative for weakness, light-headedness, numbness and headaches.       ++orthostatic dizziness  Hematological: Negative for adenopathy. Does not bruise/bleed easily.    Objective:  BP (!) 164/90 (  BP Location: Left Arm, Patient Position: Sitting, Cuff Size: Large)   Pulse 101   Temp 98.3 F (36.8 C) (Oral)   Ht 5\' 10"  (1.778 m)   Wt (!) 258 lb 8 oz (117.3 kg)   SpO2 91%   BMI 37.09 kg/m   BP Readings from Last 3 Encounters:  03/08/20 (!) 164/90  04/27/19 138/86  12/30/18 (!) 156/68    Wt Readings from Last 3 Encounters:  03/08/20 (!) 258 lb 8 oz (117.3 kg)  05/26/19 254 lb 12.8 oz (115.6 kg)   04/27/19 257 lb (116.6 kg)    Physical Exam Vitals reviewed. Exam conducted with a chaperone present Everette Rank).  Constitutional:      General: He is not in acute distress.    Appearance: He is obese. He is ill-appearing. He is not toxic-appearing or diaphoretic.  HENT:     Nose: Nose normal.     Mouth/Throat:     Mouth: Mucous membranes are moist.     Pharynx: No oropharyngeal exudate or posterior oropharyngeal erythema.  Eyes:     General: No scleral icterus.    Conjunctiva/sclera: Conjunctivae normal.  Cardiovascular:     Rate and Rhythm: Normal rate and regular rhythm.     Heart sounds: No murmur heard.      Comments: EKG - NSR, 88 bpm Normal EKG Pulmonary:     Effort: Tachypnea and accessory muscle usage present. No respiratory distress.     Breath sounds: No stridor or decreased air movement. Examination of the right-upper field reveals rhonchi. Examination of the left-upper field reveals rhonchi. Examination of the right-middle field reveals rhonchi. Examination of the left-middle field reveals rhonchi. Examination of the right-lower field reveals rhonchi. Examination of the left-lower field reveals rhonchi. Rhonchi present. No decreased breath sounds, wheezing or rales.     Comments: ++ diffuse exp rhonchi Abdominal:     General: Abdomen is protuberant. Bowel sounds are normal. There is no distension.     Palpations: Abdomen is soft. There is no hepatomegaly, splenomegaly or mass.     Tenderness: There is no abdominal tenderness.     Hernia: No hernia is present. There is no hernia in the left inguinal area or right inguinal area.  Genitourinary:    Penis: Normal and circumcised. No discharge, swelling or lesions.      Testes: Normal.     Epididymis:     Right: Normal.     Left: Normal.     Prostate: Enlarged (1+ smooth symm BPH). Not tender and no nodules present.     Rectum: Normal. Guaiac result negative. No mass, tenderness, anal fissure, external hemorrhoid  or internal hemorrhoid. Normal anal tone.  Musculoskeletal:     Cervical back: Neck supple.     Right lower leg: No edema.     Left lower leg: No edema.  Lymphadenopathy:     Cervical: No cervical adenopathy.     Lower Body: No right inguinal adenopathy. No left inguinal adenopathy.  Skin:    General: Skin is warm and dry.  Neurological:     General: No focal deficit present.     Mental Status: He is oriented to person, place, and time. Mental status is at baseline.     Lab Results  Component Value Date   WBC 13.1 (H) 03/08/2020   HGB 14.9 03/08/2020   HCT 47.5 03/08/2020   PLT 222 03/08/2020   GLUCOSE 272 (H) 03/08/2020   CHOL 219 (H) 03/08/2020   TRIG 155 (  H) 03/08/2020   HDL 40 03/08/2020   LDLCALC 150 (H) 03/08/2020   ALT 31 03/08/2020   AST 24 03/08/2020   NA 137 03/08/2020   K 4.2 03/08/2020   CL 99 03/08/2020   CREATININE 1.39 (H) 03/08/2020   BUN 24 03/08/2020   CO2 26 03/08/2020   TSH 0.79 03/08/2020   PSA 4.6 03/08/2020   HGBA1C 9.0 (A) 03/08/2020   MICROALBUR 2.5 03/08/2020    CT CHEST W CONTRAST  Result Date: 01/04/2017 CLINICAL DATA:  Follow-up right upper lobe lung mass. History of COPD. No known tissue sampling of the mass . EXAM: CT CHEST WITH CONTRAST TECHNIQUE: Multidetector CT imaging of the chest was performed during intravenous contrast administration. CONTRAST:  42mL ISOVUE-300 IOPAMIDOL (ISOVUE-300) INJECTION 61% COMPARISON:  02/27/2016 chest radiograph. 03/22/2014 PET-CT. 02/18/2014 chest CT. FINDINGS: Cardiovascular: Normal heart size. No significant pericardial fluid/thickening. Left anterior descending coronary atherosclerosis. Atherosclerotic nonaneurysmal thoracic aorta. Normal caliber pulmonary arteries. No central pulmonary emboli. Mediastinum/Nodes: No discrete thyroid nodules. Unremarkable esophagus. No pathologically enlarged axillary, mediastinal or hilar lymph nodes. Lungs/Pleura: No pneumothorax. No pleural effusion. Moderate to severe  centrilobular emphysema with mild diffuse bronchial wall thickening. Irregular 3.7 x 2.7 cm right upper lobe focus of consolidation (series 3/ image 43), decreased from 4.6 x 3.1 cm on 02/18/2014 chest CT. Nodular subpleural 1.4 x 1.3 cm opacity in the medial right upper lobe (series 3/ image 47) is decreased from 1.8 x 1.6 cm on 02/18/2014. Stable solid subpleural 5 mm pulmonary nodule associated with the minor fissure (series 3/ image 72), considered benign. No acute consolidative airspace disease or new significant pulmonary nodules. Upper abdomen: Hypodense 2.4 cm medial splenic focus (series 2/ image 150), stable since 02/18/2014, suggesting a benign lesion. Stable appearance of the adrenal glands with irregular mild bilateral adrenal thickening, without discrete adrenal nodules, suggesting adrenal hyperplasia. Musculoskeletal: No aggressive appearing focal osseous lesions. Moderate thoracic spondylosis. IMPRESSION: 1. Irregular 3.7 cm masslike focus of consolidation in the right upper lobe, decreased from 4.6 cm on 02/18/2014 chest CT. Separate smaller subpleural nodular opacity in the medial right upper lobe, also decreased. These findings are most suggestive of foci of postinfectious/postinflammatory scarring, although continued chest CT surveillance is warranted in 1 year. 2. No acute pulmonary disease. No thoracic adenopathy. 3. Moderate to severe emphysema with mild diffuse bronchial wall thickening, compatible with the provided history of COPD. 4. Aortic atherosclerosis.  One vessel coronary atherosclerosis. Electronically Signed   By: Ilona Sorrel M.D.   On: 01/04/2017 15:01    Assessment & Plan:   Nas was seen today for annual exam.  Diagnoses and all orders for this visit:  Benign prostatic hyperplasia without lower urinary tract symptoms- His PSA is not rising which is a reassuring sign that he does not have prostate cancer. -     PSA, total and free; Future -     PSA, total and  free  Diabetes mellitus type 2 with complications ([place])- His A1c is up to 9.0% and he has symptoms of glucose toxicity.  I recommended that he restart Metformin and to start using basal insulin and a GLP-1 agonist.  He was informed to start giving himself 20 units of Soliqua each day and to increase by 2 units every 3 days until either he reaches his blood sugar goal or hits the 60 units a day limit. -     BASIC METABOLIC PANEL WITH GFR; Future -     Continuous Blood Gluc Sensor (FREESTYLE LIBRE  [place]) MISC; 1 Act by Does not apply route daily. -     Continuous Blood Gluc Receiver (FREESTYLE LIBRE 14 DAY READER) DEVI; 1 Act by Does not apply route daily. -     Amb Referral to Nutrition and Diabetic E -     Insulin Pen Needle (NOVOFINE) 32G X 6 MM MISC; 1 Act by Does not apply route daily. -     BASIC METABOLIC PANEL WITH GFR -     POCT Glucose (Device for Home Use) -     POCT glycosylated hemoglobin (Hb A1C) -     metFORMIN (GLUCOPHAGE-XR) 750 MG 24 hr tablet; Take 2 tablets (1,500 mg total) by mouth daily with breakfast.  Panlobular emphysema ([place])- He has worsening symptoms and continues to vape.  I do not think he would benefit from a handheld inhaler.  He also has hypoxemia.  I recommended that he upgrade to nebulized medications and to start using continuous oxygen. -     Ambulatory referral to [place] -     arformoterol (BROVANA) 15 MCG/2ML NEBU; Take 2 mLs (15 mcg total) by nebulization in the morning and at bedtime. -     revefenacin (YUPELRI) 175 MCG/3ML nebulizer solution; Take 3 mLs (175 mcg total) by nebulization daily. -     For home use only DME oxygen  Essential hypertension- His blood pressure is not adequately well controlled.  I recommended that he restart amlodipine and losartan. -     CBC with Differential/Platelet; Future -     TSH; Future -     Urinalysis, Routine w reflex microscopic; Future -     Urinalysis, Routine w reflex microscopic -     TSH -     CBC  with Differential/Platelet -     EKG 12-Lead -     amLODipine (NORVASC) 5 MG tablet; Take 1 tablet (5 mg total) by mouth daily. -     losartan (COZAAR) 100 MG tablet; Take 1 tablet (100 mg total) by mouth daily.  Type 2 diabetes mellitus with complication, without long-term current use of insulin ([place])- See above. -     BASIC METABOLIC PANEL WITH GFR; Future -     Microalbumin / creatinine urine ratio; Future -     Cancel: Hemoglobin A1c; Future -     HM Diabetes Foot Exam -     Insulin Glargine-Lixisenatide (SOLIQUA) 100-33 UNT-MCG/ML SOPN; Inject 20 Units into the skin daily. -     Continuous Blood Gluc Sensor (FREESTYLE LIBRE 14 DAY SENSOR) MISC; 1 Act by Does not apply route daily. -     Continuous Blood Gluc Receiver (FREESTYLE LIBRE 14 DAY READER) DEVI; 1 Act by Does not apply route daily. -     Amb Referral to Nutrition and Diabetic E -     Insulin Pen Needle (NOVOFINE) 32G X 6 MM MISC; 1 Act by Does not apply route daily. -     Microalbumin / creatinine urine ratio -     BASIC METABOLIC PANEL WITH GFR -     metFORMIN (GLUCOPHAGE-XR) 750 MG 24 hr tablet; Take 2 tablets (1,500 mg total) by mouth daily with breakfast. -     losartan (COZAAR) 100 MG tablet; Take 1 tablet (100 mg total) by mouth daily.  Hyperlipidemia with target LDL less than 100- He has not achieved his LDL goal.  I have asked him to restart the statin. -     Lipid panel; Future -  Hepatic function panel; Future -     pravastatin (PRAVACHOL) 40 MG tablet; Take 1 tablet (40 mg total) by mouth daily. -     Hepatic function panel -     Lipid panel  Idiopathic gout, unspecified chronicity, unspecified site- His uric acid level is high.  I have asked him to restart allopurinol. -     Uric acid; Future -     Uric acid -     allopurinol (ZYLOPRIM) 100 MG tablet; Take 1 tablet (100 mg total) by mouth daily.  PSA elevation- See above. -     PSA, total and free; Future -     PSA, total and free  Vitamin D  deficiency -     VITAMIN D 25 Hydroxy (Vit-D Deficiency, Fractures); Future -     VITAMIN D 25 Hydroxy (Vit-D Deficiency, Fractures) -     Cholecalciferol (VITAMIN D) 50 MCG (2000 UT) tablet; Take 1 tablet (2,000 Units total) by mouth daily.  Routine general medical examination at a health care facility- Exam completed, labs reviewed, vaccines reviewed and updated, cancer screenings are up-to-date, patient education material was given.  Need for Tdap vaccination -     Tdap vaccine greater than or equal to 7yo IM  Stage 3a chronic kidney disease- His renal function has declined.  Will try to get better control of his blood pressure and his blood sugar.  He agrees to avoid nephrotoxic agents.  Snoring -     Ambulatory referral to Sleep Studies  Other orders -     MICROSCOPIC MESSAGE   I have discontinued Caster Eaves's terazosin, dapagliflozin propanediol, Fluticasone-Umeclidin-Vilant, indapamide, and colchicine. I have also changed his pravastatin, metFORMIN, amLODipine, and Vitamin D. Additionally, I am having him start on arformoterol, revefenacin, Soliqua, NovoFine, and allopurinol. Lastly, I am having him maintain his albuterol, latanoprost, FreeStyle Libre 14 Day Sensor, [employer] 14 Day Reader, and losartan.  Meds ordered this encounter  Medications  . arformoterol (BROVANA) 15 MCG/2ML NEBU    Sig: Take 2 mLs (15 mcg total) by nebulization in the morning and at bedtime.    Dispense:  360 mL    Refill:  1  . revefenacin (YUPELRI) 175 MCG/3ML nebulizer solution    Sig: Take 3 mLs (175 mcg total) by nebulization daily.    Dispense:  270 mL    Refill:  1  . Insulin Glargine-Lixisenatide (SOLIQUA) 100-33 UNT-MCG/ML SOPN    Sig: Inject 20 Units into the skin daily.    Dispense:  6 mL    Refill:  0  . pravastatin (PRAVACHOL) 40 MG tablet    Sig: Take 1 tablet (40 mg total) by mouth daily.    Dispense:  90 tablet    Refill:  1  . Continuous Blood Gluc Sensor (FREESTYLE LIBRE  14 DAY SENSOR) MISC    Sig: 1 Act by Does not apply route daily.    Dispense:  2 each    Refill:  5  . Continuous Blood Gluc Receiver (FREESTYLE LIBRE 14 DAY READER) DEVI    Sig: 1 Act by Does not apply route daily.    Dispense:  2 each    Refill:  5  . Insulin Pen Needle (NOVOFINE) 32G X 6 MM MISC    Sig: 1 Act by Does not apply route daily.    Dispense:  100 each    Refill:  1  . metFORMIN (GLUCOPHAGE-XR) 750 MG 24 hr tablet    Sig: Take 2 tablets (  1,500 mg total) by mouth daily with breakfast.    Dispense:  180 tablet    Refill:  0  . amLODipine (NORVASC) 5 MG tablet    Sig: Take 1 tablet (5 mg total) by mouth daily.    Dispense:  90 tablet    Refill:  1  . Cholecalciferol (VITAMIN D) 50 MCG (2000 UT) tablet    Sig: Take 1 tablet (2,000 Units total) by mouth daily.    Dispense:  90 tablet    Refill:  1  . losartan (COZAAR) 100 MG tablet    Sig: Take 1 tablet (100 mg total) by mouth daily.    Dispense:  90 tablet    Refill:  1  . allopurinol (ZYLOPRIM) 100 MG tablet    Sig: Take 1 tablet (100 mg total) by mouth daily.    Dispense:  90 tablet    Refill:  0   In addition to time spent on CPE, I spent 60 minutes in preparing to see the patient by review of recent labs, imaging and procedures, obtaining and reviewing separately obtained history, communicating with the patient and family or caregiver, ordering medications, tests or procedures, and documenting clinical information in the EHR including the differential Dx, treatment, and any further evaluation and other management of 1. Diabetes mellitus type 2 with complications ([place]) 2. Panlobular emphysema ([place]) 3. Essential hypertension 4. Type 2 diabetes mellitus with complication, without long-term current use of insulin ([place]) 5. Hyperlipidemia with target LDL less than 100 6. Benign prostatic hyperplasia without lower urinary tract symptoms 7. Idiopathic gout, unspecified chronicity, unspecified site 8. PSA elevation 9.  Vitamin D deficiency 10. Stage 3a chronic kidney disease 11. Snoring     Follow-up: Return in about 3 months (around 06/08/2020).  Scarlette Calico, MD

## 2020-03-09 ENCOUNTER — Encounter: Payer: Self-pay | Admitting: Internal Medicine

## 2020-03-09 DIAGNOSIS — N1831 Chronic kidney disease, stage 3a: Secondary | ICD-10-CM | POA: Insufficient documentation

## 2020-03-09 DIAGNOSIS — Z23 Encounter for immunization: Secondary | ICD-10-CM | POA: Insufficient documentation

## 2020-03-09 LAB — LIPID PANEL
Cholesterol: 219 mg/dL — ABNORMAL HIGH (ref <200)
HDL: 40 mg/dL (ref >=40)
LDL Cholesterol (Calc): 150 mg/dL (calc) — ABNORMAL HIGH
Non-HDL Cholesterol (Calc): 179 mg/dL (calc) — ABNORMAL HIGH (ref <130)
Total CHOL/HDL Ratio: 5.5 (calc) — ABNORMAL HIGH (ref <5.0)
Triglycerides: 155 mg/dL — ABNORMAL HIGH (ref <150)

## 2020-03-09 LAB — CBC WITH DIFFERENTIAL/PLATELET
Absolute Monocytes: 917 cells/uL (ref 200–950)
Basophils Absolute: 39 cells/uL (ref 0–200)
Basophils Relative: 0.3 %
Eosinophils Absolute: 131 cells/uL (ref 15–500)
Eosinophils Relative: 1 %
HCT: 47.5 % (ref 38.5–50.0)
Hemoglobin: 14.9 g/dL (ref 13.2–17.1)
Lymphs Abs: 2044 cells/uL (ref 850–3900)
MCH: 26.7 pg — ABNORMAL LOW (ref 27.0–33.0)
MCHC: 31.4 g/dL — ABNORMAL LOW (ref 32.0–36.0)
MCV: 85 fL (ref 80.0–100.0)
MPV: 13.2 fL — ABNORMAL HIGH (ref 7.5–12.5)
Monocytes Relative: 7 %
Neutro Abs: 9969 cells/uL — ABNORMAL HIGH (ref 1500–7800)
Neutrophils Relative %: 76.1 %
Platelets: 222 10*3/uL (ref 140–400)
RBC: 5.59 10*6/uL (ref 4.20–5.80)
RDW: 13.7 % (ref 11.0–15.0)
Total Lymphocyte: 15.6 %
WBC: 13.1 10*3/uL — ABNORMAL HIGH (ref 3.8–10.8)

## 2020-03-09 LAB — URINALYSIS, ROUTINE W REFLEX MICROSCOPIC
Bacteria, UA: NONE SEEN /HPF
Bilirubin Urine: NEGATIVE
Hgb urine dipstick: NEGATIVE
Hyaline Cast: NONE SEEN /LPF
Ketones, ur: NEGATIVE
Nitrite: NEGATIVE
Protein, ur: NEGATIVE
RBC / HPF: NONE SEEN /HPF (ref 0–2)
Specific Gravity, Urine: 1.021 (ref 1.001–1.03)
Squamous Epithelial / HPF: NONE SEEN /HPF (ref <=5)
pH: <=5 (ref 5.0–8.0)

## 2020-03-09 LAB — BASIC METABOLIC PANEL WITH GFR
BUN/Creatinine Ratio: 17 (calc) (ref 6–22)
BUN: 24 mg/dL (ref 7–25)
CO2: 26 mmol/L (ref 20–32)
Calcium: 10.3 mg/dL (ref 8.6–10.3)
Chloride: 99 mmol/L (ref 98–110)
Creat: 1.39 mg/dL — ABNORMAL HIGH (ref 0.70–1.18)
GFR, Est African American: 57 mL/min/{1.73_m2} — ABNORMAL LOW (ref >=60)
GFR, Est Non African American: 50 mL/min/{1.73_m2} — ABNORMAL LOW (ref >=60)
Glucose, Bld: 272 mg/dL — ABNORMAL HIGH (ref 65–99)
Potassium: 4.2 mmol/L (ref 3.5–5.3)
Sodium: 137 mmol/L (ref 135–146)

## 2020-03-09 LAB — VITAMIN D 25 HYDROXY (VIT D DEFICIENCY, FRACTURES): Vit D, 25-Hydroxy: 22 ng/mL — ABNORMAL LOW (ref 30–100)

## 2020-03-09 LAB — HEPATIC FUNCTION PANEL
AG Ratio: 1.5 (calc) (ref 1.0–2.5)
ALT: 31 U/L (ref 9–46)
AST: 24 U/L (ref 10–35)
Albumin: 4.3 g/dL (ref 3.6–5.1)
Alkaline phosphatase (APISO): 61 U/L (ref 35–144)
Bilirubin, Direct: 0.1 mg/dL (ref 0.0–0.2)
Globulin: 2.9 g/dL (calc) (ref 1.9–3.7)
Indirect Bilirubin: 0.5 mg/dL (calc) (ref 0.2–1.2)
Total Bilirubin: 0.6 mg/dL (ref 0.2–1.2)
Total Protein: 7.2 g/dL (ref 6.1–8.1)

## 2020-03-09 LAB — TSH: TSH: 0.79 mIU/L (ref 0.40–4.50)

## 2020-03-09 LAB — PSA, TOTAL AND FREE
PSA, % Free: 35 % (calc) (ref >25)
PSA, Free: 1.6 ng/mL
PSA, Total: 4.6 ng/mL — ABNORMAL HIGH (ref <=4.0)

## 2020-03-09 LAB — MICROALBUMIN / CREATININE URINE RATIO
Creatinine, Urine: 174 mg/dL (ref 20–320)
Microalb Creat Ratio: 14 mcg/mg creat (ref <30)
Microalb, Ur: 2.5 mg/dL

## 2020-03-09 LAB — URIC ACID: Uric Acid, Serum: 11.8 mg/dL — ABNORMAL HIGH (ref 4.0–8.0)

## 2020-03-09 MED ORDER — METFORMIN HCL ER 750 MG PO TB24: 1500.0000 mg | ORAL_TABLET | Freq: Every day | ORAL | 0 refills | Status: DC

## 2020-03-09 MED ORDER — VITAMIN D 50 MCG (2000 UT) PO TABS: 2000.0000 [IU] | ORAL_TABLET | Freq: Every day | ORAL | 1 refills | Status: AC

## 2020-03-09 MED ORDER — LOSARTAN POTASSIUM 100 MG PO TABS: 100.0000 mg | ORAL_TABLET | Freq: Every day | ORAL | 1 refills | Status: DC

## 2020-03-09 MED ORDER — AMLODIPINE BESYLATE 5 MG PO TABS: 5.0000 mg | ORAL_TABLET | Freq: Every day | ORAL | 1 refills | Status: AC

## 2020-03-09 MED ORDER — ALLOPURINOL 100 MG PO TABS: 100.0000 mg | ORAL_TABLET | Freq: Every day | ORAL | 0 refills | Status: DC

## 2020-03-09 NOTE — Progress Notes (Signed)
Patient O2 at rest in the office was 90% on room air.   Patient at start of 6 minute walk test dropped to 88% within the 1 minute mark. Pulse rate was at 105 bpm.   With 1.5 L of continuous oxygen, patient was able to maintain 91% with mild exertion.

## 2020-03-10 ENCOUNTER — Other Ambulatory Visit: Payer: Self-pay | Admitting: Internal Medicine

## 2020-03-10 DIAGNOSIS — J431 Panlobular emphysema: Secondary | ICD-10-CM

## 2020-03-10 DIAGNOSIS — R918 Other nonspecific abnormal finding of lung field: Secondary | ICD-10-CM

## 2020-03-10 DIAGNOSIS — R0683 Snoring: Secondary | ICD-10-CM

## 2020-03-14 ENCOUNTER — Telehealth: Payer: Self-pay | Admitting: Internal Medicine

## 2020-03-14 NOTE — Telephone Encounter (Signed)
Patient called and was wondering if Stefannie could give him a call back regarding an insulin pen. He said that he was not sure on how to use it since he did not have a meter to read his blood sugar. The insulin pen is [place].

## 2020-03-15 NOTE — Telephone Encounter (Signed)
Pt contacted. Pt stated that he stated that he as been injecting insulin every other day and that he is not sure if it is working, pt stated that he left the insulin in the car and it got really warm.   Pt will call the rx payer for his plan and find out what glucometer is covered and let me know.

## 2020-03-15 NOTE — Telephone Encounter (Signed)
F/u   The patient is calling back   Glucometer is one touch ultra mini & Velcro IQ   The patient has [employer]

## 2020-03-16 MED ORDER — ONETOUCH ULTRASOFT LANCETS MISC: 12 refills | Status: AC

## 2020-03-16 MED ORDER — ONETOUCH ULTRA MINI W/DEVICE KIT: PACK | 0 refills | Status: AC

## 2020-03-16 MED ORDER — GLUCOSE BLOOD VI STRP: ORAL_STRIP | 3 refills | Status: AC

## 2020-03-16 NOTE — Telephone Encounter (Signed)
Pt called back. I informed that I am working on it now.    E[facility] has [facility] [place].

## 2020-04-14 ENCOUNTER — Telehealth: Payer: Self-pay | Admitting: Internal Medicine

## 2020-04-14 NOTE — Telephone Encounter (Signed)
I have spoke to [facility] and informed him of PCPs response from phone encounter in July. He states that the leg massager is being prequested by the patient. So he will refax the forms but I did inform him that it is possible that PCP will deny the request again. He expressed understanding.

## 2020-04-14 NOTE — Telephone Encounter (Signed)
Pt called in regards to a previous convo from July with Stefannie about a leg massager from [employer] He provided a contact number for [employer] [phone].

## 2020-04-14 NOTE — Telephone Encounter (Signed)
Patient is calling requesting a call back from [place]. Informed patient she is no longer here. He states then he would like Dr.Jones new assistance to give him a call back. He wants to discuss with assistance because he states that is how he always did it with Stefannie.  The only information I got from patient was it is about a medication.

## 2020-04-25 NOTE — Telephone Encounter (Signed)
Jesse Garrison is calling to follow up and requesting a call back at : [phone]

## 2020-04-27 NOTE — Telephone Encounter (Signed)
Antonio is calling again, requesting a call back.

## 2020-04-27 NOTE — Telephone Encounter (Signed)
Still waiting for forms he was sending over. No update until those can be provided and given to PCP for advisement.

## 2020-05-06 NOTE — Telephone Encounter (Signed)
   [facility] - Antonio calling to request status of forms. Advised caller to resend fax. He became aggressive and upset and disconnected call

## 2020-05-12 ENCOUNTER — Encounter: Payer: Self-pay | Admitting: Dietician

## 2020-05-12 ENCOUNTER — Other Ambulatory Visit: Payer: Self-pay

## 2020-05-12 ENCOUNTER — Encounter: Payer: Medicare (Managed Care) | Attending: Internal Medicine | Admitting: Dietician

## 2020-05-12 DIAGNOSIS — E118 Type 2 diabetes mellitus with unspecified complications: Secondary | ICD-10-CM | POA: Diagnosis present

## 2020-05-12 NOTE — Patient Instructions (Addendum)
Contact your doctor about your insulin prescription, and proper dosing information.  [facility] about [facility] prescription.  Take your metFormin twice daily AFTER A MEAL, on a full stomach.  Check your blood sugar first thing in the morning. Remember to clean your finger with alcohol, or soap and water before testing. Prick the side of your finger. Drop the meter and strip down into the blood drop.  Inject your insulin in a different spot of your abdomen every day. Rotate your injection sites. Inject your insulin, leave the needle in for 10 seconds before removing it.  Eat 3 meals a day, about 5 hours apart!  Drink at least 64 oz of water a day, and limit fruit juices!

## 2020-05-12 NOTE — Progress Notes (Signed)
Diabetes Self-Management Education  Visit Type: First/Initial  Appt. Start Time: 2:45 Appt. End Time: 4:00  05/12/2020  Mr. Jesse Garrison, identified by name and date of birth, is a 74 y.o. male with a diagnosis of Diabetes: Type 2.   ASSESSMENT  Pt brought his blood glucose log and meter to our appointment. Reports his meter has read "ERROR" the last few times he has checked.  Instruct pt on how to check blood sugar with his meter. Pt BG reading during appointment is 217 mg/dL. Pt reports injecting SOLIQUA once daily around noon, but not rotating injection sites. Pt reports changing his dietary habits once starting insulin, around the beginning of August. Pt has not restarted metFormin, reports he has experienced diarrhea the past two weeks that has kept him from wanting to take it. Pt reports getting pedicures, and informs the pedicurists he is diabetic. 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 Pt is using a walker due to getting winded quickly from COPD. Former smoker of 35 years. Reports gaining 100 pounds since he quit smoking in 2013. Pt does not eat 3 meals a day, reports eating when he feels like it. May only eat once a day.  There were no vitals taken for this visit. There is no height or weight on file to calculate BMI.   Diabetes Self-Management Education - 05/12/20 1532      Visit Information   Visit Type First/Initial      Initial Visit   Diabetes Type Type 2    Are you currently following a meal plan? No   Pt was not taking metFormin   Are you taking your medications as prescribed? No      Health Coping   How would you rate your overall health? Fair      Psychosocial Assessment   Patient Belief/Attitude about Diabetes Motivated to manage diabetes    Self-care barriers Unable to determine;Other (comment)    Self-management support Doctor's office    Other persons present Patient    Patient Concerns Nutrition/Meal planning;Medication;Monitoring    Special Needs  Simplified materials    Preferred Learning Style Hands on;Visual    Learning Readiness Ready    How often do you need to have someone help you when you read instructions, pamphlets, or other written materials from your doctor or pharmacy? 3 - Sometimes    What is the last grade level you completed in school? 12th grade      Pre-Education Assessment   Patient understands the diabetes disease and treatment process. Needs Instruction    Patient understands incorporating nutritional management into lifestyle. Needs Instruction    Patient undertands incorporating physical activity into lifestyle. Needs Instruction    Patient understands using medications safely. Needs Instruction    Patient understands monitoring blood glucose, interpreting and using results Needs Instruction    Patient understands prevention, detection, and treatment of acute complications. Needs Instruction    Patient understands prevention, detection, and treatment of chronic complications. Needs Instruction    Patient understands how to develop strategies to address psychosocial issues. Needs Instruction    Patient understands how to develop strategies to promote health/change behavior. Needs Instruction      Complications   Last HgB A1C per patient/outside source 9 %   03/08/2020   How often do you check your blood sugar? 1-2 times/day    Fasting Blood glucose range (mg/dL) 130-179    Postprandial Blood glucose range (mg/dL) 180-200    Have you had a dilated eye  exam in the past 12 months? Yes    Have you had a dental exam in the past 12 months? Yes    Are you checking your feet? Yes    How many days per week are you checking your feet? 3      Dietary Intake   Breakfast none    Snack (morning) none    Lunch none    Snack (afternoon) none    Dinner [employer] and sausage pizza, 4-5 buffalo wings, bud light    Snack (evening) none    Beverage(s) bud light      Exercise   Exercise Type ADL's    How many  days per week to you exercise? 0    How many minutes per day do you exercise? 0    Total minutes per week of exercise 0      Patient Education   Previous Diabetes Education No    Disease state  Factors that contribute to the development of diabetes;Definition of diabetes, type 1 and 2, and the diagnosis of diabetes    Nutrition management  Role of diet in the treatment of diabetes and the relationship between the three main macronutrients and blood glucose level;Meal timing in regards to the patients' current diabetes medication.    Medications Taught/reviewed insulin injection, site rotation, insulin storage and needle disposal.;Reviewed patients medication for diabetes, action, purpose, timing of dose and side effects.    Monitoring Taught/evaluated SMBG meter.;Purpose and frequency of SMBG.;Taught/discussed recording of test results and interpretation of SMBG.;Identified appropriate SMBG and/or A1C goals.;Daily foot exams    Acute complications Discussed and identified patients' treatment of hyperglycemia.    Chronic complications Dental care;Retinopathy and reason for yearly dilated eye exams;Identified and discussed with patient  current chronic complications    Psychosocial adjustment Role of stress on diabetes;Identified and addressed patients feelings and concerns about diabetes    Personal strategies to promote health Review risk of smoking and offered smoking cessation;Lifestyle issues that need to be addressed for better diabetes care      Individualized Goals (developed by patient)   Nutrition Follow meal plan discussed    Physical Activity Not Applicable    Medications take my medication as prescribed    Monitoring  test my blood glucose as discussed      Post-Education Assessment   Patient understands the diabetes disease and treatment process. Needs Review    Patient understands incorporating nutritional management into lifestyle. Needs Review    Patient undertands  incorporating physical activity into lifestyle. Needs Review    Patient understands using medications safely. Needs Review    Patient understands monitoring blood glucose, interpreting and using results Needs Review    Patient understands prevention, detection, and treatment of acute complications. Needs Review    Patient understands prevention, detection, and treatment of chronic complications. Needs Review    Patient understands how to develop strategies to address psychosocial issues. Needs Review    Patient understands how to develop strategies to promote health/change behavior. Needs Review      Outcomes   Expected Outcomes Demonstrated limited interest in learning.  Expect minimal changes    Future DMSE 4-6 wks    Program Status Not Completed           Individualized Plan for Diabetes Self-Management Training:   Learning Objective:  Patient will have a greater understanding of diabetes self-management. Patient education plan is to attend individual and/or group sessions per assessed needs and concerns.   Plan:  Patient Instructions  Contact your doctor about your insulin prescription, and proper dosing information.  Check with [facility] about your [facility] prescription.  Take your metFormin twice daily AFTER A MEAL, on a full stomach.  Check your blood sugar first thing in the morning. Remember to clean your finger with alcohol, or soap and water before testing. Prick the side of your finger. Drop the meter and strip down into the blood drop.  Inject your insulin in a different spot of your abdomen every day. Rotate your injection sites. Inject your insulin, leave the needle in for 10 seconds before removing it.  Eat 3 meals a day, about 5 hours apart!  Drink at least 64 oz of water a day, and limit fruit juices!   Expected Outcomes:  Demonstrated limited interest in learning.  Expect minimal changes  Education material provided: My Plate  If problems or  questions, patient to contact team via:  Phone and Email  Future DSME appointment: 4-6 wks

## 2020-05-13 ENCOUNTER — Telehealth: Payer: Self-pay | Admitting: Emergency Medicine

## 2020-05-13 NOTE — Telephone Encounter (Signed)
Antonio from [facility] called and stated he has not received a fax back that he spoke to you Shirron about. He is going to refax the form over. Thanks.

## 2020-05-14 ENCOUNTER — Other Ambulatory Visit: Payer: Self-pay | Admitting: Internal Medicine

## 2020-05-16 ENCOUNTER — Ambulatory Visit (INDEPENDENT_AMBULATORY_CARE_PROVIDER_SITE_OTHER): Payer: Medicare (Managed Care) | Admitting: Pulmonary Disease

## 2020-05-16 ENCOUNTER — Encounter: Payer: Self-pay | Admitting: Pulmonary Disease

## 2020-05-16 ENCOUNTER — Other Ambulatory Visit: Payer: Self-pay | Admitting: Pulmonary Disease

## 2020-05-16 ENCOUNTER — Ambulatory Visit (INDEPENDENT_AMBULATORY_CARE_PROVIDER_SITE_OTHER): Payer: Medicare (Managed Care)

## 2020-05-16 ENCOUNTER — Other Ambulatory Visit: Payer: Self-pay

## 2020-05-16 VITALS — BP 130/72 | HR 93 | Temp 97.7°F | Ht 69.0 in | Wt 261.6 lb

## 2020-05-16 DIAGNOSIS — R918 Other nonspecific abnormal finding of lung field: Secondary | ICD-10-CM

## 2020-05-16 DIAGNOSIS — J4489 Other specified chronic obstructive pulmonary disease: Secondary | ICD-10-CM

## 2020-05-16 DIAGNOSIS — J9611 Chronic respiratory failure with hypoxia: Secondary | ICD-10-CM | POA: Diagnosis not present

## 2020-05-16 DIAGNOSIS — Z23 Encounter for immunization: Secondary | ICD-10-CM

## 2020-05-16 DIAGNOSIS — J449 Chronic obstructive pulmonary disease, unspecified: Secondary | ICD-10-CM | POA: Diagnosis not present

## 2020-05-16 MED ORDER — ALBUTEROL SULFATE HFA 108 (90 BASE) MCG/ACT IN AERS: 2.0000 | INHALATION_SPRAY | Freq: Four times a day (QID) | RESPIRATORY_TRACT | 5 refills | Status: DC | PRN

## 2020-05-16 MED ORDER — ANORO ELLIPTA 62.5-25 MCG/INH IN AEPB: 1.0000 | INHALATION_SPRAY | Freq: Every day | RESPIRATORY_TRACT | 5 refills | Status: AC

## 2020-05-16 NOTE — Progress Notes (Signed)
[facility]  Chief Complaint  Patient presents with  . Consult    Lung mass, COPD, hypoxia    Constitutional:  BP 130/72 (BP Location: Left Arm, Cuff Size: Normal)   Pulse 93   Temp 97.7 F (36.5 C) (Oral)   Ht 5\' 9"  (1.753 m)   Wt 261 lb 9.6 oz (118.7 kg)   SpO2 93%   BMI 38.63 kg/m   Past Medical History:  HTN, HLD, BPH, DM  Past Surgical History:  His  has a past surgical history that includes fatty tissue removal (1996).  Brief Summary:  Jesse Garrison is a 74 y.o. male smoker with lung mass, COPD/emphysema, and chronic respiratory failure wit hypoxia.      Subjective:   I last saw him in July 2017.  At that time chest xray showed decrease in size of RUL lung mass.  CT chest from May 2018 showed further decrease in size of mass.  He was seen March 08, 2020 by PCP.  SpO2 with ambulation was 88% on room air and recovered to 91% with 1.5 liters supplemental oxygen, and this was set up through [employer].  Walk in office today his SpO2 dropped to 87% on room air after one lap.  Recovered to > 90% with 1.5 liters.  He hasn't been using oxygen at night.  He snores and wakes up in dreams feeling like he is drowning or smothering.  He wakes up several times per night to use the bathroom.  He is sleepy during the day.  He quit smoking tobacco cigarettes in 2013.  He uses electronic cigarettes.  He smokes these a few times per day and says he doesn't inhale.    He has an old albuterol inhaler.  He was using brovana and yupelri for research study, but doesn't have these anymore.  He gets occasional cough and chest congestion.  Albuterol helps when he uses it.  He has to use a walker to help ambulate.  Denies chest pain, fever, hemoptysis, or leg swelling.  Physical Exam:   Appearance - well kempt   ENMT - no sinus tenderness, no oral exudate, no LAN, Mallampati 3 airway, no stridor  Respiratory - decreased breath sounds bilaterally,  no wheezing or rales  CV - s1s2 regular rate and rhythm, no murmurs  Ext - no clubbing, no edema  Skin - no rashes  Psych - normal mood and affect   Pulmonary testing:   PFT 04/03/13 >> FEV1 1.42 (52%), FEV1% 49, TLC 5.78 (87%), DLCO 44%  Chest Imaging:   CT chest 02/18/14 >> centrilobular/ paraseptal emphysema, 4.2 cm RUL mass, 2.3 cm LUL mass  PET scan 03/22/14 >> 5.4 cm RUL mass 9.2 SUV  CT chest 01/04/17 >> mod/severe centrilobular emphysema, 3.7 x 2.7 RUL mass, 1.4 x 1.3 cm opacity medial RUL, 5 mm nodule minor fissure  CT chest 09/13/17 >> 1.6 x 1.5 RUL mass, 6 mm RUL nodule  Sleep Tests:    Social History:  He  reports that he has been smoking e-cigarettes. He has a 50.00 pack-year smoking history. He has never used smokeless tobacco. He reports current alcohol use of about 1.0 standard drink of alcohol per week. He reports that he does not use drugs.  Family History:  His family history includes Diabetes in an other family member; Emphysema in his mother.     Assessment/Plan:   COPD with emphysema. - don't think he needs additional PFT at this time -  discussed natural progression of emphysema - will have him try anoro - refilled albuterol - high dose influenza vaccine today - he received Garvin vaccination   Lung mass. - CT chest from 2019 showed decrease in size of lesions, but he hasn't had any imaging studies since then - will arrange for chest xray today and then determine if he needs repeat CT chest   Chronic respiratory failure with hypoxia. - discussed how hypoxia can impact his health and exercise tolerance - goal SpO2 > 90% - advised him to get a portable pulse oximeter to monitor his oxygen levels at home - continue 1.5 liters oxygen with exertion  Snoring, sleep disruption, apnea, and daytime sleepiness. - I am concerned he could have obstructive sleep apnea, but he isn't certain about this - will arrange for overnight oximetry on room air  and then determine if he needs further sleep testing  Electronic cigarette use.  - discussed importance of avoiding this - he doesn't feel he uses this too much and would prefer to continue for now  Time Spent Involved in Patient Care on Day of Examination:  47 minutes  Follow up:  Patient Instructions  Chest xray today   High dose flu shot today  Anoro one puff daily  Albuterol two puffs every 4 to 6 hours as needed for cough, wheeze, chest congestion or shortness of breath  Uses your supplemental oxygen when you are doing activity  Will arrange for overnight oxygen test  Follow up in 8 weeks   Medication List:   Allergies as of 05/16/2020   No Known Allergies     Medication List       Accurate as of May 16, 2020 12:10 PM. If you have any questions, ask your nurse or doctor.        STOP taking these medications   arformoterol 15 MCG/2ML Nebu Commonly known as: BROVANA Stopped by: Chesley Mires, MD   revefenacin 175 MCG/3ML nebulizer solution Commonly known as: YUPELRI Stopped by: Chesley Mires, MD     TAKE these medications   albuterol 108 (90 Base) MCG/ACT inhaler Commonly known as: VENTOLIN HFA Inhale 2 puffs into the lungs every 6 (six) hours as needed for wheezing or shortness of breath. What changed: reasons to take this Changed by: Chesley Mires, MD   allopurinol 100 MG tablet Commonly known as: ZYLOPRIM Take 1 tablet (100 mg total) by mouth daily.   amLODipine 5 MG tablet Commonly known as: NORVASC Take 1 tablet (5 mg total) by mouth daily.   Anoro Ellipta 62.5-25 MCG/INH Aepb Generic drug: umeclidinium-vilanterol Inhale 1 puff into the lungs daily. Started by: Chesley Mires, MD   FreeStyle Libre 14 Day Reader Kerrin Mo 1 Act by Does not apply route daily.   FreeStyle Libre 14 Day Sensor Misc 1 Act by Does not apply route daily.   glucose blood test strip Use to test blood daily. DX: E11.8 Onetouch Ultra   latanoprost 0.005 % ophthalmic  solution Commonly known as: XALATAN Place 1 drop into both eyes at bedtime.   losartan 100 MG tablet Commonly known as: COZAAR Take 1 tablet (100 mg total) by mouth daily.   metFORMIN 750 MG 24 hr tablet Commonly known as: GLUCOPHAGE-XR Take 2 tablets (1,500 mg total) by mouth daily with breakfast.   NovoFine 32G X 6 MM Misc Generic drug: Insulin Pen Needle 1 Act by Does not apply route daily.   ONE TOUCH ULTRA MINI w/Device Kit Use to test blood daily.  DX: E11.8 Onetouch Ultra   onetouch ultrasoft lancets Use to test blood daily. DX: E11.8 Onetouch Ultra   pravastatin 40 MG tablet Commonly known as: PRAVACHOL Take 1 tablet (40 mg total) by mouth daily.   Soliqua 100-33 UNT-MCG/ML Sopn Generic drug: Insulin Glargine-Lixisenatide Inject 20 Units into the skin daily.   Vitamin D 50 MCG (2000 UT) tablet Take 1 tablet (2,000 Units total) by mouth daily.       Signature:  Chesley Mires, MD [place] Pager - [phone] 05/16/2020, 12:10 PM

## 2020-05-16 NOTE — Telephone Encounter (Signed)
Forms were given to PCP. Once he has reviewed and decided to sign I will fax them back.

## 2020-05-16 NOTE — Patient Instructions (Addendum)
Chest xray today   High dose flu shot today  Anoro one puff daily  Albuterol two puffs every 4 to 6 hours as needed for cough, wheeze, chest congestion or shortness of breath  Uses your supplemental oxygen when you are doing activity  Will arrange for overnight oxygen test  Follow up in 8 weeks

## 2020-05-18 ENCOUNTER — Telehealth: Payer: Self-pay | Admitting: Pulmonary Disease

## 2020-05-18 NOTE — Telephone Encounter (Signed)
DG Chest 2 View  Result Date: 05/16/2020 CLINICAL DATA:  Lung mass. EXAM: CHEST - 2 VIEW COMPARISON:  Most recent chest imaging radiograph 02/27/2016. Chest CT 01/04/2017 FINDINGS: Nodular opacity in the right upper lobe currently measures 2.4 cm, previously 2.3 cm on radiograph. Margins are irregular streaky opacities extending towards the pleura, this is not significantly changed from prior CT allowing for same caliper placement allowing for differences in modality. The additional lung nodules on CT are not well demonstrated by radiograph. Emphysema with underlying bronchial thickening. No evidence is no nodule, consolidation, or focal airspace disease. Stable heart size and mediastinal contours. Aortic atherosclerosis. No pneumothorax, pleural effusion, or pulmonary edema. No acute osseous abnormalities are seen. IMPRESSION: 1. Right upper lobe nodular opacity, unchanged in radiographic appearance from imaging dating back to 2018, typically benign. Additional pulmonary nodules on CT are not well seen by radiograph. 2. Emphysema with underlying bronchial thickening. Electronically Signed   By: Keith Rake M.D.   On: 05/16/2020 20:03    Discussed findings with patient.  Rt upper lobe lesion hasn't changed in several years.  Defer repeat CT chest at this time.

## 2020-05-20 ENCOUNTER — Other Ambulatory Visit: Payer: Self-pay | Admitting: Internal Medicine

## 2020-05-20 DIAGNOSIS — E118 Type 2 diabetes mellitus with unspecified complications: Secondary | ICD-10-CM

## 2020-05-20 MED ORDER — SOLIQUA 100-33 UNT-MCG/ML ~~LOC~~ SOPN: 40.0000 [IU] | PEN_INJECTOR | Freq: Every day | SUBCUTANEOUS | 1 refills | Status: DC

## 2020-05-20 NOTE — Telephone Encounter (Signed)
Jesse Garrison with [facility] called and was wanting an update on the forms. He can be reached at [phone]. When calling ask for [facility].

## 2020-05-31 ENCOUNTER — Other Ambulatory Visit: Payer: Self-pay | Admitting: Internal Medicine

## 2020-05-31 DIAGNOSIS — M1000 Idiopathic gout, unspecified site: Secondary | ICD-10-CM

## 2020-05-31 DIAGNOSIS — E118 Type 2 diabetes mellitus with unspecified complications: Secondary | ICD-10-CM

## 2020-06-02 ENCOUNTER — Encounter: Payer: Self-pay | Admitting: Internal Medicine

## 2020-06-02 ENCOUNTER — Ambulatory Visit (INDEPENDENT_AMBULATORY_CARE_PROVIDER_SITE_OTHER): Payer: Medicare (Managed Care) | Admitting: Internal Medicine

## 2020-06-02 ENCOUNTER — Other Ambulatory Visit: Payer: Self-pay

## 2020-06-02 VITALS — BP 132/86 | HR 89 | Temp 98.3°F | Resp 16 | Ht 69.0 in | Wt 258.0 lb

## 2020-06-02 DIAGNOSIS — E118 Type 2 diabetes mellitus with unspecified complications: Secondary | ICD-10-CM

## 2020-06-02 DIAGNOSIS — H6123 Impacted cerumen, bilateral: Secondary | ICD-10-CM | POA: Diagnosis not present

## 2020-06-02 DIAGNOSIS — I10 Essential (primary) hypertension: Secondary | ICD-10-CM

## 2020-06-02 DIAGNOSIS — Z23 Encounter for immunization: Secondary | ICD-10-CM | POA: Insufficient documentation

## 2020-06-02 MED ORDER — OZEMPIC (0.25 OR 0.5 MG/DOSE) 2 MG/1.5ML ~~LOC~~ SOPN: 0.2500 mg | PEN_INJECTOR | SUBCUTANEOUS | 0 refills | Status: AC

## 2020-06-02 MED ORDER — SHINGRIX 50 MCG/0.5ML IM SUSR: 0.5000 mL | Freq: Once | INTRAMUSCULAR | 1 refills | Status: AC

## 2020-06-02 NOTE — Patient Instructions (Signed)
Type 2 Diabetes Mellitus, Diagnosis, Adult Type 2 diabetes (type 2 diabetes mellitus) is a long-term (chronic) disease. In type 2 diabetes, one or both of these problems may be present:  The pancreas does not make enough of a hormone called insulin.  Cells in the body do not respond properly to insulin that the body makes (insulin resistance). Normally, insulin allows blood sugar (glucose) to enter cells in the body. The cells use glucose for energy. Insulin resistance or lack of insulin causes excess glucose to build up in the blood instead of going into cells. As a result, high blood glucose (hyperglycemia) develops. What increases the risk? The following factors may make you more likely to develop type 2 diabetes:  Having a family member with type 2 diabetes.  Being overweight or obese.  Having an inactive (sedentary) lifestyle.  Having been diagnosed with insulin resistance.  Having a history of prediabetes, gestational diabetes, or polycystic ovary syndrome (PCOS).  Being of American-Indian, African-American, Hispanic/Latino, or Asian/Pacific Islander descent. What are the signs or symptoms? In the early stage of this condition, you may not have symptoms. Symptoms develop slowly and may include:  Increased thirst (polydipsia).  Increased hunger(polyphagia).  Increased urination (polyuria).  Increased urination during the night (nocturia).  Unexplained weight loss.  Frequent infections that keep coming back (recurring).  Fatigue.  Weakness.  Vision changes, such as blurry vision.  Cuts or bruises that are slow to heal.  Tingling or numbness in the hands or feet.  Dark patches on the skin (acanthosis nigricans). How is this diagnosed? This condition is diagnosed based on your symptoms, your medical history, a physical exam, and your blood glucose level. Your blood glucose may be checked with one or more of the following blood tests:  A fasting blood glucose (FBG)  test. You will not be allowed to eat (you will fast) for 8 hours or longer before a blood sample is taken.  A random blood glucose test. This test checks blood glucose at any time of day regardless of when you ate.  An A1c (hemoglobin A1c) blood test. This test provides information about blood glucose control over the previous 2-3 months.  An oral glucose tolerance test (OGTT). This test measures your blood glucose at two times: ? After fasting. This is your baseline blood glucose level. ? Two hours after drinking a beverage that contains glucose. You may be diagnosed with type 2 diabetes if:  Your FBG level is 126 mg/dL (7.0 mmol/L) or higher.  Your random blood glucose level is 200 mg/dL (11.1 mmol/L) or higher.  Your A1c level is 6.5% or higher.  Your OGTT result is higher than 200 mg/dL (11.1 mmol/L). These blood tests may be repeated to confirm your diagnosis. How is this treated? Your treatment may be managed by a specialist called an endocrinologist. Type 2 diabetes may be treated by following instructions from your health care provider about:  Making diet and lifestyle changes. This may include: ? Following an individualized nutrition plan that is developed by a diet and nutrition specialist (registered dietitian). ? Exercising regularly. ? Finding ways to manage stress.  Checking your blood glucose level as often as told.  Taking diabetes medicines or insulin daily. This helps to keep your blood glucose levels in the healthy range. ? If you use insulin, you may need to adjust the dosage depending on how physically active you are and what foods you eat. Your health care provider will tell you how to adjust your dosage.    Taking medicines to help prevent complications from diabetes, such as: ? Aspirin. ? Medicine to lower cholesterol. ? Medicine to control blood pressure. Your health care provider will set individualized treatment goals for you. Your goals will be based on  your age, other medical conditions you have, and how you respond to diabetes treatment. Generally, the goal of treatment is to maintain the following blood glucose levels:  Before meals (preprandial): 80-130 mg/dL (4.4-7.2 mmol/L).  After meals (postprandial): below 180 mg/dL (10 mmol/L).  A1c level: less than 7%. Follow these instructions at home: Questions to ask your health care provider  Consider asking the following questions: ? Do I need to meet with a diabetes educator? ? Where can I find a support group for people with diabetes? ? What equipment will I need to manage my diabetes at home? ? What diabetes medicines do I need, and when should I take them? ? How often do I need to check my blood glucose? ? What number can I call if I have questions? ? When is my next appointment? General instructions  Take over-the-counter and prescription medicines only as told by your health care provider.  Keep all follow-up visits as told by your health care provider. This is important.  For more information about diabetes, visit: ? American Diabetes Association (ADA): www.diabetes.org ? American Association of Diabetes Educators (AADE): www.diabeteseducator.org Contact a health care provider if:  Your blood glucose is at or above 240 mg/dL (13.3 mmol/L) for 2 days in a row.  You have been sick or have had a fever for 2 days or longer, and you are not getting better.  You have any of the following problems for more than 6 hours: ? You cannot eat or drink. ? You have nausea and vomiting. ? You have diarrhea. Get help right away if:  Your blood glucose is lower than 54 mg/dL (3.0 mmol/L).  You become confused or you have trouble thinking clearly.  You have difficulty breathing.  You have moderate or large ketone levels in your urine. Summary  Type 2 diabetes (type 2 diabetes mellitus) is a long-term (chronic) disease. In type 2 diabetes, the pancreas does not make enough of a  hormone called insulin, or cells in the body do not respond properly to insulin that the body makes (insulin resistance).  This condition is treated by making diet and lifestyle changes and taking diabetes medicines or insulin.  Your health care provider will set individualized treatment goals for you. Your goals will be based on your age, other medical conditions you have, and how you respond to diabetes treatment.  Keep all follow-up visits as told by your health care provider. This is important. This information is not intended to replace advice given to you by your health care provider. Make sure you discuss any questions you have with your health care provider. Document Revised: 09/27/2017 Document Reviewed: 09/02/2015 Elsevier Patient Education  2020 [employer]  

## 2020-06-02 NOTE — Progress Notes (Signed)
Subjective:  Patient ID: Jesse Garrison, male    DOB: 02/18/1946  Age: 74 y.o. MRN: [id number]  CC: Diabetes  This visit occurred during the SARS-CoV-2 public health emergency.  Safety protocols were in place, including screening questions prior to the visit, additional usage of staff PPE, and extensive cleaning of exam room while observing appropriate contact time as indicated for disinfecting solutions.    HPI Jesse Garrison presents for f/up - H has used all the [place] samples and tells me he cannot afford to pay for it anymore.  He would like to switch to a different agent.  He tells me his blood sugars are getting better and he denies polys.  He complains of a several week history of decreased hearing in both ears.  He denies ear pain, bleeding, or discharge.  Outpatient Medications Prior to Visit  Medication Sig Dispense Refill  . allopurinol (ZYLOPRIM) 100 MG tablet TAKE 1 TABLET BY MOUTH EVERY DAY 90 tablet 0  . amLODipine (NORVASC) 5 MG tablet Take 1 tablet (5 mg total) by mouth daily. 90 tablet 1  . Blood Glucose Monitoring Suppl (ONE TOUCH ULTRA MINI) w/Device KIT Use to test blood daily. DX: E11.8 Onetouch Ultra 1 kit 0  . Cholecalciferol (VITAMIN D) 50 MCG (2000 UT) tablet Take 1 tablet (2,000 Units total) by mouth daily. 90 tablet 1  . Continuous Blood Gluc Receiver (FREESTYLE LIBRE 14 DAY READER) DEVI 1 Act by Does not apply route daily. 2 each 5  . Continuous Blood Gluc Sensor (FREESTYLE LIBRE 14 DAY SENSOR) MISC 1 Act by Does not apply route daily. 2 each 5  . glucose blood test strip Use to test blood daily. DX: E11.8 Onetouch Ultra 100 each 3  . Insulin Pen Needle (NOVOFINE) 32G X 6 MM MISC 1 Act by Does not apply route daily. 100 each 1  . Lancets (ONETOUCH ULTRASOFT) lancets Use to test blood daily. DX: E11.8 Onetouch Ultra 100 each 12  . latanoprost (XALATAN) 0.005 % ophthalmic solution Place 1 drop into both eyes at bedtime.    . levalbuterol (XOPENEX HFA) 45 MCG/ACT  inhaler Inhale 2 puffs into the lungs every 6 (six) hours as needed for wheezing or shortness of breath. 1 each 5  . losartan (COZAAR) 100 MG tablet Take 1 tablet (100 mg total) by mouth daily. 90 tablet 1  . metFORMIN (GLUCOPHAGE-XR) 750 MG 24 hr tablet TAKE 2 TABLETS (1,500 MG TOTAL) BY MOUTH DAILY WITH BREAKFAST. 180 tablet 0  . pravastatin (PRAVACHOL) 40 MG tablet Take 1 tablet (40 mg total) by mouth daily. 90 tablet 1  . umeclidinium-vilanterol (ANORO ELLIPTA) 62.5-25 MCG/INH AEPB Inhale 1 puff into the lungs daily. 1 each 5  . Insulin Glargine-Lixisenatide (SOLIQUA) 100-33 UNT-MCG/ML SOPN Inject 40 Units into the skin daily. 9 mL 1   No facility-administered medications prior to visit.    ROS Review of Systems  Constitutional: Negative.  Negative for appetite change, diaphoresis and fatigue.  HENT: Positive for hearing loss. Negative for ear pain, nosebleeds, sinus pressure, sneezing, sore throat, tinnitus and trouble swallowing.   Eyes: Negative.   Respiratory: Negative for cough, chest tightness, shortness of breath and stridor.   Cardiovascular: Negative for chest pain, palpitations and leg swelling.  Gastrointestinal: Negative for abdominal pain, constipation, diarrhea and nausea.  Endocrine: Negative.   Genitourinary: Negative.  Negative for difficulty urinating.  Musculoskeletal: Negative.   Skin: Negative.   Neurological: Negative.  Negative for dizziness and weakness.  Hematological: Negative for adenopathy.  Does not bruise/bleed easily.  Psychiatric/Behavioral: Negative.     Objective:  BP 132/86   Pulse 89   Temp 98.3 F (36.8 C) (Oral)   Resp 16   Ht $R'5\' 9"'kH$  (1.753 m)   Wt 258 lb (117 kg)   SpO2 96%   BMI 38.10 kg/m   BP Readings from Last 3 Encounters:  06/02/20 132/86  05/16/20 130/72  03/08/20 (!) 164/90    Wt Readings from Last 3 Encounters:  06/02/20 258 lb (117 kg)  05/16/20 261 lb 9.6 oz (118.7 kg)  03/08/20 (!) 258 lb 8 oz (117.3 kg)     Physical Exam Vitals reviewed.  Constitutional:      General: He is not in acute distress.    Appearance: Normal appearance. He is obese. He is not ill-appearing, toxic-appearing or diaphoretic.  HENT:     Right Ear: Tympanic membrane and external ear normal. Decreased hearing noted. There is impacted cerumen. No foreign body.     Left Ear: Tympanic membrane and external ear normal. Decreased hearing noted. There is impacted cerumen. No foreign body.     Ears:     Comments: I put Colace in his left ear and then irrigated it with water and used an ear pick to remove the cerumen.  The hearing on the left side has returned to normal.  He elected not to have the cerumen on the right side removed. He tolerated this well.    Nose: Nose normal.     Mouth/Throat:     Mouth: Mucous membranes are moist.  Eyes:     General: No scleral icterus.    Conjunctiva/sclera: Conjunctivae normal.  Cardiovascular:     Rate and Rhythm: Normal rate and regular rhythm.     Heart sounds: No murmur heard.   Pulmonary:     Effort: Pulmonary effort is normal.     Breath sounds: No stridor. No wheezing, rhonchi or rales.  Abdominal:     General: Abdomen is flat.     Palpations: There is no mass.     Tenderness: There is no abdominal tenderness. There is no guarding.  Musculoskeletal:        General: Normal range of motion.     Cervical back: Neck supple.     Right lower leg: No edema.     Left lower leg: No edema.  Lymphadenopathy:     Cervical: No cervical adenopathy.  Skin:    General: Skin is warm and dry.     Coloration: Skin is not pale.  Neurological:     General: No focal deficit present.     Mental Status: He is alert.  Psychiatric:        Mood and Affect: Mood normal.        Behavior: Behavior normal.     Lab Results  Component Value Date   WBC 13.1 (H) 03/08/2020   HGB 14.9 03/08/2020   HCT 47.5 03/08/2020   PLT 222 03/08/2020   GLUCOSE 272 (H) 03/08/2020   CHOL 219 (H)  03/08/2020   TRIG 155 (H) 03/08/2020   HDL 40 03/08/2020   LDLCALC 150 (H) 03/08/2020   ALT 31 03/08/2020   AST 24 03/08/2020   NA 137 03/08/2020   K 4.2 03/08/2020   CL 99 03/08/2020   CREATININE 1.39 (H) 03/08/2020   BUN 24 03/08/2020   CO2 26 03/08/2020   TSH 0.79 03/08/2020   PSA 4.6 03/08/2020   HGBA1C 9.0 (A) 03/08/2020   MICROALBUR  2.5 03/08/2020    CT CHEST W CONTRAST  Result Date: 01/04/2017 CLINICAL DATA:  Follow-up right upper lobe lung mass. History of COPD. No known tissue sampling of the mass . EXAM: CT CHEST WITH CONTRAST TECHNIQUE: Multidetector CT imaging of the chest was performed during intravenous contrast administration. CONTRAST:  1mL ISOVUE-300 IOPAMIDOL (ISOVUE-300) INJECTION 61% COMPARISON:  02/27/2016 chest radiograph. 03/22/2014 PET-CT. 02/18/2014 chest CT. FINDINGS: Cardiovascular: Normal heart size. No significant pericardial fluid/thickening. Left anterior descending coronary atherosclerosis. Atherosclerotic nonaneurysmal thoracic aorta. Normal caliber pulmonary arteries. No central pulmonary emboli. Mediastinum/Nodes: No discrete thyroid nodules. Unremarkable esophagus. No pathologically enlarged axillary, mediastinal or hilar lymph nodes. Lungs/Pleura: No pneumothorax. No pleural effusion. Moderate to severe centrilobular emphysema with mild diffuse bronchial wall thickening. Irregular 3.7 x 2.7 cm right upper lobe focus of consolidation (series 3/ image 43), decreased from 4.6 x 3.1 cm on 02/18/2014 chest CT. Nodular subpleural 1.4 x 1.3 cm opacity in the medial right upper lobe (series 3/ image 47) is decreased from 1.8 x 1.6 cm on 02/18/2014. Stable solid subpleural 5 mm pulmonary nodule associated with the minor fissure (series 3/ image 72), considered benign. No acute consolidative airspace disease or new significant pulmonary nodules. Upper abdomen: Hypodense 2.4 cm medial splenic focus (series 2/ image 150), stable since 02/18/2014, suggesting a benign  lesion. Stable appearance of the adrenal glands with irregular mild bilateral adrenal thickening, without discrete adrenal nodules, suggesting adrenal hyperplasia. Musculoskeletal: No aggressive appearing focal osseous lesions. Moderate thoracic spondylosis. IMPRESSION: 1. Irregular 3.7 cm masslike focus of consolidation in the right upper lobe, decreased from 4.6 cm on 02/18/2014 chest CT. Separate smaller subpleural nodular opacity in the medial right upper lobe, also decreased. These findings are most suggestive of foci of postinfectious/postinflammatory scarring, although continued chest CT surveillance is warranted in 1 year. 2. No acute pulmonary disease. No thoracic adenopathy. 3. Moderate to severe emphysema with mild diffuse bronchial wall thickening, compatible with the provided history of COPD. 4. Aortic atherosclerosis.  One vessel coronary atherosclerosis. Electronically Signed   By: Ilona Sorrel M.D.   On: 01/04/2017 15:01    Assessment & Plan:   Jesse Garrison was seen today for diabetes.  Diagnoses and all orders for this visit:  Hearing loss due to cerumen impaction, bilateral- Hearing improved after the cerumen was removed from the left EAC.  Need for shingles vaccine -     Zoster Vaccine Adjuvanted [facility]) injection; Inject 0.5 mLs into the muscle once for 1 dose.  Type 2 diabetes mellitus with complication, without long-term current use of insulin ([place])- He cannot afford Soliqua.  I have asked him to start using a GLP-1 agonist in addition to the Metformin. -     Semaglutide,0.25 or 0.5MG /DOS, (OZEMPIC, 0.25 OR 0.5 MG/DOSE,) 2 MG/1.5ML SOPN; Inject 0.25 mg into the skin once a week.  Diabetes mellitus type 2 with complications ([place])- See above. -     Semaglutide,0.25 or 0.5MG /DOS, (OZEMPIC, 0.25 OR 0.5 MG/DOSE,) 2 MG/1.5ML SOPN; Inject 0.25 mg into the skin once a week.  Essential hypertension- His blood pressure is adequately well controlled.   I have discontinued Jesse Garrison. I am also having him start on Shingrix and Ozempic (0.25 or 0.5 MG/DOSE). Additionally, I am having him maintain his latanoprost, pravastatin, FreeStyle Libre 14 Day Sensor, [employer] 14 Day Reader, NovoFine, amLODipine, Vitamin D, losartan, ONE TOUCH ULTRA MINI, glucose blood, onetouch ultrasoft, Anoro Ellipta, levalbuterol, metFORMIN, and allopurinol.  Meds ordered this encounter  Medications  . Zoster  Vaccine Adjuvanted [facility]) injection    Sig: Inject 0.5 mLs into the muscle once for 1 dose.    Dispense:  0.5 mL    Refill:  1  . Semaglutide,0.25 or 0.5MG /DOS, (OZEMPIC, 0.25 OR 0.5 MG/DOSE,) 2 MG/1.5ML SOPN    Sig: Inject 0.25 mg into the skin once a week.    Dispense:  3 mL    Refill:  0     Follow-up: Return in about 3 months (around 09/02/2020).  Scarlette Calico, MD

## 2020-06-06 ENCOUNTER — Telehealth: Payer: Self-pay | Admitting: Internal Medicine

## 2020-06-06 NOTE — Telephone Encounter (Signed)
    [employer] calling ,states they are faxing a form for completion for glucose monitor

## 2020-06-10 NOTE — Telephone Encounter (Signed)
   [employer] calling to request completion of form for Continuous Blood Glucose Monitor  Also call from patient requesting forms be completed. Discussed with patient the validity of the company requesting form completion.  Patient states he wants to use the company

## 2020-06-15 NOTE — Telephone Encounter (Signed)
Denied. Please see multiple other telephone encounters in regard.

## 2020-06-15 NOTE — Telephone Encounter (Signed)
Jesse Garrison [phone]  Fax: [phone] or [phone]

## 2020-06-16 ENCOUNTER — Ambulatory Visit: Payer: Medicare (Managed Care) | Admitting: Dietician

## 2020-07-01 ENCOUNTER — Telehealth: Payer: Self-pay | Admitting: Internal Medicine

## 2020-07-01 NOTE — Telephone Encounter (Signed)
Form was received and given to PCP for completion.

## 2020-07-01 NOTE — Telephone Encounter (Signed)
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 Fax# [phone]

## 2020-07-03 ENCOUNTER — Other Ambulatory Visit: Payer: Self-pay | Admitting: Internal Medicine

## 2020-07-04 NOTE — Telephone Encounter (Signed)
I have spoken to the pt and informed her that the weekly injection of the Ozempic is not an insulin per PCP. He expressed understanding and stated that he would let the DOT know he was mistaken of the medicaiton.

## 2020-07-11 ENCOUNTER — Other Ambulatory Visit: Payer: Self-pay | Admitting: Internal Medicine

## 2020-07-11 DIAGNOSIS — E118 Type 2 diabetes mellitus with unspecified complications: Secondary | ICD-10-CM

## 2020-07-11 MED ORDER — METFORMIN HCL ER 750 MG PO TB24: 1500.0000 mg | ORAL_TABLET | Freq: Every day | ORAL | 0 refills | Status: DC

## 2020-07-27 ENCOUNTER — Ambulatory Visit: Payer: Medicare (Managed Care) | Admitting: Pulmonary Disease

## 2020-08-28 ENCOUNTER — Other Ambulatory Visit: Payer: Self-pay | Admitting: Internal Medicine

## 2020-08-28 DIAGNOSIS — E118 Type 2 diabetes mellitus with unspecified complications: Secondary | ICD-10-CM

## 2020-08-28 DIAGNOSIS — E785 Hyperlipidemia, unspecified: Secondary | ICD-10-CM

## 2020-08-28 DIAGNOSIS — I10 Essential (primary) hypertension: Secondary | ICD-10-CM

## 2020-08-28 DIAGNOSIS — M1000 Idiopathic gout, unspecified site: Secondary | ICD-10-CM

## 2020-10-03 ENCOUNTER — Ambulatory Visit: Payer: Medicare (Managed Care) | Admitting: Pulmonary Disease

## 2020-10-05 ENCOUNTER — Other Ambulatory Visit: Payer: Self-pay | Admitting: Internal Medicine

## 2020-10-05 DIAGNOSIS — E118 Type 2 diabetes mellitus with unspecified complications: Secondary | ICD-10-CM
